# Patient Record
Sex: Male | Born: 1990 | Race: Black or African American | Hispanic: No | Marital: Single | Smoking: Never smoker
Health system: Southern US, Community
[De-identification: ages and names within clinical notes are randomized; demographics above are authoritative.]

## PROBLEM LIST (undated history)

## (undated) ENCOUNTER — Emergency Department (HOSPITAL_COMMUNITY): Admission: EM | Payer: 59 | Source: Home / Self Care

---
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
## 2013-07-24 ENCOUNTER — Emergency Department (HOSPITAL_COMMUNITY)
Admission: EM | Admit: 2013-07-24 | Discharge: 2013-07-24 | Disposition: A | Discharge status: Home / Self Care | Payer: 59 | Source: Home / Self Care | Attending: Family Medicine | Admitting: Family Medicine

## 2013-07-24 ENCOUNTER — Emergency Department (INDEPENDENT_AMBULATORY_CARE_PROVIDER_SITE_OTHER): Payer: 59

## 2013-07-24 ENCOUNTER — Encounter (HOSPITAL_COMMUNITY): Payer: Self-pay | Admitting: Emergency Medicine

## 2013-07-24 DIAGNOSIS — S93429A Sprain of deltoid ligament of unspecified ankle, initial encounter: Secondary | ICD-10-CM
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
## 2013-07-24 NOTE — Discharge Instructions (Signed)
Wear ankle support as needed for comfort, activity as tolerated. advil and crutches as needed,  see orthopedist if further problems.
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
## 2013-08-12 ENCOUNTER — Emergency Department (HOSPITAL_COMMUNITY)
Admission: EM | Admit: 2013-08-12 | Discharge: 2013-08-12 | Disposition: A | Discharge status: Home / Self Care | Payer: 59 | Attending: Emergency Medicine | Admitting: Emergency Medicine

## 2013-08-12 ENCOUNTER — Encounter (HOSPITAL_COMMUNITY): Payer: Self-pay | Admitting: Emergency Medicine

## 2013-08-12 DIAGNOSIS — IMO0001 Reserved for inherently not codable concepts without codable children: Secondary | ICD-10-CM

## 2013-08-12 DIAGNOSIS — Y929 Unspecified place or not applicable: Secondary | ICD-10-CM | POA: Insufficient documentation

## 2013-08-12 DIAGNOSIS — W261XXA Contact with sword or dagger, initial encounter: Secondary | ICD-10-CM

## 2013-08-12 DIAGNOSIS — Y93G1 Activity, food preparation and clean up: Secondary | ICD-10-CM | POA: Insufficient documentation

## 2013-08-12 DIAGNOSIS — W260XXA Contact with knife, initial encounter: Secondary | ICD-10-CM | POA: Insufficient documentation

## 2013-08-12 DIAGNOSIS — S61209A Unspecified open wound of unspecified finger without damage to nail, initial encounter: Secondary | ICD-10-CM | POA: Insufficient documentation

## 2013-08-12 NOTE — ED Notes (Signed)
PA-C Sanders at bed side to suture wound.
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
## 2013-08-12 NOTE — Discharge Instructions (Signed)
Keep sutures clean and dry. Follow up with [facility] in 1 week for suture removal. Return to the ED for new concerns.

## 2013-08-13 NOTE — ED Provider Notes (Signed)
Medical screening examination/treatment/procedure(s) were performed by non-physician practitioner and as supervising physician I was immediately available for consultation/collaboration.   EKG Interpretation None        Ezequiel Essex, MD 08/13/13 [phone]

## 2013-08-19 ENCOUNTER — Encounter (HOSPITAL_COMMUNITY): Payer: Self-pay | Admitting: Emergency Medicine

## 2013-08-19 ENCOUNTER — Emergency Department (INDEPENDENT_AMBULATORY_CARE_PROVIDER_SITE_OTHER)
Admission: EM | Admit: 2013-08-19 | Discharge: 2013-08-19 | Disposition: A | Discharge status: Home / Self Care | Payer: 59 | Source: Home / Self Care | Attending: Emergency Medicine | Admitting: Emergency Medicine

## 2013-08-19 DIAGNOSIS — T148XXA Other injury of unspecified body region, initial encounter: Principal | ICD-10-CM

## 2013-08-19 DIAGNOSIS — L089 Local infection of the skin and subcutaneous tissue, unspecified: Secondary | ICD-10-CM

## 2013-08-19 DIAGNOSIS — X58XXXA Exposure to other specified factors, initial encounter: Secondary | ICD-10-CM

## 2013-08-19 DIAGNOSIS — T07XXXA Unspecified multiple injuries, initial encounter: Secondary | ICD-10-CM

## 2013-08-19 MED ORDER — AMOXICILLIN-POT CLAVULANATE 875-125 MG PO TABS: 1.0000 | ORAL_TABLET | Freq: Two times a day (BID) | ORAL | Status: DC

## 2013-08-19 MED ORDER — MUPIROCIN 2 % EX OINT: 1.0000 "application " | TOPICAL_OINTMENT | Freq: Three times a day (TID) | CUTANEOUS | Status: DC

## 2013-08-19 MED ORDER — HYDROCODONE-ACETAMINOPHEN 5-325 MG PO TABS: ORAL_TABLET | ORAL | Status: DC
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
## 2013-08-19 NOTE — Discharge Instructions (Signed)
Wash with soap and water twice daily.  Apply antibiotic ointment and non-stick dressing.  Watch for signs of worsening infection.

## 2013-08-22 LAB — WOUND CULTURE
CULTURE: NO GROWTH
Special Requests: NORMAL

## 2014-09-02 ENCOUNTER — Emergency Department (HOSPITAL_COMMUNITY): Payer: 59

## 2014-09-02 ENCOUNTER — Emergency Department (HOSPITAL_COMMUNITY)
Admission: EM | Admit: 2014-09-02 | Discharge: 2014-09-02 | Disposition: A | Discharge status: Home / Self Care | Payer: 59 | Attending: Emergency Medicine | Admitting: Emergency Medicine

## 2014-09-02 ENCOUNTER — Encounter (HOSPITAL_COMMUNITY): Payer: Self-pay | Admitting: Emergency Medicine

## 2014-09-02 DIAGNOSIS — S51801A Unspecified open wound of right forearm, initial encounter: Secondary | ICD-10-CM | POA: Insufficient documentation

## 2014-09-02 DIAGNOSIS — Y9289 Other specified places as the place of occurrence of the external cause: Secondary | ICD-10-CM | POA: Insufficient documentation

## 2014-09-02 DIAGNOSIS — Y939 Activity, unspecified: Secondary | ICD-10-CM | POA: Insufficient documentation

## 2014-09-02 DIAGNOSIS — W3400XA Accidental discharge from unspecified firearms or gun, initial encounter: Secondary | ICD-10-CM | POA: Diagnosis not present

## 2014-09-02 DIAGNOSIS — Y999 Unspecified external cause status: Secondary | ICD-10-CM | POA: Diagnosis not present

## 2014-09-02 DIAGNOSIS — S59911A Unspecified injury of right forearm, initial encounter: Secondary | ICD-10-CM | POA: Diagnosis present

## 2014-09-02 LAB — TYPE AND SCREEN
ABO/RH(D): A POS
Antibody Screen: NEGATIVE
Unit division: 0
Unit division: 0

## 2014-09-02 LAB — COMPREHENSIVE METABOLIC PANEL
ALBUMIN: 4 g/dL (ref 3.5–5.0)
ALT: 44 U/L (ref 17–63)
AST: 33 U/L (ref 15–41)
Alkaline Phosphatase: 50 U/L (ref 38–126)
Anion gap: 15 (ref 5–15)
BILIRUBIN TOTAL: 0.5 mg/dL (ref 0.3–1.2)
BUN: 13 mg/dL (ref 6–20)
CALCIUM: 9 mg/dL (ref 8.9–10.3)
CO2: 21 mmol/L — ABNORMAL LOW (ref 22–32)
Chloride: 103 mmol/L (ref 101–111)
Creatinine, Ser: 1.41 mg/dL — ABNORMAL HIGH (ref 0.61–1.24)
Glucose, Bld: 142 mg/dL — ABNORMAL HIGH (ref 65–99)
POTASSIUM: 3.8 mmol/L (ref 3.5–5.1)
Sodium: 139 mmol/L (ref 135–145)
Total Protein: 7.1 g/dL (ref 6.5–8.1)

## 2014-09-02 LAB — PROTIME-INR
INR: 1.03 (ref 0.00–1.49)
Prothrombin Time: 13.7 seconds (ref 11.6–15.2)

## 2014-09-02 LAB — ABO/RH: ABO/RH(D): A POS

## 2014-09-02 LAB — RAPID URINE DRUG SCREEN, HOSP PERFORMED
Amphetamines: NOT DETECTED
BENZODIAZEPINES: NOT DETECTED
Barbiturates: NOT DETECTED
Cocaine: NOT DETECTED
Opiates: NOT DETECTED
Tetrahydrocannabinol: NOT DETECTED

## 2014-09-02 LAB — PREPARE FRESH FROZEN PLASMA
UNIT DIVISION: 0
Unit division: 0

## 2014-09-02 LAB — URINALYSIS, ROUTINE W REFLEX MICROSCOPIC
Bilirubin Urine: NEGATIVE
Glucose, UA: NEGATIVE mg/dL
HGB URINE DIPSTICK: NEGATIVE
Ketones, ur: NEGATIVE mg/dL
Leukocytes, UA: NEGATIVE
NITRITE: NEGATIVE
PH: 5 (ref 5.0–8.0)
PROTEIN: 30 mg/dL — AB
Specific Gravity, Urine: 1.019 (ref 1.005–1.030)
Urobilinogen, UA: 0.2 mg/dL (ref 0.0–1.0)

## 2014-09-02 LAB — CBC
HCT: 40.4 % (ref 39.0–52.0)
Hemoglobin: 13.4 g/dL (ref 13.0–17.0)
MCH: 26.3 pg (ref 26.0–34.0)
MCHC: 33.2 g/dL (ref 30.0–36.0)
MCV: 79.4 fL (ref 78.0–100.0)
PLATELETS: 208 10*3/uL (ref 150–400)
RBC: 5.09 MIL/uL (ref 4.22–5.81)
RDW: 13.4 % (ref 11.5–15.5)
WBC: 6.7 10*3/uL (ref 4.0–10.5)

## 2014-09-02 LAB — URINE MICROSCOPIC-ADD ON

## 2014-09-02 LAB — ETHANOL: Alcohol, Ethyl (B): 150 mg/dL — ABNORMAL HIGH (ref <5)

## 2014-09-02 MED ORDER — SODIUM CHLORIDE 0.9 % IV BOLUS (SEPSIS)
1000.0000 mL | Freq: Once | INTRAVENOUS | Status: AC
  Administered 2014-09-02: 1000 mL via INTRAVENOUS

## 2014-09-02 MED ORDER — CEPHALEXIN 500 MG PO CAPS: 500.0000 mg | ORAL_CAPSULE | Freq: Four times a day (QID) | ORAL | Status: DC

## 2014-09-02 NOTE — ED Notes (Addendum)
[employer] at bedside 
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
## 2014-09-02 NOTE — ED Notes (Signed)
D/c ivs
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
## 2014-09-02 NOTE — ED Notes (Signed)
[place], Ortho tech at bedside placing splint on pt
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
## 2014-09-02 NOTE — ED Notes (Signed)
Patient is resting comfortably. 

## 2014-09-05 ENCOUNTER — Encounter (HOSPITAL_COMMUNITY): Payer: Self-pay | Admitting: Emergency Medicine

## 2014-11-25 ENCOUNTER — Inpatient Hospital Stay (HOSPITAL_COMMUNITY)
Admission: EM | Admit: 2014-11-25 | Discharge: 2014-12-27 | DRG: 907 | Disposition: A | Discharge status: Rehabilitation Facility | Payer: 59 | Attending: General Surgery | Admitting: General Surgery

## 2014-11-25 ENCOUNTER — Encounter (HOSPITAL_COMMUNITY): Payer: Self-pay | Admitting: Emergency Medicine

## 2014-11-25 ENCOUNTER — Inpatient Hospital Stay (HOSPITAL_COMMUNITY): Payer: 59

## 2014-11-25 ENCOUNTER — Emergency Department (HOSPITAL_COMMUNITY): Payer: 59

## 2014-11-25 ENCOUNTER — Emergency Department (HOSPITAL_COMMUNITY): Payer: 59 | Admitting: Certified Registered"

## 2014-11-25 ENCOUNTER — Encounter (HOSPITAL_COMMUNITY): Admission: EM | Disposition: A | Discharge status: Rehabilitation Facility | Payer: 59 | Source: Home / Self Care

## 2014-11-25 DIAGNOSIS — Y838 Other surgical procedures as the cause of abnormal reaction of the patient, or of later complication, without mention of misadventure at the time of the procedure: Secondary | ICD-10-CM | POA: Diagnosis not present

## 2014-11-25 DIAGNOSIS — B9689 Other specified bacterial agents as the cause of diseases classified elsewhere: Secondary | ICD-10-CM | POA: Diagnosis not present

## 2014-11-25 DIAGNOSIS — E669 Obesity, unspecified: Secondary | ICD-10-CM | POA: Diagnosis present

## 2014-11-25 DIAGNOSIS — E871 Hypo-osmolality and hyponatremia: Secondary | ICD-10-CM | POA: Diagnosis not present

## 2014-11-25 DIAGNOSIS — T79A0XA Compartment syndrome, unspecified, initial encounter: Secondary | ICD-10-CM | POA: Diagnosis not present

## 2014-11-25 DIAGNOSIS — J9690 Respiratory failure, unspecified, unspecified whether with hypoxia or hypercapnia: Secondary | ICD-10-CM

## 2014-11-25 DIAGNOSIS — R34 Anuria and oliguria: Secondary | ICD-10-CM | POA: Diagnosis not present

## 2014-11-25 DIAGNOSIS — N179 Acute kidney failure, unspecified: Secondary | ICD-10-CM | POA: Diagnosis not present

## 2014-11-25 DIAGNOSIS — R609 Edema, unspecified: Secondary | ICD-10-CM | POA: Diagnosis not present

## 2014-11-25 DIAGNOSIS — R821 Myoglobinuria: Secondary | ICD-10-CM | POA: Diagnosis not present

## 2014-11-25 DIAGNOSIS — K567 Ileus, unspecified: Secondary | ICD-10-CM | POA: Diagnosis not present

## 2014-11-25 DIAGNOSIS — I82611 Acute embolism and thrombosis of superficial veins of right upper extremity: Secondary | ICD-10-CM | POA: Diagnosis not present

## 2014-11-25 DIAGNOSIS — W3400XA Accidental discharge from unspecified firearms or gun, initial encounter: Secondary | ICD-10-CM | POA: Diagnosis not present

## 2014-11-25 DIAGNOSIS — T794XXA Traumatic shock, initial encounter: Secondary | ICD-10-CM | POA: Diagnosis present

## 2014-11-25 DIAGNOSIS — E873 Alkalosis: Secondary | ICD-10-CM | POA: Diagnosis present

## 2014-11-25 DIAGNOSIS — J9601 Acute respiratory failure with hypoxia: Secondary | ICD-10-CM | POA: Diagnosis present

## 2014-11-25 DIAGNOSIS — S32599A Other specified fracture of unspecified pubis, initial encounter for closed fracture: Secondary | ICD-10-CM | POA: Diagnosis present

## 2014-11-25 DIAGNOSIS — S75022A Major laceration of femoral artery, left leg, initial encounter: Secondary | ICD-10-CM | POA: Diagnosis present

## 2014-11-25 DIAGNOSIS — D62 Acute posthemorrhagic anemia: Secondary | ICD-10-CM | POA: Diagnosis present

## 2014-11-25 DIAGNOSIS — Z992 Dependence on renal dialysis: Secondary | ICD-10-CM | POA: Diagnosis not present

## 2014-11-25 DIAGNOSIS — N170 Acute kidney failure with tubular necrosis: Secondary | ICD-10-CM | POA: Diagnosis present

## 2014-11-25 DIAGNOSIS — R579 Shock, unspecified: Secondary | ICD-10-CM | POA: Diagnosis not present

## 2014-11-25 DIAGNOSIS — S75102A Unspecified injury of femoral vein at hip and thigh level, left leg, initial encounter: Secondary | ICD-10-CM | POA: Diagnosis present

## 2014-11-25 DIAGNOSIS — Y92481 Parking lot as the place of occurrence of the external cause: Secondary | ICD-10-CM | POA: Diagnosis not present

## 2014-11-25 DIAGNOSIS — S0181XA Laceration without foreign body of other part of head, initial encounter: Secondary | ICD-10-CM | POA: Diagnosis present

## 2014-11-25 DIAGNOSIS — T79A22A Traumatic compartment syndrome of left lower extremity, initial encounter: Secondary | ICD-10-CM | POA: Diagnosis not present

## 2014-11-25 DIAGNOSIS — R110 Nausea: Secondary | ICD-10-CM | POA: Diagnosis not present

## 2014-11-25 DIAGNOSIS — S299XXA Unspecified injury of thorax, initial encounter: Secondary | ICD-10-CM

## 2014-11-25 DIAGNOSIS — D696 Thrombocytopenia, unspecified: Secondary | ICD-10-CM | POA: Diagnosis present

## 2014-11-25 DIAGNOSIS — N186 End stage renal disease: Secondary | ICD-10-CM | POA: Diagnosis not present

## 2014-11-25 DIAGNOSIS — E876 Hypokalemia: Secondary | ICD-10-CM | POA: Diagnosis present

## 2014-11-25 DIAGNOSIS — E874 Mixed disorder of acid-base balance: Secondary | ICD-10-CM | POA: Diagnosis not present

## 2014-11-25 DIAGNOSIS — D638 Anemia in other chronic diseases classified elsewhere: Secondary | ICD-10-CM | POA: Diagnosis present

## 2014-11-25 DIAGNOSIS — Z95828 Presence of other vascular implants and grafts: Secondary | ICD-10-CM

## 2014-11-25 DIAGNOSIS — I9789 Other postprocedural complications and disorders of the circulatory system, not elsewhere classified: Secondary | ICD-10-CM | POA: Diagnosis not present

## 2014-11-25 DIAGNOSIS — Z419 Encounter for procedure for purposes other than remedying health state, unspecified: Secondary | ICD-10-CM

## 2014-11-25 DIAGNOSIS — S75122A Major laceration of femoral vein at hip and thigh level, left leg, initial encounter: Principal | ICD-10-CM | POA: Diagnosis present

## 2014-11-25 DIAGNOSIS — D6489 Other specified anemias: Secondary | ICD-10-CM | POA: Diagnosis present

## 2014-11-25 DIAGNOSIS — M6282 Rhabdomyolysis: Secondary | ICD-10-CM | POA: Diagnosis not present

## 2014-11-25 DIAGNOSIS — R000 Tachycardia, unspecified: Secondary | ICD-10-CM | POA: Diagnosis not present

## 2014-11-25 DIAGNOSIS — Z6823 Body mass index (BMI) 23.0-23.9, adult: Secondary | ICD-10-CM | POA: Diagnosis not present

## 2014-11-25 DIAGNOSIS — T82898A Other specified complication of vascular prosthetic devices, implants and grafts, initial encounter: Secondary | ICD-10-CM | POA: Diagnosis not present

## 2014-11-25 DIAGNOSIS — R509 Fever, unspecified: Secondary | ICD-10-CM

## 2014-11-25 DIAGNOSIS — S31139A Puncture wound of abdominal wall without foreign body, unspecified quadrant without penetration into peritoneal cavity, initial encounter: Secondary | ICD-10-CM

## 2014-11-25 DIAGNOSIS — R197 Diarrhea, unspecified: Secondary | ICD-10-CM | POA: Diagnosis not present

## 2014-11-25 DIAGNOSIS — S75009A Unspecified injury of femoral artery, unspecified leg, initial encounter: Secondary | ICD-10-CM | POA: Diagnosis present

## 2014-11-25 DIAGNOSIS — I10 Essential (primary) hypertension: Secondary | ICD-10-CM | POA: Diagnosis not present

## 2014-11-25 DIAGNOSIS — Z9289 Personal history of other medical treatment: Secondary | ICD-10-CM

## 2014-11-25 DIAGNOSIS — R7881 Bacteremia: Secondary | ICD-10-CM | POA: Diagnosis not present

## 2014-11-25 DIAGNOSIS — S31109D Unspecified open wound of abdominal wall, unspecified quadrant without penetration into peritoneal cavity, subsequent encounter: Secondary | ICD-10-CM | POA: Diagnosis not present

## 2014-11-25 DIAGNOSIS — Z89612 Acquired absence of left leg above knee: Secondary | ICD-10-CM | POA: Diagnosis not present

## 2014-11-25 DIAGNOSIS — A419 Sepsis, unspecified organism: Secondary | ICD-10-CM | POA: Diagnosis not present

## 2014-11-25 DIAGNOSIS — S31109S Unspecified open wound of abdominal wall, unspecified quadrant without penetration into peritoneal cavity, sequela: Secondary | ICD-10-CM | POA: Diagnosis not present

## 2014-11-25 DIAGNOSIS — W3400XS Accidental discharge from unspecified firearms or gun, sequela: Secondary | ICD-10-CM | POA: Diagnosis not present

## 2014-11-25 DIAGNOSIS — W3400XD Accidental discharge from unspecified firearms or gun, subsequent encounter: Secondary | ICD-10-CM | POA: Diagnosis not present

## 2014-11-25 DIAGNOSIS — N178 Other acute kidney failure: Secondary | ICD-10-CM | POA: Diagnosis not present

## 2014-11-25 DIAGNOSIS — Z978 Presence of other specified devices: Secondary | ICD-10-CM

## 2014-11-25 DIAGNOSIS — T827XXA Infection and inflammatory reaction due to other cardiac and vascular devices, implants and grafts, initial encounter: Secondary | ICD-10-CM | POA: Diagnosis not present

## 2014-11-25 DIAGNOSIS — R578 Other shock: Secondary | ICD-10-CM | POA: Diagnosis present

## 2014-11-25 HISTORY — PX: FEMORAL-FEMORAL BYPASS GRAFT: SHX936

## 2014-11-25 HISTORY — PX: FACIAL LACERATION REPAIR: SHX6589

## 2014-11-25 HISTORY — PX: FEMORAL ARTERY EXPLORATION: SHX5160

## 2014-11-25 LAB — I-STAT CHEM 8, ED
BUN: 10 mg/dL (ref 6–20)
Calcium, Ion: 1.17 mmol/L (ref 1.12–1.23)
Chloride: 109 mmol/L (ref 101–111)
Creatinine, Ser: 1.7 mg/dL — ABNORMAL HIGH (ref 0.61–1.24)
GLUCOSE: 238 mg/dL — AB (ref 65–99)
HEMATOCRIT: 41 % (ref 39.0–52.0)
HEMOGLOBIN: 13.9 g/dL (ref 13.0–17.0)
POTASSIUM: 4.1 mmol/L (ref 3.5–5.1)
SODIUM: 147 mmol/L — AB (ref 135–145)
TCO2: 9 mmol/L (ref 0–100)

## 2014-11-25 LAB — TYPE AND SCREEN
ABO/RH(D): A POS
Antibody Screen: NEGATIVE

## 2014-11-25 LAB — POCT I-STAT 3, ART BLOOD GAS (G3+)
Acid-Base Excess: 6 mmol/L — ABNORMAL HIGH (ref 0.0–2.0)
Acid-Base Excess: 6 mmol/L — ABNORMAL HIGH (ref 0.0–2.0)
BICARBONATE: 27.9 meq/L — AB (ref 20.0–24.0)
Bicarbonate: 29.1 meq/L — ABNORMAL HIGH (ref 20.0–24.0)
O2 Saturation: 100 %
O2 Saturation: 100 %
PCO2 ART: 33.8 mmHg — AB (ref 35.0–45.0)
PH ART: 7.542 — AB (ref 7.350–7.450)
PO2 ART: 151 mmHg — AB (ref 80.0–100.0)
Patient temperature: 98.6
TCO2: 29 mmol/L (ref 0–100)
TCO2: 30 mmol/L (ref 0–100)
pCO2 arterial: 30.1 mmHg — ABNORMAL LOW (ref 35.0–45.0)
pH, Arterial: 7.572 — ABNORMAL HIGH (ref 7.350–7.450)
pO2, Arterial: 319 mmHg — ABNORMAL HIGH (ref 80.0–100.0)

## 2014-11-25 LAB — POCT I-STAT 7, (LYTES, BLD GAS, ICA,H+H)
ACID-BASE DEFICIT: 17 mmol/L — AB (ref 0.0–2.0)
ACID-BASE DEFICIT: 9 mmol/L — AB (ref 0.0–2.0)
Acid-base deficit: 2 mmol/L (ref 0.0–2.0)
BICARBONATE: 11.6 meq/L — AB (ref 20.0–24.0)
BICARBONATE: 17.4 meq/L — AB (ref 20.0–24.0)
BICARBONATE: 21.9 meq/L (ref 20.0–24.0)
CALCIUM ION: 0.72 mmol/L — AB (ref 1.12–1.23)
CALCIUM ION: 0.87 mmol/L — AB (ref 1.12–1.23)
CALCIUM ION: 0.92 mmol/L — AB (ref 1.12–1.23)
HCT: 23 % — ABNORMAL LOW (ref 39.0–52.0)
HEMATOCRIT: 29 % — AB (ref 39.0–52.0)
HEMATOCRIT: 25 % — AB (ref 39.0–52.0)
Hemoglobin: 7.8 g/dL — ABNORMAL LOW (ref 13.0–17.0)
Hemoglobin: 9.9 g/dL — ABNORMAL LOW (ref 13.0–17.0)
Hemoglobin: 8.5 g/dL — ABNORMAL LOW (ref 13.0–17.0)
O2 SAT: 100 %
O2 SAT: 99 %
O2 Saturation: 100 %
PCO2 ART: 39 mmHg (ref 35.0–45.0)
PCO2 ART: 34.2 mmHg — AB (ref 35.0–45.0)
PH ART: 7.077 — AB (ref 7.350–7.450)
PH ART: 7.246 — AB (ref 7.350–7.450)
PO2 ART: 420 mmHg — AB (ref 80.0–100.0)
POTASSIUM: 5.4 mmol/L — AB (ref 3.5–5.1)
POTASSIUM: 3.5 mmol/L (ref 3.5–5.1)
Patient temperature: 36.4
Potassium: 5.1 mmol/L (ref 3.5–5.1)
SODIUM: 141 mmol/L (ref 135–145)
Sodium: 140 mmol/L (ref 135–145)
Sodium: 144 mmol/L (ref 135–145)
TCO2: 13 mmol/L (ref 0–100)
TCO2: 19 mmol/L (ref 0–100)
TCO2: 23 mmol/L (ref 0–100)
pCO2 arterial: 39.7 mmHg (ref 35.0–45.0)
pH, Arterial: 7.412 (ref 7.350–7.450)
pO2, Arterial: 424 mmHg — ABNORMAL HIGH (ref 80.0–100.0)
pO2, Arterial: 155 mmHg — ABNORMAL HIGH (ref 80.0–100.0)

## 2014-11-25 LAB — CBC
HCT: 33.6 % — ABNORMAL LOW (ref 39.0–52.0)
HCT: 33.9 % — ABNORMAL LOW (ref 39.0–52.0)
HEMATOCRIT: 40 % (ref 39.0–52.0)
HEMOGLOBIN: 11.7 g/dL — AB (ref 13.0–17.0)
HEMOGLOBIN: 11.9 g/dL — AB (ref 13.0–17.0)
HEMOGLOBIN: 12.3 g/dL — AB (ref 13.0–17.0)
MCH: 25.9 pg — ABNORMAL LOW (ref 26.0–34.0)
MCH: 29.8 pg (ref 26.0–34.0)
MCH: 30.1 pg (ref 26.0–34.0)
MCHC: 29.3 g/dL — ABNORMAL LOW (ref 30.0–36.0)
MCHC: 35.4 g/dL (ref 30.0–36.0)
MCHC: 36.3 g/dL — ABNORMAL HIGH (ref 30.0–36.0)
MCV: 88.5 fL (ref 78.0–100.0)
MCV: 84 fL (ref 78.0–100.0)
MCV: 82.9 fL (ref 78.0–100.0)
PLATELETS: 147 10*3/uL — AB (ref 150–400)
Platelets: 73 10*3/uL — ABNORMAL LOW (ref 150–400)
Platelets: 66 10*3/uL — ABNORMAL LOW (ref 150–400)
RBC: 4.52 MIL/uL (ref 4.22–5.81)
RBC: 4 MIL/uL — AB (ref 4.22–5.81)
RBC: 4.09 MIL/uL — AB (ref 4.22–5.81)
RDW: 14 % (ref 11.5–15.5)
RDW: 13.9 % (ref 11.5–15.5)
RDW: 13.9 % (ref 11.5–15.5)
WBC: 5.3 10*3/uL (ref 4.0–10.5)
WBC: 12.6 10*3/uL — ABNORMAL HIGH (ref 4.0–10.5)
WBC: 11.4 10*3/uL — ABNORMAL HIGH (ref 4.0–10.5)

## 2014-11-25 LAB — BASIC METABOLIC PANEL
Anion gap: 14 (ref 5–15)
BUN: 11 mg/dL (ref 6–20)
CHLORIDE: 105 mmol/L (ref 101–111)
CO2: 23 mmol/L (ref 22–32)
CREATININE: 2.34 mg/dL — AB (ref 0.61–1.24)
Calcium: 8.7 mg/dL — ABNORMAL LOW (ref 8.9–10.3)
GFR calc Af Amer: 43 mL/min — ABNORMAL LOW (ref >60)
GFR calc non Af Amer: 38 mL/min — ABNORMAL LOW (ref >60)
Glucose, Bld: 85 mg/dL (ref 65–99)
POTASSIUM: 3.6 mmol/L (ref 3.5–5.1)
Sodium: 142 mmol/L (ref 135–145)

## 2014-11-25 LAB — DIC (DISSEMINATED INTRAVASCULAR COAGULATION)PANEL
INR: 2.2 — ABNORMAL HIGH (ref 0.00–1.49)
aPTT: 97 seconds — ABNORMAL HIGH (ref 24–37)

## 2014-11-25 LAB — DIC (DISSEMINATED INTRAVASCULAR COAGULATION) PANEL
FIBRINOGEN: 113 mg/dL — AB (ref 204–475)
PLATELETS: 106 10*3/uL — AB (ref 150–400)
PROTHROMBIN TIME: 24.2 s — AB (ref 11.6–15.2)
SMEAR REVIEW: NONE SEEN

## 2014-11-25 LAB — COMPREHENSIVE METABOLIC PANEL
ALT: 31 U/L (ref 17–63)
ANION GAP: 34 — AB (ref 5–15)
AST: 56 U/L — ABNORMAL HIGH (ref 15–41)
Albumin: 3.5 g/dL (ref 3.5–5.0)
Alkaline Phosphatase: 45 U/L (ref 38–126)
BUN: 10 mg/dL (ref 6–20)
CHLORIDE: 108 mmol/L (ref 101–111)
CO2: 8 mmol/L — ABNORMAL LOW (ref 22–32)
CREATININE: 1.84 mg/dL — AB (ref 0.61–1.24)
Calcium: 10 mg/dL (ref 8.9–10.3)
GFR, EST AFRICAN AMERICAN: 58 mL/min — AB (ref >60)
GFR, EST NON AFRICAN AMERICAN: 50 mL/min — AB (ref >60)
Glucose, Bld: 251 mg/dL — ABNORMAL HIGH (ref 65–99)
POTASSIUM: 4.3 mmol/L (ref 3.5–5.1)
SODIUM: 150 mmol/L — AB (ref 135–145)
Total Bilirubin: 0.4 mg/dL (ref 0.3–1.2)
Total Protein: 5.8 g/dL — ABNORMAL LOW (ref 6.5–8.1)

## 2014-11-25 LAB — ETHANOL: Alcohol, Ethyl (B): 69 mg/dL — ABNORMAL HIGH (ref <5)

## 2014-11-25 LAB — MASSIVE TRANSFUSION PROTOCOL ORDER (BLOOD BANK NOTIFICATION)

## 2014-11-25 LAB — PROTIME-INR
INR: 2.03 — AB (ref 0.00–1.49)
INR: 1.38 (ref 0.00–1.49)
Prothrombin Time: 22.8 seconds — ABNORMAL HIGH (ref 11.6–15.2)
Prothrombin Time: 17 seconds — ABNORMAL HIGH (ref 11.6–15.2)

## 2014-11-25 LAB — MRSA PCR SCREENING: MRSA by PCR: NEGATIVE

## 2014-11-25 LAB — CDS SEROLOGY

## 2014-11-25 LAB — ABO/RH: ABO/RH(D): A POS

## 2014-11-25 LAB — TRIGLYCERIDES: Triglycerides: 75 mg/dL (ref <150)

## 2014-11-25 SURGERY — CREATION, BYPASS, ARTERIAL, FEMORAL TO FEMORAL, USING GRAFT
Anesthesia: General | Site: Leg Upper

## 2014-11-25 MED ORDER — MIDAZOLAM HCL 2 MG/2ML IJ SOLN
INTRAMUSCULAR | Status: AC
  Filled 2014-11-25: qty 4

## 2014-11-25 MED ORDER — ROCURONIUM BROMIDE 50 MG/5ML IV SOLN
INTRAVENOUS | Status: AC
  Filled 2014-11-25: qty 4

## 2014-11-25 MED ORDER — ANTISEPTIC ORAL RINSE SOLUTION (CORINZ)
7.0000 mL | Freq: Four times a day (QID) | OROMUCOSAL | Status: DC
  Administered 2014-11-25 – 2014-11-26 (×4): 7 mL via OROMUCOSAL

## 2014-11-25 MED ORDER — ROCURONIUM BROMIDE 50 MG/5ML IV SOLN
INTRAVENOUS | Status: AC
  Filled 2014-11-25: qty 1

## 2014-11-25 MED ORDER — LIDOCAINE HCL (CARDIAC) 20 MG/ML IV SOLN
INTRAVENOUS | Status: AC
  Filled 2014-11-25: qty 5

## 2014-11-25 MED ORDER — SODIUM CHLORIDE 0.9 % IV SOLN
25.0000 ug/h | INTRAVENOUS | Status: DC
  Administered 2014-11-25: 300 ug/h via INTRAVENOUS
  Administered 2014-11-25: 200 ug/h via INTRAVENOUS
  Administered 2014-11-26 – 2014-11-28 (×8): 300 ug/h via INTRAVENOUS
  Administered 2014-11-29 (×2): 175 ug/h via INTRAVENOUS
  Administered 2014-11-29: 350 ug/h via INTRAVENOUS
  Administered 2014-11-30: 250 ug/h via INTRAVENOUS
  Administered 2014-11-30: 125 ug/h via INTRAVENOUS
  Administered 2014-12-01 – 2014-12-02 (×3): 250 ug/h via INTRAVENOUS
  Administered 2014-12-03: 200 ug/h via INTRAVENOUS
  Administered 2014-12-03: 250 ug/h via INTRAVENOUS
  Administered 2014-12-03: 200 ug/h via INTRAVENOUS
  Administered 2014-12-04: 250 ug/h via INTRAVENOUS
  Administered 2014-12-04: 300 ug/h via INTRAVENOUS
  Administered 2014-12-05 – 2014-12-07 (×8): 400 ug/h via INTRAVENOUS
  Administered 2014-12-07: 300 ug/h via INTRAVENOUS
  Administered 2014-12-08 – 2014-12-09 (×4): 400 ug/h via INTRAVENOUS
  Filled 2014-11-25 (×41): qty 50

## 2014-11-25 MED ORDER — SUCCINYLCHOLINE CHLORIDE 20 MG/ML IJ SOLN
INTRAMUSCULAR | Status: AC | PRN
  Administered 2014-11-25: 100 mg via INTRAVENOUS

## 2014-11-25 MED ORDER — ONDANSETRON HCL 4 MG PO TABS: 4.0000 mg | ORAL_TABLET | Freq: Four times a day (QID) | ORAL | Status: DC | PRN

## 2014-11-25 MED ORDER — FENTANYL CITRATE (PF) 250 MCG/5ML IJ SOLN
INTRAMUSCULAR | Status: AC
  Filled 2014-11-25: qty 5

## 2014-11-25 MED ORDER — ETOMIDATE 2 MG/ML IV SOLN
INTRAVENOUS | Status: AC | PRN
  Administered 2014-11-25: 25 mg via INTRAVENOUS

## 2014-11-25 MED ORDER — CEFAZOLIN SODIUM-DEXTROSE 2-3 GM-% IV SOLR
INTRAVENOUS | Status: AC
  Filled 2014-11-25: qty 50

## 2014-11-25 MED ORDER — PANTOPRAZOLE SODIUM 40 MG IV SOLR
40.0000 mg | Freq: Every day | INTRAVENOUS | Status: DC
  Administered 2014-11-25 – 2014-12-12 (×17): 40 mg via INTRAVENOUS
  Filled 2014-11-25 (×20): qty 40

## 2014-11-25 MED ORDER — ALBUMIN HUMAN 5 % IV SOLN
INTRAVENOUS | Status: DC | PRN
  Administered 2014-11-25: 04:00:00 via INTRAVENOUS

## 2014-11-25 MED ORDER — ETOMIDATE 2 MG/ML IV SOLN
INTRAVENOUS | Status: AC
  Filled 2014-11-25: qty 20

## 2014-11-25 MED ORDER — SODIUM CHLORIDE 0.9 % IJ SOLN
INTRAMUSCULAR | Status: AC
  Filled 2014-11-25: qty 10

## 2014-11-25 MED ORDER — SODIUM CHLORIDE 0.9 % IV SOLN
INTRAVENOUS | Status: DC | PRN
  Administered 2014-11-25 (×5): via INTRAVENOUS

## 2014-11-25 MED ORDER — CEFAZOLIN SODIUM-DEXTROSE 2-3 GM-% IV SOLR
INTRAVENOUS | Status: DC | PRN
  Administered 2014-11-25 (×2): 2 g via INTRAVENOUS

## 2014-11-25 MED ORDER — CALCIUM CHLORIDE 10 % IV SOLN
INTRAVENOUS | Status: DC | PRN
  Administered 2014-11-25 (×3): 1 g via INTRAVENOUS

## 2014-11-25 MED ORDER — PROTAMINE SULFATE 10 MG/ML IV SOLN
INTRAVENOUS | Status: DC | PRN
  Administered 2014-11-25 (×5): 10 mg via INTRAVENOUS

## 2014-11-25 MED ORDER — SODIUM CHLORIDE 0.9 % IV SOLN
INTRAVENOUS | Status: AC | PRN
  Administered 2014-11-25 (×2): 999 mL/h via INTRAVENOUS

## 2014-11-25 MED ORDER — CHLORHEXIDINE GLUCONATE 0.12% ORAL RINSE (MEDLINE KIT)
15.0000 mL | Freq: Two times a day (BID) | OROMUCOSAL | Status: DC
  Administered 2014-11-25 – 2014-12-13 (×35): 15 mL via OROMUCOSAL

## 2014-11-25 MED ORDER — BISACODYL 10 MG RE SUPP: 10.0000 mg | Freq: Every day | RECTAL | Status: DC | PRN

## 2014-11-25 MED ORDER — ONDANSETRON HCL 4 MG/2ML IJ SOLN
4.0000 mg | Freq: Four times a day (QID) | INTRAMUSCULAR | Status: DC | PRN
  Administered 2014-11-29 – 2014-12-19 (×10): 4 mg via INTRAVENOUS
  Filled 2014-11-25 (×10): qty 2

## 2014-11-25 MED ORDER — SUCCINYLCHOLINE CHLORIDE 20 MG/ML IJ SOLN
INTRAMUSCULAR | Status: AC
  Filled 2014-11-25: qty 1

## 2014-11-25 MED ORDER — THROMBIN 20000 UNITS EX SOLR
CUTANEOUS | Status: AC
  Filled 2014-11-25: qty 20000

## 2014-11-25 MED ORDER — FENTANYL BOLUS VIA INFUSION
50.0000 ug | INTRAVENOUS | Status: DC | PRN
  Administered 2014-11-29 – 2014-11-30 (×3): 50 ug via INTRAVENOUS
  Administered 2014-11-30: 100 ug via INTRAVENOUS
  Administered 2014-12-02 – 2014-12-04 (×3): 50 ug via INTRAVENOUS
  Administered 2014-12-04: 100 ug via INTRAVENOUS
  Administered 2014-12-04: 150 ug via INTRAVENOUS
  Administered 2014-12-07 – 2014-12-08 (×3): 50 ug via INTRAVENOUS
  Filled 2014-11-25 (×3): qty 50

## 2014-11-25 MED ORDER — DEXTROSE IN LACTATED RINGERS 5 % IV SOLN
INTRAVENOUS | Status: DC
  Administered 2014-11-25 (×2): via INTRAVENOUS

## 2014-11-25 MED ORDER — ARTIFICIAL TEARS OP OINT
TOPICAL_OINTMENT | OPHTHALMIC | Status: DC | PRN
  Administered 2014-11-25: 1 via OPHTHALMIC

## 2014-11-25 MED ORDER — PHENYLEPHRINE HCL 10 MG/ML IJ SOLN
10.0000 mg | INTRAVENOUS | Status: DC | PRN
  Administered 2014-11-25: 50 ug/min via INTRAVENOUS

## 2014-11-25 MED ORDER — CHLORHEXIDINE GLUCONATE 0.12% ORAL RINSE (MEDLINE KIT)
15.0000 mL | Freq: Two times a day (BID) | OROMUCOSAL | Status: DC
Start: 2014-11-25 — End: 2014-11-26
  Administered 2014-11-25 – 2014-11-26 (×2): 15 mL via OROMUCOSAL

## 2014-11-25 MED ORDER — 0.9 % SODIUM CHLORIDE (POUR BTL) OPTIME
TOPICAL | Status: DC | PRN
  Administered 2014-11-25: 2000 mL
  Administered 2014-11-25: 1000 mL

## 2014-11-25 MED ORDER — PROPOFOL 10 MG/ML IV BOLUS
INTRAVENOUS | Status: AC
  Filled 2014-11-25: qty 20

## 2014-11-25 MED ORDER — HEPARIN SODIUM (PORCINE) 1000 UNIT/ML IJ SOLN
INTRAMUSCULAR | Status: DC | PRN
  Administered 2014-11-25: 10000 [IU] via INTRAVENOUS

## 2014-11-25 MED ORDER — PROPOFOL 1000 MG/100ML IV EMUL
0.0000 ug/kg/min | INTRAVENOUS | Status: DC
  Administered 2014-11-25: 30 ug/kg/min via INTRAVENOUS
  Administered 2014-11-25 – 2014-11-28 (×23): 50 ug/kg/min via INTRAVENOUS
  Filled 2014-11-25 (×24): qty 100

## 2014-11-25 MED ORDER — FENTANYL CITRATE (PF) 100 MCG/2ML IJ SOLN: 50.0000 ug | Freq: Once | INTRAMUSCULAR | Status: DC

## 2014-11-25 MED ORDER — SODIUM CHLORIDE 0.9 % IR SOLN
Status: DC | PRN
  Administered 2014-11-25: 500 mL

## 2014-11-25 MED ORDER — SODIUM BICARBONATE 8.4 % IV SOLN
INTRAVENOUS | Status: DC | PRN
  Administered 2014-11-25 (×2): 100 meq via INTRAVENOUS

## 2014-11-25 MED ORDER — ANTISEPTIC ORAL RINSE SOLUTION (CORINZ)
7.0000 mL | Freq: Four times a day (QID) | OROMUCOSAL | Status: DC
  Administered 2014-11-26 – 2014-11-30 (×20): 7 mL via OROMUCOSAL

## 2014-11-25 MED ORDER — DEXMEDETOMIDINE HCL IN NACL 200 MCG/50ML IV SOLN
INTRAVENOUS | Status: DC | PRN
  Administered 2014-11-25: 0.7 ug/kg/h via INTRAVENOUS

## 2014-11-25 MED ORDER — PANTOPRAZOLE SODIUM 40 MG PO TBEC
40.0000 mg | DELAYED_RELEASE_TABLET | Freq: Every day | ORAL | Status: DC
  Filled 2014-11-25: qty 1

## 2014-11-25 MED ORDER — LACTATED RINGERS IV SOLN
INTRAVENOUS | Status: DC | PRN
  Administered 2014-11-25 (×3): via INTRAVENOUS

## 2014-11-25 MED ORDER — ROCURONIUM BROMIDE 100 MG/10ML IV SOLN
INTRAVENOUS | Status: DC | PRN
  Administered 2014-11-25 (×4): 50 mg via INTRAVENOUS

## 2014-11-25 MED ORDER — FENTANYL CITRATE (PF) 100 MCG/2ML IJ SOLN
INTRAMUSCULAR | Status: DC | PRN
  Administered 2014-11-25 (×5): 50 ug via INTRAVENOUS

## 2014-11-25 MED ORDER — MIDAZOLAM HCL 5 MG/5ML IJ SOLN
INTRAMUSCULAR | Status: DC | PRN
  Administered 2014-11-25: 2 mg via INTRAVENOUS

## 2014-11-25 MED ORDER — EPHEDRINE SULFATE 50 MG/ML IJ SOLN
INTRAMUSCULAR | Status: AC
  Filled 2014-11-25: qty 1

## 2014-11-25 MED ORDER — ROCURONIUM BROMIDE 50 MG/5ML IV SOLN
INTRAVENOUS | Status: AC
  Filled 2014-11-25: qty 2

## 2014-11-25 MED ORDER — GELATIN ABSORBABLE MT POWD
OROMUCOSAL | Status: DC | PRN
  Administered 2014-11-25 (×3): 20 mL via TOPICAL

## 2014-11-25 SURGICAL SUPPLY — 86 items
BLADE SURG ROTATE 9660 (MISCELLANEOUS) IMPLANT
BOOT SUTURE AID YELLOW STND (SUTURE) ×3 IMPLANT
CANISTER SUCTION 2500CC (MISCELLANEOUS) ×5 IMPLANT
CANNULA VESSEL 3MM 2 BLNT TIP (CANNULA) ×6 IMPLANT
CHLORAPREP W/TINT 26ML (MISCELLANEOUS) ×5 IMPLANT
CLIP TI LARGE 6 (CLIP) ×6 IMPLANT
CLIP TI WIDE RED SMALL 24 (CLIP) ×6 IMPLANT
COVER MAYO STAND STRL (DRAPES) IMPLANT
COVER PROBE W GEL 5X96 (DRAPES) ×3 IMPLANT
COVER SURGICAL LIGHT HANDLE (MISCELLANEOUS) ×5 IMPLANT
DRAIN WOUND SNY 15 RND (WOUND CARE) ×6 IMPLANT
DRAPE LAPAROSCOPIC ABDOMINAL (DRAPES) ×5 IMPLANT
DRAPE ORTHO SPLIT 77X108 STRL (DRAPES) ×5
DRAPE PROXIMA HALF (DRAPES) IMPLANT
DRAPE SURG ORHT 6 SPLT 77X108 (DRAPES) ×1 IMPLANT
DRAPE UTILITY XL STRL (DRAPES) ×10 IMPLANT
DRAPE WARM FLUID 44X44 (DRAPE) ×5 IMPLANT
DRSG COVADERM 4X8 (GAUZE/BANDAGES/DRESSINGS) ×3 IMPLANT
DRSG OPSITE POSTOP 4X10 (GAUZE/BANDAGES/DRESSINGS) IMPLANT
DRSG OPSITE POSTOP 4X8 (GAUZE/BANDAGES/DRESSINGS) IMPLANT
ELECT BLADE 6.5 EXT (BLADE) IMPLANT
ELECT CAUTERY BLADE 6.4 (BLADE) ×10 IMPLANT
ELECT REM PT RETURN 9FT ADLT (ELECTROSURGICAL) ×5
ELECTRODE REM PT RTRN 9FT ADLT (ELECTROSURGICAL) ×3 IMPLANT
EVACUATOR SILICONE 100CC (DRAIN) ×6 IMPLANT
GAUZE SPONGE 4X4 16PLY XRAY LF (GAUZE/BANDAGES/DRESSINGS) ×3 IMPLANT
GLOVE BIOGEL M STRL SZ7.5 (GLOVE) ×5 IMPLANT
GLOVE BIOGEL PI IND STRL 8 (GLOVE) ×3 IMPLANT
GLOVE BIOGEL PI INDICATOR 8 (GLOVE) ×2
GOWN STRL REUS W/ TWL LRG LVL3 (GOWN DISPOSABLE) ×6 IMPLANT
GOWN STRL REUS W/ TWL XL LVL3 (GOWN DISPOSABLE) ×3 IMPLANT
GOWN STRL REUS W/TWL LRG LVL3 (GOWN DISPOSABLE) ×10
GOWN STRL REUS W/TWL XL LVL3 (GOWN DISPOSABLE) ×5
GRAFT PROPATEN THIN WALL 6X40 (Vascular Products) ×3 IMPLANT
KIT BASIN OR (CUSTOM PROCEDURE TRAY) ×5 IMPLANT
KIT ROOM TURNOVER OR (KITS) ×5 IMPLANT
LIGASURE IMPACT 36 18CM CVD LR (INSTRUMENTS) IMPLANT
LOOP VESSEL MAXI BLUE (MISCELLANEOUS) ×6 IMPLANT
LOOP VESSEL MINI RED (MISCELLANEOUS) ×6 IMPLANT
MANIFOLD NEPTUNE II (INSTRUMENTS) ×3 IMPLANT
MARKER SKIN DUAL TIP RULER LAB (MISCELLANEOUS) ×3 IMPLANT
NS IRRIG 1000ML POUR BTL (IV SOLUTION) ×10 IMPLANT
PACK GENERAL/GYN (CUSTOM PROCEDURE TRAY) ×5 IMPLANT
PAD ARMBOARD 7.5X6 YLW CONV (MISCELLANEOUS) ×5 IMPLANT
PENCIL BUTTON HOLSTER BLD 10FT (ELECTRODE) IMPLANT
SPECIMEN JAR LARGE (MISCELLANEOUS) IMPLANT
SPONGE GAUZE 4X4 12PLY STER LF (GAUZE/BANDAGES/DRESSINGS) ×3 IMPLANT
SPONGE LAP 18X18 X RAY DECT (DISPOSABLE) ×3 IMPLANT
SPONGE LAP 4X18 X RAY DECT (DISPOSABLE) ×6 IMPLANT
STAPLER VISISTAT 35W (STAPLE) ×5 IMPLANT
STOPCOCK 4 WAY LG BORE MALE ST (IV SETS) ×3 IMPLANT
SUCTION POOLE TIP (SUCTIONS) ×5 IMPLANT
SUT ETHILON 3 0 PS 1 (SUTURE) ×3 IMPLANT
SUT GORETEX 6.0 TT13 (SUTURE) ×6 IMPLANT
SUT PDS AB 1 TP1 96 (SUTURE) ×10 IMPLANT
SUT PROLENE 5 0 C 1 24 (SUTURE) ×3 IMPLANT
SUT PROLENE 5 0 CC1 (SUTURE) ×3 IMPLANT
SUT PROLENE 6 0 BV (SUTURE) ×12 IMPLANT
SUT PROLENE 6 0 C 1 30 (SUTURE) ×3 IMPLANT
SUT PROLENE 7 0 BV1 MDA (SUTURE) ×3 IMPLANT
SUT SILK 2 0 (SUTURE) ×5
SUT SILK 2 0 SH CR/8 (SUTURE) ×5 IMPLANT
SUT SILK 2 0 TIES 10X30 (SUTURE) ×5 IMPLANT
SUT SILK 2-0 18XBRD TIE 12 (SUTURE) ×1 IMPLANT
SUT SILK 3 0 (SUTURE) ×5
SUT SILK 3 0 SH CR/8 (SUTURE) ×5 IMPLANT
SUT SILK 3 0 TIES 10X30 (SUTURE) ×5 IMPLANT
SUT SILK 3-0 18XBRD TIE 12 (SUTURE) ×1 IMPLANT
SUT SILK 4 0 (SUTURE) ×5
SUT SILK 4-0 18XBRD TIE 12 (SUTURE) ×1 IMPLANT
SUT VIC AB 2-0 CT1 27 (SUTURE) ×10
SUT VIC AB 2-0 CT1 TAPERPNT 27 (SUTURE) ×2 IMPLANT
SUT VIC AB 3-0 SH 18 (SUTURE) IMPLANT
SUT VIC AB 3-0 SH 27 (SUTURE) ×15
SUT VIC AB 3-0 SH 27X BRD (SUTURE) ×3 IMPLANT
SUT VICRYL 0 UR6 27IN ABS (SUTURE) ×9 IMPLANT
SYR 20CC LL (SYRINGE) ×6 IMPLANT
SYR 3ML LL SCALE MARK (SYRINGE) ×3 IMPLANT
SYRINGE 20CC LL (MISCELLANEOUS) IMPLANT
TAPE CLOTH SURG 4X10 WHT LF (GAUZE/BANDAGES/DRESSINGS) ×3 IMPLANT
TOWEL OR 17X26 10 PK STRL BLUE (TOWEL DISPOSABLE) ×5 IMPLANT
TRAY FOLEY CATH 16FRSI W/METER (SET/KITS/TRAYS/PACK) IMPLANT
TUBE CONNECTING 12'X1/4 (SUCTIONS)
TUBE CONNECTING 12X1/4 (SUCTIONS) IMPLANT
WATER STERILE IRR 1000ML POUR (IV SOLUTION) ×6 IMPLANT
YANKAUER SUCT BULB TIP NO VENT (SUCTIONS) IMPLANT

## 2014-11-25 NOTE — ED Notes (Signed)
1st unit PRBC infusing at right ac IV .
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
## 2014-11-25 NOTE — Progress Notes (Signed)
   11/25/14 1400  Clinical Encounter Type  Visited With Family  Visit Type Spiritual support  Referral From Chaplain  Recommendations Followup with family 9/6  Spiritual Encounters  Spiritual Needs Emotional  Stress Factors  Patient Stress Factors None identified  Family Stress Factors Not reviewed  Visited primarily with father regarding son's circumstances, provided spiritual and emotional support. Promised follow-up and prayer.
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
## 2014-11-25 NOTE — ED Notes (Signed)
Pt transported to OR

## 2014-11-25 NOTE — Progress Notes (Signed)
Patient ID: Caleb [name], male   DOB: Jan 17, 1991, 24 y.o.   MRN: [id number] Follow up - Trauma Critical Care  Patient Details:    Caleb [name] is an 24 y.o. male.  Lines/tubes : Airway 7.5 mm (Active)  Secured at (cm) 25 cm 11/25/2014 11:02 AM  Measured From Lips 11/25/2014 11:02 AM  Secured Location Left 11/25/2014 11:02 AM  Secured By [employer] 11/25/2014 11:02 AM  Tube Holder Repositioned Yes 11/25/2014 11:02 AM  Site Condition Dry 11/25/2014 11:02 AM     CVC Triple Lumen 11/25/14 Left Subclavian (Active)  Site Assessment Clean;Dry;Intact 11/25/2014  3:26 AM  Medial Flushed 11/25/2014  3:26 AM  Distal Lumen Status Flushed 11/25/2014  3:26 AM  Dressing Type Occlusive 11/25/2014  3:26 AM     Arterial Line 11/25/14 Left Radial (Active)     Closed System Drain 1 Left Groin Bulb (JP) 15 Fr. (Active)  Output (mL) 60 mL 11/25/2014 11:30 AM     Closed System Drain (Active)     Urethral Catheter C.Simmons,RN Latex;Straight-tip 16 Fr. (Active)  Output (mL) 240 mL 11/25/2014 10:28 AM    Microbiology/Sepsis markers: No results found for this or any previous visit.  Anti-infectives:  Anti-infectives    None      Best Practice/Protocols:   Continous Sedation  Consults:      Studies:    Events:  Subjective:    Overnight Issues:   Objective:  Vital signs for last 24 hours: Temp:  [97.9 F (36.6 C)] 97.9 F (36.6 C) (09/02 1115) Pulse Rate:  [67-127] 71 (09/02 1115) Resp:  [15-28] 18 (09/02 1115) BP: (57-166)/(0-103) 137/94 mmHg (09/02 1115) SpO2:  [87 %-100 %] 100 % (09/02 1115) Arterial Line BP: (142-160)/(87-106) 142/87 mmHg (09/02 1115) FiO2 (%):  [30 %-60 %] 30 % (09/02 1102) Weight:  [113.399 kg (250 lb)] 113.399 kg (250 lb) (09/02 0325)  Hemodynamic parameters for last 24 hours:    Intake/Output from previous day: 09/01 0701 - 09/02 0700 In: 16618 [I.V.:6250; Blood:10118; IV Piggyback:250] Out: 7400 [Urine:400; Blood:7000]  Intake/Output this  shift: Total I/O In: 3677.9 [I.V.:2557.9; Blood:1120] Out: 9735 [Urine:640; Drains:215; Blood:500]  Vent settings for last 24 hours: Vent Mode:  [-] PRVC FiO2 (%):  [30 %-60 %] 30 % Set Rate:  [16 bmp-20 bmp] 18 bmp Vt Set:  [500 mL-650 mL] 550 mL PEEP:  [5 cmH20] 5 cmH20 Plateau Pressure:  [17 cmH20-18 cmH20] 17 cmH20  Physical Exam:  General: on vent Neuro: sedated Resp: clear to auscultation bilaterally CVS: RRR GI: soft Extremities: significant edema LLE  Results for orders placed or performed during the hospital encounter of 11/25/14 (from the past 24 hour(s))  Prepare fresh frozen plasma     Status: None (Preliminary result)   Collection Time: 11/25/14  2:56 AM  Result Value Ref Range   Unit Number [id number]    Blood Component Type LIQ PLASMA    Unit division 00    Status of Unit ISSUED    Unit tag comment VERBAL ORDERS PER DR CAMPOS    Transfusion Status OK TO TRANSFUSE    Unit Number [id number]    Blood Component Type LIQ PLASMA    Unit division 00    Status of Unit ISSUED    Unit tag comment VERBAL ORDERS PER DR CAMPOS    Transfusion Status OK TO TRANSFUSE    Unit Number [id number]    Blood Component Type THAWED PLASMA    Unit division 00  Status of Unit ISSUED    Unit tag comment VERBAL ORDERS PER DR Redmond Pulling    Transfusion Status OK TO TRANSFUSE    Unit Number [id number]    Blood Component Type THAWED PLASMA    Unit division 00    Status of Unit ISSUED    Unit tag comment VERBAL ORDERS PER DR WILSON    Transfusion Status OK TO TRANSFUSE    Unit Number [id number]    Blood Component Type THWPLS APHR1    Unit division 00    Status of Unit ISSUED    Transfusion Status OK TO TRANSFUSE    Unit Number [id number]    Blood Component Type THWPLS APHR1    Unit division 00    Status of Unit ISSUED    Transfusion Status OK TO TRANSFUSE    Unit Number [id number]    Blood Component Type THAWED PLASMA    Unit division 00    Status of Unit  ISSUED    Transfusion Status OK TO TRANSFUSE    Unit Number [id number]    Blood Component Type THWPLS APHR2    Unit division 00    Status of Unit ISSUED    Transfusion Status OK TO TRANSFUSE    Unit Number [id number]    Blood Component Type THAWED PLASMA    Unit division 00    Status of Unit ISSUED    Transfusion Status OK TO TRANSFUSE    Unit Number [id number]    Blood Component Type LIQ PLASMA    Unit division 00    Status of Unit ISSUED    Transfusion Status OK TO TRANSFUSE    Unit Number [id number]    Blood Component Type LIQ PLASMA    Unit division 00    Status of Unit ISSUED    Transfusion Status OK TO TRANSFUSE    Unit Number [id number]    Blood Component Type THWPLS APHR2    Unit division 00    Status of Unit ISSUED    Transfusion Status OK TO TRANSFUSE    Unit Number [id number]    Blood Component Type THAWED PLASMA    Unit division 00    Status of Unit ISSUED    Transfusion Status OK TO TRANSFUSE    Unit Number [id number]    Blood Component Type THAWED PLASMA    Unit division 00    Status of Unit ISSUED    Transfusion Status OK TO TRANSFUSE    Unit Number [id number]    Blood Component Type THAWED PLASMA    Unit division 00    Status of Unit ISSUED    Transfusion Status OK TO TRANSFUSE    Unit Number [id number]    Blood Component Type THAWED PLASMA    Unit division 00    Status of Unit ISSUED    Transfusion Status OK TO TRANSFUSE    Unit Number [id number]    Blood Component Type THAWED PLASMA    Unit division 00    Status of Unit ISSUED    Transfusion Status OK TO TRANSFUSE    Unit Number [id number]    Blood Component Type THAWED PLASMA    Unit division 00    Status of Unit ISSUED    Transfusion Status OK TO TRANSFUSE    Unit Number [id number]    Blood Component Type THW PLS APHR    Unit division 00    Status of Unit ISSUED    Transfusion Status OK TO TRANSFUSE    Unit  Number [id number]    Blood Component  Type THAWED PLASMA    Unit division 00    Status of Unit ISSUED    Transfusion Status OK TO TRANSFUSE   Type and screen     Status: None (Preliminary result)   Collection Time: 11/25/14  3:11 AM  Result Value Ref Range   ABO/RH(D) A POS    Antibody Screen NEG    Sample Expiration 11/28/2014    Unit Number [id number]    Blood Component Type RED CELLS,LR    Unit division 00    Status of Unit ISSUED    Unit tag comment VERBAL ORDERS PER DR CAMPOS    Transfusion Status OK TO TRANSFUSE    Crossmatch Result COMPATIBLE    Unit Number [id number]    Blood Component Type RED CELLS,LR    Unit division 00    Status of Unit ISSUED    Unit tag comment VERBAL ORDERS PER DR CAMPOS    Transfusion Status OK TO TRANSFUSE    Crossmatch Result COMPATIBLE    Unit Number [id number]    Blood Component Type RED CELLS,LR    Unit division 00    Status of Unit ISSUED    Unit tag comment VERBAL ORDERS PER DR CAMPOS    Transfusion Status OK TO TRANSFUSE    Crossmatch Result COMPATIBLE    Unit Number [id number]    Blood Component Type RED CELLS,LR    Unit division 00    Status of Unit ISSUED    Unit tag comment VERBAL ORDERS PER DR CAMPOS    Transfusion Status OK TO TRANSFUSE    Crossmatch Result COMPATIBLE    Unit Number [id number]    Blood Component Type RED CELLS,LR    Unit division 00    Status of Unit ISSUED    Unit tag comment VERBAL ORDERS PER DR Redmond Pulling    Transfusion Status OK TO TRANSFUSE    Crossmatch Result COMPATIBLE    Unit Number [id number]    Blood Component Type RED CELLS,LR    Unit division 00    Status of Unit ISSUED    Unit tag comment VERBAL ORDERS PER DR Redmond Pulling    Transfusion Status OK TO TRANSFUSE    Crossmatch Result COMPATIBLE    Unit Number [id number]    Blood Component Type RED CELLS,LR    Unit division 00    Status of Unit ISSUED    Unit tag comment VERBAL ORDERS PER DR Redmond Pulling    Transfusion Status OK TO TRANSFUSE    Crossmatch Result  COMPATIBLE    Unit Number [id number]    Blood Component Type RED CELLS,LR    Unit division 00    Status of Unit ISSUED    Unit tag comment VERBAL ORDERS PER DR Redmond Pulling    Transfusion Status OK TO TRANSFUSE    Crossmatch Result COMPATIBLE    Unit Number [id number]    Blood Component Type RED CELLS,LR    Unit division 00    Status of Unit ISSUED    Unit tag comment VERBAL ORDERS PER DR Redmond Pulling    Transfusion Status OK TO TRANSFUSE    Crossmatch Result COMPATIBLE    Unit Number [id number]    Blood Component Type RED CELLS,LR    Unit division 00    Status of Unit ISSUED    Unit tag comment VERBAL ORDERS PER DR Redmond Pulling    Transfusion Status OK TO TRANSFUSE    Crossmatch Result COMPATIBLE    Unit  Number [id number]    Blood Component Type RED CELLS,LR    Unit division 00    Status of Unit ISSUED    Unit tag comment VERBAL ORDERS PER DR Redmond Pulling    Transfusion Status OK TO TRANSFUSE    Crossmatch Result COMPATIBLE    Unit Number [id number]    Blood Component Type RED CELLS,LR    Unit division 00    Status of Unit ISSUED    Unit tag comment VERBAL ORDERS PER DR Redmond Pulling    Transfusion Status OK TO TRANSFUSE    Crossmatch Result COMPATIBLE    Unit Number [id number]    Blood Component Type RED CELLS,LR    Unit division 00    Status of Unit ISSUED    Unit tag comment VERBAL ORDERS PER DR Redmond Pulling    Transfusion Status OK TO TRANSFUSE    Crossmatch Result COMPATIBLE    Unit Number [id number]    Blood Component Type RED CELLS,LR    Unit division 00    Status of Unit ISSUED    Unit tag comment VERBAL ORDERS PER DR Redmond Pulling    Transfusion Status OK TO TRANSFUSE    Crossmatch Result COMPATIBLE    Unit Number [id number]    Blood Component Type RED CELLS,LR    Unit division 00    Status of Unit REL FROM [facility]    Unit tag comment VERBAL ORDERS PER DR Redmond Pulling    Transfusion Status OK TO TRANSFUSE    Crossmatch Result NOT NEEDED    Unit Number [id number]    Blood  Component Type RED CELLS,LR    Unit division 00    Status of Unit REL FROM [facility]    Unit tag comment VERBAL ORDERS PER DR Redmond Pulling    Transfusion Status OK TO TRANSFUSE    Crossmatch Result NOT NEEDED    Unit Number [id number]    Blood Component Type RED CELLS,LR    Unit division 00    Status of Unit REL FROM [facility]    Unit tag comment VERBAL ORDERS PER DR Redmond Pulling    Transfusion Status OK TO TRANSFUSE    Crossmatch Result NOT NEEDED    Unit Number [id number]    Blood Component Type RED CELLS,LR    Unit division 00    Status of Unit REL FROM [facility]    Unit tag comment VERBAL ORDERS PER DR Redmond Pulling    Transfusion Status OK TO TRANSFUSE    Crossmatch Result NOT NEEDED    Unit Number [id number]    Blood Component Type RED CELLS,LR    Unit division 00    Status of Unit ISSUED    Transfusion Status OK TO TRANSFUSE    Crossmatch Result Compatible    Unit Number [id number]    Blood Component Type RED CELLS,LR    Unit division 00    Status of Unit ISSUED    Transfusion Status OK TO TRANSFUSE    Crossmatch Result Compatible    Unit Number [id number]    Blood Component Type RED CELLS,LR    Unit division 00    Status of Unit ISSUED    Transfusion Status OK TO TRANSFUSE    Crossmatch Result Compatible    Unit Number [id number]    Blood Component Type RED CELLS,LR    Unit division 00    Status of Unit ISSUED    Transfusion Status OK TO TRANSFUSE    Crossmatch Result Compatible    Unit Number [id number]    Blood Component  Type RED CELLS,LR    Unit division 00    Status of Unit ISSUED    Transfusion Status OK TO TRANSFUSE    Crossmatch Result Compatible    Unit Number [id number]    Blood Component Type RED CELLS,LR    Unit division 00    Status of Unit ISSUED    Transfusion Status OK TO TRANSFUSE    Crossmatch Result Compatible    Unit Number [id number]    Blood Component Type RBC LR PHER2    Unit division 00    Status of Unit ISSUED    Transfusion  Status OK TO TRANSFUSE    Crossmatch Result Compatible    Unit Number [id number]    Blood Component Type RED CELLS,LR    Unit division 00    Status of Unit ISSUED    Transfusion Status OK TO TRANSFUSE    Crossmatch Result Compatible    Unit Number [id number]    Blood Component Type RED CELLS,LR    Unit division 00    Status of Unit ALLOCATED    Transfusion Status OK TO TRANSFUSE    Crossmatch Result Compatible    Unit Number [id number]    Blood Component Type RED CELLS,LR    Unit division 00    Status of Unit ALLOCATED    Transfusion Status OK TO TRANSFUSE    Crossmatch Result Compatible    Unit Number [id number]    Blood Component Type RED CELLS,LR    Unit division 00    Status of Unit ALLOCATED    Transfusion Status OK TO TRANSFUSE    Crossmatch Result Compatible    Unit Number [id number]    Blood Component Type RED CELLS,LR    Unit division 00    Status of Unit ALLOCATED    Transfusion Status OK TO TRANSFUSE    Crossmatch Result Compatible   CDS serology     Status: None   Collection Time: 11/25/14  3:11 AM  Result Value Ref Range   CDS serology specimen      SPECIMEN WILL BE HELD FOR 14 DAYS IF TESTING IS REQUIRED  Comprehensive metabolic panel     Status: Abnormal   Collection Time: 11/25/14  3:11 AM  Result Value Ref Range   Sodium 150 (H) 135 - 145 mmol/L   Potassium 4.3 3.5 - 5.1 mmol/L   Chloride 108 101 - 111 mmol/L   CO2 8 (L) 22 - 32 mmol/L   Glucose, Bld 251 (H) 65 - 99 mg/dL   BUN 10 6 - 20 mg/dL   Creatinine, Ser 1.84 (H) 0.61 - 1.24 mg/dL   Calcium 10.0 8.9 - 10.3 mg/dL   Total Protein 5.8 (L) 6.5 - 8.1 g/dL   Albumin 3.5 3.5 - 5.0 g/dL   AST 56 (H) 15 - 41 U/L   ALT 31 17 - 63 U/L   Alkaline Phosphatase 45 38 - 126 U/L   Total Bilirubin 0.4 0.3 - 1.2 mg/dL   GFR calc non Af Amer 50 (L) >60 mL/min   GFR calc Af Amer 58 (L) >60 mL/min   Anion gap 34 (H) 5 - 15  CBC     Status: Abnormal   Collection Time: 11/25/14  3:11 AM   Result Value Ref Range   WBC 5.3 4.0 - 10.5 K/uL   RBC 4.52 4.22 - 5.81 MIL/uL   Hemoglobin 11.7 (L) 13.0 - 17.0 g/dL   HCT 40.0 39.0 - 52.0 %   MCV 88.5 78.0 -  100.0 fL   MCH 25.9 (L) 26.0 - 34.0 pg   MCHC 29.3 (L) 30.0 - 36.0 g/dL   RDW 14.0 11.5 - 15.5 %   Platelets 147 (L) 150 - 400 K/uL  Ethanol     Status: Abnormal   Collection Time: 11/25/14  3:11 AM  Result Value Ref Range   Alcohol, Ethyl (B) 69 (H) <5 mg/dL  Protime-INR     Status: Abnormal   Collection Time: 11/25/14  3:11 AM  Result Value Ref Range   Prothrombin Time 22.8 (H) 11.6 - 15.2 seconds   INR 2.03 (H) 0.00 - 1.49  ABO/Rh     Status: None   Collection Time: 11/25/14  3:11 AM  Result Value Ref Range   ABO/RH(D) A POS   Initiate MTP (Blood Bank Notification)     Status: None   Collection Time: 11/25/14  3:11 AM  Result Value Ref Range   Initiate Massive Transfusion Protocol      VERBAL ORDER CONFIRMED MTP ACTIVATED ON 11/25/14 AT 0330 BY DR.WILSON  I-Stat Chem 8, ED  (not at [facility], [facility])     Status: Abnormal   Collection Time: 11/25/14  3:52 AM  Result Value Ref Range   Sodium 147 (H) 135 - 145 mmol/L   Potassium 4.1 3.5 - 5.1 mmol/L   Chloride 109 101 - 111 mmol/L   BUN 10 6 - 20 mg/dL   Creatinine, Ser 1.70 (H) 0.61 - 1.24 mg/dL   Glucose, Bld 238 (H) 65 - 99 mg/dL   Calcium, Ion 1.17 1.12 - 1.23 mmol/L   TCO2 9 0 - 100 mmol/L   Hemoglobin 13.9 13.0 - 17.0 g/dL   HCT 41.0 39.0 - 52.0 %   Comment NOTIFIED PHYSICIAN   Prepare platelet pheresis     Status: None (Preliminary result)   Collection Time: 11/25/14  3:59 AM  Result Value Ref Range   Unit Number [id number]    Blood Component Type PLTPHER LRI1    Unit division 00    Status of Unit ISSUED    Transfusion Status OK TO TRANSFUSE    Unit tag comment VERBAL ORDERS PER DR WILSON    Unit Number [id number]    Blood Component Type PLTPHER LR3    Unit division 00    Status of Unit ISSUED    Transfusion Status OK TO TRANSFUSE    Unit Number  [id number]    Blood Component Type PLTP LR2 PAS    Unit division 00    Status of Unit ISSUED    Transfusion Status OK TO TRANSFUSE   Type and screen     Status: None   Collection Time: 11/25/14  4:42 AM  Result Value Ref Range   ABO/RH(D) A POS    Antibody Screen NEG    Sample Expiration 11/28/2014   DIC panel     Status: Abnormal   Collection Time: 11/25/14  5:10 AM  Result Value Ref Range   Prothrombin Time 24.2 (H) 11.6 - 15.2 seconds   INR 2.20 (H) 0.00 - 1.49   aPTT 97 (H) 24 - 37 seconds   Fibrinogen 113 (L) 204 - 475 mg/dL   D-Dimer, Quant >20.00 (H) 0.00 - 0.48 ug/mL-FEU   Platelets 106 (L) 150 - 400 K/uL   Smear Review NO SCHISTOCYTES SEEN   I-STAT 7, (LYTES, BLD GAS, ICA, H+H)     Status: Abnormal   Collection Time: 11/25/14  5:13 AM  Result Value Ref Range   pH,  Arterial 7.077 (LL) 7.350 - 7.450   pCO2 arterial 39.0 35.0 - 45.0 mmHg   pO2, Arterial 420.0 (H) 80.0 - 100.0 mmHg   Bicarbonate 11.6 (L) 20.0 - 24.0 mEq/L   TCO2 13 0 - 100 mmol/L   O2 Saturation 100.0 %   Acid-base deficit 17.0 (H) 0.0 - 2.0 mmol/L   Sodium 140 135 - 145 mmol/L   Potassium 5.1 3.5 - 5.1 mmol/L   Calcium, Ion 0.72 (L) 1.12 - 1.23 mmol/L   HCT 23.0 (L) 39.0 - 52.0 %   Hemoglobin 7.8 (L) 13.0 - 17.0 g/dL   Patient temperature 36.1 C    Sample type ARTERIAL    Comment MD NOTIFIED, REPEAT TEST   Prepare cryoprecipitate     Status: None (Preliminary result)   Collection Time: 11/25/14  5:44 AM  Result Value Ref Range   Unit Number [id number]    Blood Component Type CRYPOOL THAW    Unit division 00    Status of Unit ISSUED    Transfusion Status OK TO TRANSFUSE   I-STAT 7, (LYTES, BLD GAS, ICA, H+H)     Status: Abnormal   Collection Time: 11/25/14  6:09 AM  Result Value Ref Range   pH, Arterial 7.246 (L) 7.350 - 7.450   pCO2 arterial 39.7 35.0 - 45.0 mmHg   pO2, Arterial 424.0 (H) 80.0 - 100.0 mmHg   Bicarbonate 17.4 (L) 20.0 - 24.0 mEq/L   TCO2 19 0 - 100 mmol/L   O2  Saturation 100.0 %   Acid-base deficit 9.0 (H) 0.0 - 2.0 mmol/L   Sodium 141 135 - 145 mmol/L   Potassium 5.4 (H) 3.5 - 5.1 mmol/L   Calcium, Ion 0.87 (L) 1.12 - 1.23 mmol/L   HCT 29.0 (L) 39.0 - 52.0 %   Hemoglobin 9.9 (L) 13.0 - 17.0 g/dL   Patient temperature 36.2 C    Sample type ARTERIAL   I-STAT 7, (LYTES, BLD GAS, ICA, H+H)     Status: Abnormal   Collection Time: 11/25/14  8:06 AM  Result Value Ref Range   pH, Arterial 7.412 7.350 - 7.450   pCO2 arterial 34.2 (L) 35.0 - 45.0 mmHg   pO2, Arterial 155.0 (H) 80.0 - 100.0 mmHg   Bicarbonate 21.9 20.0 - 24.0 mEq/L   TCO2 23 0 - 100 mmol/L   O2 Saturation 99.0 %   Acid-base deficit 2.0 0.0 - 2.0 mmol/L   Sodium 144 135 - 145 mmol/L   Potassium 3.5 3.5 - 5.1 mmol/L   Calcium, Ion 0.92 (L) 1.12 - 1.23 mmol/L   HCT 25.0 (L) 39.0 - 52.0 %   Hemoglobin 8.5 (L) 13.0 - 17.0 g/dL   Patient temperature 36.4 C    Sample type ARTERIAL   CBC     Status: Abnormal   Collection Time: 11/25/14 10:00 AM  Result Value Ref Range   WBC 12.6 (H) 4.0 - 10.5 K/uL   RBC 4.00 (L) 4.22 - 5.81 MIL/uL   Hemoglobin 11.9 (L) 13.0 - 17.0 g/dL   HCT 33.6 (L) 39.0 - 52.0 %   MCV 84.0 78.0 - 100.0 fL   MCH 29.8 26.0 - 34.0 pg   MCHC 35.4 30.0 - 36.0 g/dL   RDW 13.9 11.5 - 15.5 %   Platelets 73 (L) 150 - 400 K/uL  Basic metabolic panel     Status: Abnormal   Collection Time: 11/25/14 10:00 AM  Result Value Ref Range   Sodium 142 135 - 145 mmol/L  Potassium 3.6 3.5 - 5.1 mmol/L   Chloride 105 101 - 111 mmol/L   CO2 23 22 - 32 mmol/L   Glucose, Bld 85 65 - 99 mg/dL   BUN 11 6 - 20 mg/dL   Creatinine, Ser 2.34 (H) 0.61 - 1.24 mg/dL   Calcium 8.7 (L) 8.9 - 10.3 mg/dL   GFR calc non Af Amer 38 (L) >60 mL/min   GFR calc Af Amer 43 (L) >60 mL/min   Anion gap 14 5 - 15  Protime-INR     Status: Abnormal   Collection Time: 11/25/14 10:00 AM  Result Value Ref Range   Prothrombin Time 17.0 (H) 11.6 - 15.2 seconds   INR 1.38 0.00 - 1.49  Triglycerides      Status: None   Collection Time: 11/25/14 10:00 AM  Result Value Ref Range   Triglycerides 75 <150 mg/dL  I-STAT 3, arterial blood gas (G3+)     Status: Abnormal   Collection Time: 11/25/14 10:37 AM  Result Value Ref Range   pH, Arterial 7.572 (H) 7.350 - 7.450   pCO2 arterial 30.1 (L) 35.0 - 45.0 mmHg   pO2, Arterial 319.0 (H) 80.0 - 100.0 mmHg   Bicarbonate 27.9 (H) 20.0 - 24.0 mEq/L   TCO2 29 0 - 100 mmol/L   O2 Saturation 100.0 %   Acid-Base Excess 6.0 (H) 0.0 - 2.0 mmol/L   Patient temperature 97.0 F    Collection site ARTERIAL LINE    Drawn by Nurse    Sample type ARTERIAL     Assessment & Plan: Present on Admission:  . Hemorrhagic shock   LOS: 0 days   Additional comments:I reviewed the patient's new clinical lab test results. . Post-op Well resuscitated from hemorrhagic shock overventilated - vent changes and F/U ABG CXR P CBC this PM Concern with swelling LLE - vascular following closely to observe for need for fasciotomies I spoke with his family Critical Care Total Time*: [place]  Georganna Skeans, MD, MPH, FACS Trauma: [phone] General Surgery: [phone]  11/25/2014  *Care during the described time interval was provided by me. I have reviewed this patient's available data, including medical history, events of note, physical examination and test results as part of my evaluation.

## 2014-11-25 NOTE — Anesthesia Postprocedure Evaluation (Signed)
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

## 2014-11-25 NOTE — Progress Notes (Signed)
   11/25/14 0400  Clinical Encounter Type  Visited With Family;Health care provider (law enforcement)  Visit Type Initial;Trauma;ED;Social support;Spiritual support  Referral From Nurse  Spiritual Encounters  Spiritual Needs Prayer;Emotional;Grief support  Stress Factors  Family Stress Factors Exhausted  [place] liaison with law enforcement to talk with family; [place] escorted family to consult room for MD to discuss pt condition; pt in surgery and [place] escorted family to surgical waiting area; Spiritual support, prayer and emotional support offered. CH follow-up recommended. Gwynn Burly 4:56 AM

## 2014-11-25 NOTE — Transfer of Care (Signed)
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 with Propaten Gore-tex graft (Left) Left Femoral Vein to Common Femoral Vein Bypass with Contralateral Greater Saphenous Vein (Left) CHIN LACERATION REPAIR (N/A)  Patient Location: SICU  Anesthesia Type:General  Level of Consciousness: sedated, unresponsive and Patient remains intubated per anesthesia plan  Airway & Oxygen Therapy: Patient remains intubated per anesthesia plan, Patient placed on Ventilator (see vital sign flow sheet for setting) and report given to Elmyra Ricks, RN  Post-op Assessment: Report given to RN and Post -op Vital signs reviewed and stable  Post vital signs: Reviewed  Last Vitals:  Filed Vitals:   11/25/14 0327  BP: 63/35  Pulse: 86  Resp: 25    Complications: No apparent anesthesia complications

## 2014-11-25 NOTE — Anesthesia Preprocedure Evaluation (Addendum)
Anesthesia Evaluation  Patient identified by MRN, date of birth, ID band Patient unresponsive  Preop documentation limited or incomplete due to emergent nature of procedure.  Airway       Comment: Pt already intubated. Dental   Pulmonary          Cardiovascular Rate:Tachycardia     Neuro/Psych    GI/Hepatic   Endo/Other    Renal/GU      Musculoskeletal   Abdominal   Peds  Hematology   Anesthesia Other Findings   Reproductive/Obstetrics                            Anesthesia Physical Anesthesia Plan  ASA: IV and emergent  Anesthesia Plan: General   Post-op Pain Management:    Induction: Intravenous  Airway Management Planned: Oral ETT  Additional Equipment: Arterial line and CVP  Intra-op Plan:   Post-operative Plan: Post-operative intubation/ventilation  Informed Consent:   Plan Discussed with: CRNA, Anesthesiologist and Surgeon  Anesthesia Plan Comments: (Emergency case.  Pt was rushed to OR.)       Anesthesia Quick Evaluation
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
## 2014-11-25 NOTE — ED Notes (Signed)
MTP paged by Network engineer.
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
## 2014-11-25 NOTE — ED Notes (Signed)
2nd unit PRBC infusing at left central line.
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
## 2014-11-26 ENCOUNTER — Inpatient Hospital Stay (HOSPITAL_COMMUNITY): Payer: 59

## 2014-11-26 ENCOUNTER — Encounter (HOSPITAL_COMMUNITY): Payer: Self-pay | Admitting: *Deleted

## 2014-11-26 LAB — PREPARE FRESH FROZEN PLASMA
UNIT DIVISION: 0
UNIT DIVISION: 0
UNIT DIVISION: 0
UNIT DIVISION: 0
UNIT DIVISION: 0
UNIT DIVISION: 0
UNIT DIVISION: 0
UNIT DIVISION: 0
UNIT DIVISION: 0
UNIT DIVISION: 0
UNIT DIVISION: 0
Unit division: 0
Unit division: 0
Unit division: 0
Unit division: 0
Unit division: 0
Unit division: 0
Unit division: 0
Unit division: 0
Unit division: 0

## 2014-11-26 LAB — BASIC METABOLIC PANEL
Anion gap: 9 (ref 5–15)
BUN: 18 mg/dL (ref 6–20)
CALCIUM: 7.6 mg/dL — AB (ref 8.9–10.3)
CO2: 26 mmol/L (ref 22–32)
Chloride: 106 mmol/L (ref 101–111)
Creatinine, Ser: 3.65 mg/dL — ABNORMAL HIGH (ref 0.61–1.24)
GFR calc Af Amer: 25 mL/min — ABNORMAL LOW (ref >60)
GFR, EST NON AFRICAN AMERICAN: 22 mL/min — AB (ref >60)
GLUCOSE: 101 mg/dL — AB (ref 65–99)
Potassium: 3.2 mmol/L — ABNORMAL LOW (ref 3.5–5.1)
SODIUM: 141 mmol/L (ref 135–145)

## 2014-11-26 LAB — CBC
HCT: 36.5 % — ABNORMAL LOW (ref 39.0–52.0)
Hemoglobin: 13.2 g/dL (ref 13.0–17.0)
MCH: 30.6 pg (ref 26.0–34.0)
MCHC: 36.2 g/dL — ABNORMAL HIGH (ref 30.0–36.0)
MCV: 84.5 fL (ref 78.0–100.0)
PLATELETS: 60 10*3/uL — AB (ref 150–400)
RBC: 4.32 MIL/uL (ref 4.22–5.81)
RDW: 14.5 % (ref 11.5–15.5)
WBC: 11 10*3/uL — AB (ref 4.0–10.5)

## 2014-11-26 LAB — PREPARE CRYOPRECIPITATE: Unit division: 0

## 2014-11-26 LAB — DIC (DISSEMINATED INTRAVASCULAR COAGULATION)PANEL
Platelets: 60 10*3/uL — ABNORMAL LOW (ref 150–400)
Smear Review: NONE SEEN

## 2014-11-26 LAB — CK TOTAL AND CKMB (NOT AT ARMC): CK, MB: >260 ng/mL — ABNORMAL HIGH (ref 0.5–5.0)

## 2014-11-26 LAB — MAGNESIUM: Magnesium: 2.1 mg/dL (ref 1.7–2.4)

## 2014-11-26 LAB — PREPARE PLATELET PHERESIS
Unit division: 0
Unit division: 0
Unit division: 0

## 2014-11-26 LAB — PROTIME-INR
INR: 1.53 — ABNORMAL HIGH (ref 0.00–1.49)
PROTHROMBIN TIME: 18.4 s — AB (ref 11.6–15.2)

## 2014-11-26 LAB — DIC (DISSEMINATED INTRAVASCULAR COAGULATION) PANEL
APTT: 32 s (ref 24–37)
FIBRINOGEN: 252 mg/dL (ref 204–475)
INR: 1.49 (ref 0.00–1.49)
PROTHROMBIN TIME: 18.1 s — AB (ref 11.6–15.2)

## 2014-11-26 LAB — BLOOD GAS, ARTERIAL
ACID-BASE EXCESS: 2.3 mmol/L — AB (ref 0.0–2.0)
Bicarbonate: 24.9 mEq/L — ABNORMAL HIGH (ref 20.0–24.0)
DRAWN BY: 23604
FIO2: 0.3
LHR: 18 {breaths}/min
MECHVT: 550 mL
O2 SAT: 99.1 %
PATIENT TEMPERATURE: 98.6
PCO2 ART: 29.5 mmHg — AB (ref 35.0–45.0)
PEEP/CPAP: 5 cmH2O
PH ART: 7.537 — AB (ref 7.350–7.450)
PO2 ART: 140 mmHg — AB (ref 80.0–100.0)
TCO2: 25.9 mmol/L (ref 0–100)

## 2014-11-26 LAB — CK

## 2014-11-26 LAB — PHOSPHORUS: Phosphorus: 4.2 mg/dL (ref 2.5–4.6)

## 2014-11-26 MED ORDER — DEXTROSE-NACL 5-0.9 % IV SOLN
INTRAVENOUS | Status: DC
  Administered 2014-11-26: 10:00:00 via INTRAVENOUS

## 2014-11-26 MED ORDER — KCL IN DEXTROSE-NACL 40-5-0.9 MEQ/L-%-% IV SOLN
INTRAVENOUS | Status: DC
  Filled 2014-11-26 (×2): qty 1000

## 2014-11-26 MED ORDER — POTASSIUM CHLORIDE 10 MEQ/100ML IV SOLN
10.0000 meq | INTRAVENOUS | Status: AC
  Administered 2014-11-26 (×3): 10 meq via INTRAVENOUS
  Filled 2014-11-26 (×3): qty 100

## 2014-11-26 MED ORDER — SODIUM BICARBONATE 8.4 % IV SOLN
INTRAVENOUS | Status: DC
  Administered 2014-11-26 – 2014-11-30 (×7): via INTRAVENOUS
  Filled 2014-11-26 (×14): qty 150

## 2014-11-26 NOTE — Progress Notes (Signed)
Vent settings changed per CCM order.  Decreased RR to 12, VT to be at 48ml, inc to 600.  Pt tolerating well.
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
## 2014-11-26 NOTE — Progress Notes (Signed)
   VASCULAR SURGERY ASSESSMENT & PLAN:  * 1 Day Post-Op s/p:   1. Repair of left femoral vein with interposition R GSV  2. Left SFA to SFA bypass with 6 mm PTFE  * Elevate leg above heart  * Thrombocytopenia: will need to hold heparin.   * ARF: CRT = 3.65   SUBJECTIVE: Sedated on vent  PHYSICAL EXAM: Filed Vitals:   11/26/14 0500 11/26/14 0600 11/26/14 0700 11/26/14 0722  BP: 115/65 111/78 117/82 152/84  Pulse: 82 101 105 100  Temp:      TempSrc:      Resp: 18 18 18 18   Height:      Weight:      SpO2: 100% 100% 100% 100%   Good PT signal with doppler Moderate left leg swelling.   LABS: Lab Results  Component Value Date   WBC 11.0* 11/26/2014   HGB 13.2 11/26/2014   HCT 36.5* 11/26/2014   MCV 84.5 11/26/2014   PLT 60* 11/26/2014   Lab Results  Component Value Date   CREATININE 3.65* 11/26/2014   Lab Results  Component Value Date   INR 1.53* 11/26/2014   Active Problems:   Hemorrhagic shock   Acute respiratory failure with hypoxia   Acute post-hemorrhagic anemia   Gae Gallop Beeper: [phone] 11/26/2014

## 2014-11-27 ENCOUNTER — Inpatient Hospital Stay (HOSPITAL_COMMUNITY): Payer: 59

## 2014-11-27 LAB — CBC
HEMATOCRIT: 36.5 % — AB (ref 39.0–52.0)
Hemoglobin: 12.6 g/dL — ABNORMAL LOW (ref 13.0–17.0)
MCH: 30.4 pg (ref 26.0–34.0)
MCHC: 34.5 g/dL (ref 30.0–36.0)
MCV: 88 fL (ref 78.0–100.0)
Platelets: 59 10*3/uL — ABNORMAL LOW (ref 150–400)
RBC: 4.15 MIL/uL — ABNORMAL LOW (ref 4.22–5.81)
RDW: 14.7 % (ref 11.5–15.5)
WBC: 11.1 10*3/uL — ABNORMAL HIGH (ref 4.0–10.5)

## 2014-11-27 LAB — POCT I-STAT 3, ART BLOOD GAS (G3+)
Acid-Base Excess: 6 mmol/L — ABNORMAL HIGH (ref 0.0–2.0)
BICARBONATE: 30.1 meq/L — AB (ref 20.0–24.0)
O2 SAT: 99 %
PCO2 ART: 41.8 mmHg (ref 35.0–45.0)
PO2 ART: 156 mmHg — AB (ref 80.0–100.0)
Patient temperature: 99.7
TCO2: 31 mmol/L (ref 0–100)
pH, Arterial: 7.468 — ABNORMAL HIGH (ref 7.350–7.450)

## 2014-11-27 LAB — BASIC METABOLIC PANEL
Anion gap: 9 (ref 5–15)
BUN: 22 mg/dL — AB (ref 6–20)
CO2: 27 mmol/L (ref 22–32)
Calcium: 7.3 mg/dL — ABNORMAL LOW (ref 8.9–10.3)
Chloride: 103 mmol/L (ref 101–111)
Creatinine, Ser: 5.16 mg/dL — ABNORMAL HIGH (ref 0.61–1.24)
GFR calc Af Amer: 17 mL/min — ABNORMAL LOW (ref >60)
GFR, EST NON AFRICAN AMERICAN: 14 mL/min — AB (ref >60)
GLUCOSE: 98 mg/dL (ref 65–99)
POTASSIUM: 3.8 mmol/L (ref 3.5–5.1)
Sodium: 139 mmol/L (ref 135–145)

## 2014-11-27 LAB — CALCIUM, IONIZED: CALCIUM, IONIZED, SERUM: 4.6 mg/dL (ref 4.5–5.6)

## 2014-11-27 LAB — PHOSPHORUS: PHOSPHORUS: 5.7 mg/dL — AB (ref 2.5–4.6)

## 2014-11-27 LAB — MAGNESIUM: Magnesium: 2 mg/dL (ref 1.7–2.4)

## 2014-11-27 MED ORDER — PIVOT 1.5 CAL PO LIQD
1000.0000 mL | ORAL | Status: DC
  Administered 2014-11-27: 1000 mL
  Filled 2014-11-27 (×3): qty 1000

## 2014-11-27 NOTE — Progress Notes (Signed)
   VASCULAR SURGERY ASSESSMENT & PLAN:  * 2 Day Post-Op s/p:  1. Repair of left femoral vein with interposition R GSV 2. Left SFA to SFA bypass with 6 mm PTFE  * Elevate leg above heart. SCD in place  * Thrombocytopenia:  Stable (PLT = 59 K). No heparin.  * ARF: crt worse.  * JP with 115 cc lymphatic drainage for 12 hours. JP needs to stay for now.   SUBJECTIVE: Sedated on vent.  PHYSICAL EXAM: Filed Vitals:   11/27/14 0400 11/27/14 0500 11/27/14 0600 11/27/14 0700  BP: 117/62 109/58 108/61 114/64  Pulse: 101 88 88 82  Temp: 98.9 F (37.2 C)     TempSrc: Oral     Resp: 12 12 12 12   Height:      Weight:      SpO2: 100% 100% 100% 100%   Mild left leg swelling Right GSV harvest site ok Good doppler flow left foot  LABS: Lab Results  Component Value Date   WBC 11.1* 11/27/2014   HGB 12.6* 11/27/2014   HCT 36.5* 11/27/2014   MCV 88.0 11/27/2014   PLT 59* 11/27/2014   Lab Results  Component Value Date   CREATININE 5.16* 11/27/2014   Lab Results  Component Value Date   INR 1.49 11/26/2014   Active Problems:   Hemorrhagic shock   Acute respiratory failure with hypoxia   Acute post-hemorrhagic anemia   Gae Gallop Beeper: [phone] 11/27/2014
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
## 2014-11-28 ENCOUNTER — Encounter (HOSPITAL_COMMUNITY): Payer: 59

## 2014-11-28 ENCOUNTER — Encounter (HOSPITAL_COMMUNITY): Admission: EM | Disposition: A | Discharge status: Rehabilitation Facility | Payer: Self-pay | Source: Home / Self Care

## 2014-11-28 ENCOUNTER — Inpatient Hospital Stay (HOSPITAL_COMMUNITY): Payer: 59 | Admitting: Anesthesiology

## 2014-11-28 ENCOUNTER — Inpatient Hospital Stay (INDEPENDENT_AMBULATORY_CARE_PROVIDER_SITE_OTHER): Payer: 59

## 2014-11-28 ENCOUNTER — Inpatient Hospital Stay (HOSPITAL_COMMUNITY): Payer: 59

## 2014-11-28 DIAGNOSIS — R609 Edema, unspecified: Secondary | ICD-10-CM

## 2014-11-28 DIAGNOSIS — N179 Acute kidney failure, unspecified: Secondary | ICD-10-CM

## 2014-11-28 DIAGNOSIS — T79A0XA Compartment syndrome, unspecified, initial encounter: Secondary | ICD-10-CM

## 2014-11-28 HISTORY — PX: FASCIOTOMY: SHX132

## 2014-11-28 HISTORY — PX: APPLICATION OF WOUND VAC: SHX5189

## 2014-11-28 LAB — BASIC METABOLIC PANEL
Anion gap: 10 (ref 5–15)
BUN: 30 mg/dL — AB (ref 6–20)
CALCIUM: 7.2 mg/dL — AB (ref 8.9–10.3)
CHLORIDE: 101 mmol/L (ref 101–111)
CO2: 29 mmol/L (ref 22–32)
CREATININE: 8.07 mg/dL — AB (ref 0.61–1.24)
GFR calc Af Amer: 10 mL/min — ABNORMAL LOW (ref >60)
GFR calc non Af Amer: 8 mL/min — ABNORMAL LOW (ref >60)
GLUCOSE: 109 mg/dL — AB (ref 65–99)
Potassium: 3.7 mmol/L (ref 3.5–5.1)
Sodium: 140 mmol/L (ref 135–145)

## 2014-11-28 LAB — CBC
HCT: 32 % — ABNORMAL LOW (ref 39.0–52.0)
Hemoglobin: 10.8 g/dL — ABNORMAL LOW (ref 13.0–17.0)
MCH: 29.8 pg (ref 26.0–34.0)
MCHC: 33.8 g/dL (ref 30.0–36.0)
MCV: 88.2 fL (ref 78.0–100.0)
PLATELETS: 64 10*3/uL — AB (ref 150–400)
RBC: 3.63 MIL/uL — ABNORMAL LOW (ref 4.22–5.81)
RDW: 14.1 % (ref 11.5–15.5)
WBC: 10.3 10*3/uL (ref 4.0–10.5)

## 2014-11-28 LAB — GLUCOSE, CAPILLARY: Glucose-Capillary: 129 mg/dL — ABNORMAL HIGH (ref 65–99)

## 2014-11-28 LAB — BLOOD GAS, ARTERIAL
ACID-BASE EXCESS: 4.8 mmol/L — AB (ref 0.0–2.0)
Bicarbonate: 29.1 mEq/L — ABNORMAL HIGH (ref 20.0–24.0)
Drawn by: 236041
FIO2: 0.3
O2 SAT: 99.4 %
PCO2 ART: 45.1 mmHg — AB (ref 35.0–45.0)
PEEP/CPAP: 5 cmH2O
PH ART: 7.426 (ref 7.350–7.450)
Patient temperature: 98.6
RATE: 12 resp/min
TCO2: 30.5 mmol/L (ref 0–100)
VT: 600 mL
pO2, Arterial: 251 mmHg — ABNORMAL HIGH (ref 80.0–100.0)

## 2014-11-28 LAB — TRIGLYCERIDES: Triglycerides: 762 mg/dL — ABNORMAL HIGH (ref <150)

## 2014-11-28 SURGERY — FASCIOTOMY, UPPER EXTREMITY
Anesthesia: General | Site: Leg Upper | Laterality: Left

## 2014-11-28 MED ORDER — FUROSEMIDE 10 MG/ML IJ SOLN
80.0000 mg | Freq: Four times a day (QID) | INTRAMUSCULAR | Status: DC
  Administered 2014-11-28 – 2014-11-29 (×5): 80 mg via INTRAVENOUS
  Filled 2014-11-28 (×9): qty 8

## 2014-11-28 MED ORDER — FENTANYL CITRATE (PF) 100 MCG/2ML IJ SOLN
INTRAMUSCULAR | Status: DC | PRN
  Administered 2014-11-28 (×2): 50 ug via INTRAVENOUS
  Administered 2014-11-28: 100 ug via INTRAVENOUS
  Administered 2014-11-28: 50 ug via INTRAVENOUS

## 2014-11-28 MED ORDER — FENTANYL CITRATE (PF) 250 MCG/5ML IJ SOLN
INTRAMUSCULAR | Status: AC
  Filled 2014-11-28: qty 5

## 2014-11-28 MED ORDER — SODIUM CHLORIDE 0.9 % IV SOLN
1.0000 mg/h | INTRAVENOUS | Status: DC
  Administered 2014-11-28: 2 mg/h via INTRAVENOUS
  Filled 2014-11-28: qty 10

## 2014-11-28 MED ORDER — PIVOT 1.5 CAL PO LIQD
1000.0000 mL | ORAL | Status: DC
  Filled 2014-11-28: qty 1000

## 2014-11-28 MED ORDER — SODIUM CHLORIDE 0.9 % IJ SOLN
INTRAMUSCULAR | Status: AC
  Filled 2014-11-28: qty 10

## 2014-11-28 MED ORDER — CEFAZOLIN SODIUM 1-5 GM-% IV SOLN
INTRAVENOUS | Status: DC | PRN
  Administered 2014-11-28: 1 g via INTRAVENOUS

## 2014-11-28 MED ORDER — MIDAZOLAM HCL 5 MG/5ML IJ SOLN
INTRAMUSCULAR | Status: DC | PRN
  Administered 2014-11-28: 2 mg via INTRAVENOUS

## 2014-11-28 MED ORDER — DEXAMETHASONE SODIUM PHOSPHATE 4 MG/ML IJ SOLN
INTRAMUSCULAR | Status: AC
Start: 2014-11-28 — End: 2014-11-28
  Filled 2014-11-28: qty 2

## 2014-11-28 MED ORDER — VECURONIUM BROMIDE 10 MG IV SOLR
INTRAVENOUS | Status: AC
  Filled 2014-11-28: qty 10

## 2014-11-28 MED ORDER — ROCURONIUM BROMIDE 100 MG/10ML IV SOLN
INTRAVENOUS | Status: DC | PRN
  Administered 2014-11-28: 50 mg via INTRAVENOUS

## 2014-11-28 MED ORDER — VECURONIUM BROMIDE 10 MG IV SOLR
INTRAVENOUS | Status: DC | PRN
  Administered 2014-11-28: 5 mg via INTRAVENOUS
  Administered 2014-11-28: 3 mg via INTRAVENOUS

## 2014-11-28 MED ORDER — VITAL HIGH PROTEIN PO LIQD
1000.0000 mL | ORAL | Status: DC
  Administered 2014-11-28 – 2014-11-29 (×2): 1000 mL
  Filled 2014-11-28 (×4): qty 1000

## 2014-11-28 MED ORDER — SODIUM CHLORIDE 0.9 % IV SOLN
1.0000 mg/h | INTRAVENOUS | Status: DC
  Administered 2014-11-29: 4 mg/h via INTRAVENOUS
  Administered 2014-11-29: 1 mg/h via INTRAVENOUS
  Administered 2014-11-30: 4 mg/h via INTRAVENOUS
  Administered 2014-11-30: 1 mg/h via INTRAVENOUS
  Administered 2014-12-02: 4 mg/h via INTRAVENOUS
  Administered 2014-12-03: 3 mg/h via INTRAVENOUS
  Administered 2014-12-03: 2 mg/h via INTRAVENOUS
  Administered 2014-12-04: 4 mg/h via INTRAVENOUS
  Administered 2014-12-05 – 2014-12-08 (×3): 1 mg/h via INTRAVENOUS
  Filled 2014-11-28 (×16): qty 10

## 2014-11-28 MED ORDER — PROPOFOL 10 MG/ML IV BOLUS
INTRAVENOUS | Status: AC
  Filled 2014-11-28: qty 20

## 2014-11-28 MED ORDER — PRO-STAT SUGAR FREE PO LIQD
60.0000 mL | Freq: Four times a day (QID) | ORAL | Status: DC
  Administered 2014-11-28 – 2014-11-29 (×4): 60 mL
  Filled 2014-11-28 (×7): qty 60

## 2014-11-28 MED ORDER — ROCURONIUM BROMIDE 50 MG/5ML IV SOLN
INTRAVENOUS | Status: AC
  Filled 2014-11-28: qty 1

## 2014-11-28 MED ORDER — LACTATED RINGERS IV SOLN
INTRAVENOUS | Status: DC | PRN
  Administered 2014-11-28: 09:00:00 via INTRAVENOUS

## 2014-11-28 MED ORDER — CEFAZOLIN SODIUM-DEXTROSE 2-3 GM-% IV SOLR
INTRAVENOUS | Status: AC
  Filled 2014-11-28: qty 50

## 2014-11-28 MED ORDER — LIDOCAINE HCL (CARDIAC) 20 MG/ML IV SOLN
INTRAVENOUS | Status: AC
  Filled 2014-11-28: qty 5

## 2014-11-28 MED ORDER — ONDANSETRON HCL 4 MG/2ML IJ SOLN
INTRAMUSCULAR | Status: AC
  Filled 2014-11-28: qty 2

## 2014-11-28 MED ORDER — 0.9 % SODIUM CHLORIDE (POUR BTL) OPTIME
TOPICAL | Status: DC | PRN
  Administered 2014-11-28: 1000 mL

## 2014-11-28 MED ORDER — MIDAZOLAM HCL 2 MG/2ML IJ SOLN
INTRAMUSCULAR | Status: AC
  Filled 2014-11-28: qty 2

## 2014-11-28 SURGICAL SUPPLY — 47 items
BANDAGE ELASTIC 4 VELCRO ST LF (GAUZE/BANDAGES/DRESSINGS) IMPLANT
BANDAGE ELASTIC 6 VELCRO ST LF (GAUZE/BANDAGES/DRESSINGS) IMPLANT
BNDG GAUZE ELAST 4 BULKY (GAUZE/BANDAGES/DRESSINGS) IMPLANT
CANISTER SUCTION 2500CC (MISCELLANEOUS) ×4 IMPLANT
CANISTER WOUND CARE 500ML ATS (WOUND CARE) ×4 IMPLANT
CLIP TI MEDIUM 24 (CLIP) ×2 IMPLANT
CLIP TI MEDIUM 6 (CLIP) ×2 IMPLANT
CLIP TI WIDE RED SMALL 24 (CLIP) ×2 IMPLANT
CLIP TI WIDE RED SMALL 6 (CLIP) ×2 IMPLANT
CONNECTOR Y ATS VAC SYSTEM (MISCELLANEOUS) ×2 IMPLANT
DRAPE INCISE IOBAN 66X45 STRL (DRAPES) IMPLANT
DRAPE ORTHO SPLIT 77X108 STRL (DRAPES)
DRAPE PROXIMA HALF (DRAPES) IMPLANT
DRAPE SURG ORHT 6 SPLT 77X108 (DRAPES) IMPLANT
DRSG MEPITEL 3X4 ME34 (GAUZE/BANDAGES/DRESSINGS) ×12 IMPLANT
DRSG VAC ATS LRG SENSATRAC (GAUZE/BANDAGES/DRESSINGS) ×2 IMPLANT
DRSG VAC ATS SM SENSATRAC (GAUZE/BANDAGES/DRESSINGS) ×4 IMPLANT
ELECT REM PT RETURN 9FT ADLT (ELECTROSURGICAL) ×4
ELECTRODE REM PT RTRN 9FT ADLT (ELECTROSURGICAL) ×2 IMPLANT
GAUZE SPONGE 4X4 12PLY STRL (GAUZE/BANDAGES/DRESSINGS) ×2 IMPLANT
GLOVE BIO SURGEON STRL SZ 6.5 (GLOVE) ×1 IMPLANT
GLOVE BIO SURGEON STRL SZ7.5 (GLOVE) ×4 IMPLANT
GLOVE BIO SURGEONS STRL SZ 6.5 (GLOVE) ×1
GLOVE BIOGEL PI IND STRL 6.5 (GLOVE) IMPLANT
GLOVE BIOGEL PI IND STRL 7.5 (GLOVE) IMPLANT
GLOVE BIOGEL PI IND STRL 8 (GLOVE) ×2 IMPLANT
GLOVE BIOGEL PI INDICATOR 6.5 (GLOVE) ×4
GLOVE BIOGEL PI INDICATOR 7.5 (GLOVE) ×2
GLOVE BIOGEL PI INDICATOR 8 (GLOVE) ×2
GLOVE SURG SS PI 7.5 STRL IVOR (GLOVE) ×2 IMPLANT
GOWN STRL REUS W/ TWL LRG LVL3 (GOWN DISPOSABLE) ×6 IMPLANT
GOWN STRL REUS W/TWL LRG LVL3 (GOWN DISPOSABLE) ×16
KIT BASIN OR (CUSTOM PROCEDURE TRAY) ×4 IMPLANT
KIT ROOM TURNOVER OR (KITS) ×4 IMPLANT
NS IRRIG 1000ML POUR BTL (IV SOLUTION) ×4 IMPLANT
PACK CV ACCESS (CUSTOM PROCEDURE TRAY) IMPLANT
PACK GENERAL/GYN (CUSTOM PROCEDURE TRAY) IMPLANT
PACK PERIPHERAL VASCULAR (CUSTOM PROCEDURE TRAY) ×2 IMPLANT
PACK UNIVERSAL I (CUSTOM PROCEDURE TRAY) ×2 IMPLANT
PAD ARMBOARD 7.5X6 YLW CONV (MISCELLANEOUS) ×8 IMPLANT
PAD NEG PRESSURE SENSATRAC (MISCELLANEOUS) ×2 IMPLANT
SUT ETHILON 3 0 PS 1 (SUTURE) IMPLANT
SUT VIC AB 2-0 CTB1 (SUTURE) ×2 IMPLANT
SUT VIC AB 3-0 SH 27 (SUTURE) ×4
SUT VIC AB 3-0 SH 27X BRD (SUTURE) IMPLANT
SUT VICRYL 4-0 PS2 18IN ABS (SUTURE) ×2 IMPLANT
WATER STERILE IRR 1000ML POUR (IV SOLUTION) ×4 IMPLANT
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
## 2014-11-28 NOTE — Progress Notes (Signed)
VASCULAR LAB PRELIMINARY  PRELIMINARY  PRELIMINARY  PRELIMINARY  Right upper extremity venous duplex completed.    Preliminary report:  There is no DVT noted in the visualized veins of the right upper extremity.  There is superficial thrombosis noted in the basilic vein.  Caleb [name], RVT 11/28/2014, 1:37 PM

## 2014-11-28 NOTE — Progress Notes (Addendum)
   VASCULAR SURGERY ASSESSMENT & PLAN:  * 3 Day Post-Op s/p:  1. Repair of left femoral vein with interposition R GSV 2. Left SFA to SFA bypass with 6 mm PTFE  * His left leg swelling seems worse. I am concerned about a compartment syndrome especially given his worsening renal failure and have recommended 4 compartment fasciotomy. I have discussed with the mother who is agreeable and is coming in to sign the permit.   * Thrombocytopenia: Slightly improved (PLT = 64 K). No heparin. Will need to proceed with surgery despite low platelet count given the risk of possible compartment syndrome.   * ARF: [facility]  * JP with 80 cc lymphatic drainage for 12 hours. Still too much drainage to pull JP    SUBJECTIVE: Sedated on vent.   PHYSICAL EXAM: Filed Vitals:   11/28/14 0300 11/28/14 0400 11/28/14 0500 11/28/14 0600  BP: 124/62 135/68 134/62 130/62  Pulse: 87 87 82 81  Temp:  99 F (37.2 C)    TempSrc:  Oral    Resp: 12 12 12 12   Height:      Weight:      SpO2: 100% 100% 100% 100%   Brisk left PT signal with doppler. Moderate left leg swelling, but seems to be getting worse.  Incision right thigh looks fine Incision left groin looks fine.  LABS: Lab Results  Component Value Date   WBC 10.3 11/28/2014   HGB 10.8* 11/28/2014   HCT 32.0* 11/28/2014   MCV 88.2 11/28/2014   PLT 64* 11/28/2014   Lab Results  Component Value Date   CREATININE 8.07* 11/28/2014   Lab Results  Component Value Date   INR 1.49 11/26/2014    Active Problems:   Hemorrhagic shock   Acute respiratory failure with hypoxia   Acute post-hemorrhagic anemia   Gae Gallop Beeper: [phone] 11/28/2014

## 2014-11-28 NOTE — Transfer of Care (Signed)
Immediate Anesthesia Transfer of Care Note  Patient: Caleb [name]  Procedure(s) Performed: Procedure(s): LEFT UPPER AND LOWER LEG FASCIOTOMY (Left) APPLICATION OF WOUND VAC ON LEFT LOWER AND UPPER LEG (Left)  Patient Location: SICU  Anesthesia Type:General  Level of Consciousness: sedated and Patient remains intubated per anesthesia plan  Airway & Oxygen Therapy: Patient remains intubated per anesthesia plan and Patient placed on Ventilator (see vital sign flow sheet for setting)  Post-op Assessment: Report given to RN and Post -op Vital signs reviewed and stable  Post vital signs: Reviewed and stable  Last Vitals:  Filed Vitals:   11/28/14 0900  BP: 125/70  Pulse: 95  Temp:   Resp: 24    Complications: No apparent anesthesia complications

## 2014-11-28 NOTE — Anesthesia Preprocedure Evaluation (Signed)
Anesthesia Evaluation  Patient identified by MRN, date of birth, ID band Patient unresponsive    Reviewed: Patient's Chart, lab work & pertinent test resultsPreop documentation limited or incomplete due to emergent nature of procedure.  Airway Mallampati: Intubated      Comment: Pt already intubated. Dental   Pulmonary          Cardiovascular Rate:Tachycardia     Neuro/Psych    GI/Hepatic   Endo/Other    Renal/GU ARFRenal disease     Musculoskeletal   Abdominal   Peds  Hematology  (+) anemia , Thrombocytopenia   Anesthesia Other Findings   Reproductive/Obstetrics                             Anesthesia Physical  Anesthesia Plan  ASA: IV and emergent  Anesthesia Plan: General   Post-op Pain Management:    Induction: Intravenous  Airway Management Planned: Oral ETT  Additional Equipment: Arterial line and CVP  Intra-op Plan:   Post-operative Plan: Post-operative intubation/ventilation  Informed Consent: I have reviewed the patients History and Physical, chart, labs and discussed the procedure including the risks, benefits and alternatives for the proposed anesthesia with the patient or authorized representative who has indicated his/her understanding and acceptance.     Plan Discussed with: CRNA, Anesthesiologist and Surgeon  Anesthesia Plan Comments: (Emergency case.  )        Anesthesia Quick Evaluation

## 2014-11-28 NOTE — Anesthesia Postprocedure Evaluation (Signed)
  Anesthesia Post-op Note  Patient: Caleb [name]  Procedure(s) Performed: Procedure(s): LEFT UPPER AND LOWER LEG FASCIOTOMY (Left) APPLICATION OF WOUND VAC ON LEFT LOWER AND UPPER LEG (Left)  Patient Location: SICU  Anesthesia Type:General  Level of Consciousness: sedated and Patient remains intubated per anesthesia plan  Airway and Oxygen Therapy: Patient remains intubated per anesthesia plan and Patient placed on Ventilator (see vital sign flow sheet for setting)  Post-op Pain: none  Post-op Assessment: Post-op Vital signs reviewed, Patient's Cardiovascular Status Stable, Respiratory Function Stable, Patent Airway and Pain level controlled LLE Motor Response: Non-purposeful movement   RLE Motor Response: Non-purposeful movement        Post-op Vital Signs: Reviewed and stable  Last Vitals:  Filed Vitals:   11/28/14 0900  BP: 125/70  Pulse: 95  Temp:   Resp: 24    Complications: No apparent anesthesia complications
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
## 2014-11-29 ENCOUNTER — Encounter (HOSPITAL_COMMUNITY): Payer: Self-pay | Admitting: Vascular Surgery

## 2014-11-29 LAB — TYPE AND SCREEN
ABO/RH(D): A POS
Antibody Screen: NEGATIVE
UNIT DIVISION: 0
UNIT DIVISION: 0
UNIT DIVISION: 0
UNIT DIVISION: 0
UNIT DIVISION: 0
UNIT DIVISION: 0
UNIT DIVISION: 0
UNIT DIVISION: 0
UNIT DIVISION: 0
UNIT DIVISION: 0
UNIT DIVISION: 0
UNIT DIVISION: 0
UNIT DIVISION: 0
UNIT DIVISION: 0
UNIT DIVISION: 0
Unit division: 0
Unit division: 0
Unit division: 0
Unit division: 0
Unit division: 0
Unit division: 0
Unit division: 0
Unit division: 0
Unit division: 0
Unit division: 0
Unit division: 0
Unit division: 0
Unit division: 0
Unit division: 0
Unit division: 0

## 2014-11-29 LAB — CBC
HEMATOCRIT: 27.7 % — AB (ref 39.0–52.0)
Hemoglobin: 9.2 g/dL — ABNORMAL LOW (ref 13.0–17.0)
MCH: 29.5 pg (ref 26.0–34.0)
MCHC: 33.2 g/dL (ref 30.0–36.0)
MCV: 88.8 fL (ref 78.0–100.0)
PLATELETS: 82 10*3/uL — AB (ref 150–400)
RBC: 3.12 MIL/uL — ABNORMAL LOW (ref 4.22–5.81)
RDW: 14.3 % (ref 11.5–15.5)
WBC: 9.7 10*3/uL (ref 4.0–10.5)

## 2014-11-29 LAB — GLUCOSE, CAPILLARY
GLUCOSE-CAPILLARY: 124 mg/dL — AB (ref 65–99)
GLUCOSE-CAPILLARY: 129 mg/dL — AB (ref 65–99)
Glucose-Capillary: 108 mg/dL — ABNORMAL HIGH (ref 65–99)
Glucose-Capillary: 112 mg/dL — ABNORMAL HIGH (ref 65–99)
Glucose-Capillary: 112 mg/dL — ABNORMAL HIGH (ref 65–99)
Glucose-Capillary: 113 mg/dL — ABNORMAL HIGH (ref 65–99)
Glucose-Capillary: 125 mg/dL — ABNORMAL HIGH (ref 65–99)
Glucose-Capillary: 94 mg/dL (ref 65–99)

## 2014-11-29 LAB — BASIC METABOLIC PANEL
Anion gap: 12 (ref 5–15)
BUN: 39 mg/dL — AB (ref 6–20)
CO2: 33 mmol/L — ABNORMAL HIGH (ref 22–32)
CREATININE: 10.23 mg/dL — AB (ref 0.61–1.24)
Calcium: 6.7 mg/dL — ABNORMAL LOW (ref 8.9–10.3)
Chloride: 97 mmol/L — ABNORMAL LOW (ref 101–111)
GFR, EST AFRICAN AMERICAN: 7 mL/min — AB (ref >60)
GFR, EST NON AFRICAN AMERICAN: 6 mL/min — AB (ref >60)
Glucose, Bld: 136 mg/dL — ABNORMAL HIGH (ref 65–99)
POTASSIUM: 3.8 mmol/L (ref 3.5–5.1)
SODIUM: 142 mmol/L (ref 135–145)

## 2014-11-29 LAB — CK: CK TOTAL: 37935 U/L — AB (ref 49–397)

## 2014-11-29 LAB — BLOOD PRODUCT ORDER (VERBAL) VERIFICATION

## 2014-11-29 LAB — MYOGLOBIN, URINE: Myoglobin, Ur: 78 ng/mL — ABNORMAL HIGH (ref 0–13)

## 2014-11-29 MED ORDER — ENOXAPARIN SODIUM 40 MG/0.4ML ~~LOC~~ SOLN
40.0000 mg | SUBCUTANEOUS | Status: DC
  Administered 2014-11-29: 40 mg via SUBCUTANEOUS
  Filled 2014-11-29: qty 0.4

## 2014-11-29 MED ORDER — ENOXAPARIN SODIUM 30 MG/0.3ML ~~LOC~~ SOLN
30.0000 mg | SUBCUTANEOUS | Status: DC
  Administered 2014-11-30: 30 mg via SUBCUTANEOUS
  Filled 2014-11-29 (×2): qty 0.3

## 2014-11-29 MED ORDER — FUROSEMIDE 10 MG/ML IJ SOLN
160.0000 mg | Freq: Four times a day (QID) | INTRAMUSCULAR | Status: DC
  Administered 2014-11-29 – 2014-12-07 (×33): 160 mg via INTRAVENOUS
  Filled 2014-11-29 (×39): qty 16

## 2014-11-29 MED FILL — Thrombin For Soln 20000 Unit: CUTANEOUS | Qty: 1 | Status: AC
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
## 2014-11-29 NOTE — Progress Notes (Signed)
Follow up - Trauma and Critical Care  Patient Details:    Caleb [name] is an 24 y.o. male.  Lines/tubes : Airway 7.5 mm (Active)  Secured at (cm) 25 cm 11/29/2014  7:50 AM  Measured From Lips 11/29/2014  7:50 AM  Secured Location Left 11/29/2014  7:50 AM  Secured By [employer] 11/29/2014  7:50 AM  Tube Holder Repositioned Yes 11/29/2014  7:50 AM  Cuff Pressure (cm H2O) 26 cm H2O 11/29/2014  3:35 AM  Site Condition Dry 11/29/2014  7:50 AM     CVC Triple Lumen 11/25/14 Left Subclavian (Active)  Indication for Insertion or Continuance of Line Limited venous access - need for IV therapy >5 days (PICC only) 11/28/2014  8:00 PM  Site Assessment Clean;Dry;Intact 11/28/2014  8:00 PM  Proximal Lumen Status Infusing 11/28/2014  8:00 PM  Medial Infusing 11/28/2014  8:00 PM  Distal Lumen Status Infusing 11/28/2014  8:00 PM  Dressing Type Transparent;Gauze 11/28/2014  8:00 PM  Dressing Status Clean;Dry;Intact;Antimicrobial disc in place 11/28/2014  8:00 PM  Line Care Connections checked and tightened 11/28/2014  8:00 PM  Dressing Intervention Dressing changed;Antimicrobial disc changed 11/26/2014  2:00 AM  Dressing Change Due 12/03/14 11/28/2014  8:00 AM     Arterial Line 11/25/14 Left Radial (Active)  Site Assessment Clean;Dry;Intact 11/28/2014  8:00 PM  Line Status Pulsatile blood flow 11/28/2014  8:00 PM  Art Line Waveform Appropriate 11/28/2014  8:00 PM  Art Line Interventions Zeroed and calibrated;Leveled 11/28/2014  8:00 PM  Color/Movement/Sensation Capillary refill less than 3 sec 11/28/2014  8:00 PM  Dressing Type Transparent 11/28/2014  8:00 PM  Dressing Status Clean;Dry;Intact 11/28/2014  8:00 PM  Dressing Change Due 12/02/14 11/28/2014  8:00 AM     Closed System Drain 1 Left Groin Bulb (JP) 15 Fr. (Active)  Site Description Unable to view 11/29/2014  8:00 AM  Dressing Status Clean;Dry;Intact 11/29/2014  8:00 AM  Drainage Appearance Serosanguineous 11/29/2014  8:00 AM  Status To suction (Charged) 11/29/2014  8:00 AM   Intake (mL) 40 ml 11/28/2014  6:00 PM  Output (mL) 35 mL 11/29/2014  6:00 AM     Negative Pressure Wound Therapy Leg Lateral;Proximal;Distal (Active)  Site / Wound Assessment Dressing in place / Unable to assess 11/29/2014  8:00 AM  Peri-wound Assessment Intact 11/29/2014  8:00 AM  Cycle Continuous 11/29/2014  8:00 AM  Target Pressure (mmHg) 125 11/29/2014  8:00 AM  Dressing Status Intact 11/29/2014  8:00 AM  Drainage Amount Moderate 11/29/2014  8:00 AM  Drainage Description Serosanguineous 11/29/2014  8:00 AM  Output (mL) 0 mL 11/29/2014  6:00 AM     Negative Pressure Wound Therapy Leg Medial;Proximal;Distal (Active)  Site / Wound Assessment Dressing in place / Unable to assess 11/29/2014  8:00 AM  Peri-wound Assessment Intact 11/29/2014  8:00 AM  Cycle Continuous 11/29/2014  8:00 AM  Target Pressure (mmHg) 125 11/29/2014  8:00 AM  Dressing Status Intact 11/29/2014  8:00 AM  Drainage Amount Moderate 11/29/2014  8:00 AM  Drainage Description Serosanguineous 11/29/2014  8:00 AM  Output (mL) 50 mL 11/29/2014  6:00 AM     NG/OG Tube Orogastric 14 Fr. Right mouth (Active)  Placement Verification Auscultation 11/29/2014  8:00 AM  Site Assessment Clean;Dry;Intact 11/29/2014  8:00 AM  Status Infusing tube feed;Irrigated 11/29/2014  8:00 AM  Drainage Appearance Owens Shark;Thin 11/27/2014  8:00 AM  Gastric Residual 150 mL 11/29/2014  4:00 AM  Intake (mL) 210 mL 11/28/2014  3:00 PM  Output (mL) 100  mL 11/27/2014  6:00 AM     Urethral Catheter C.Simmons,RN Latex;Straight-tip 16 Fr. (Active)  Indication for Insertion or Continuance of Catheter Unstable critical patients (first 24-48 hours) 11/29/2014  8:00 AM  Site Assessment Clean;Intact 11/29/2014  8:00 AM  Catheter Maintenance Bag below level of bladder;Catheter secured;Drainage bag/tubing not touching floor;Insertion date on drainage bag;No dependent loops;Seal intact 11/29/2014  8:00 AM  Collection Container Standard drainage bag 11/29/2014  8:00 AM  Securement Method Leg strap 11/29/2014  8:00 AM   Output (mL) 40 mL 11/29/2014  7:00 AM    Microbiology/Sepsis markers: Results for orders placed or performed during the hospital encounter of 11/25/14  MRSA PCR Screening     Status: None   Collection Time: 11/25/14 10:00 AM  Result Value Ref Range Status   MRSA by PCR NEGATIVE NEGATIVE Final    Comment:        The GeneXpert MRSA Assay (FDA approved for NASAL specimens only), is one component of a comprehensive MRSA colonization surveillance program. It is not intended to diagnose MRSA infection nor to guide or monitor treatment for MRSA infections.     Anti-infectives:  Anti-infectives    None      Best Practice/Protocols:  VTE Prophylaxis: Mechanical GI Prophylaxis: Proton Pump Inhibitor Continous Sedation  Consults: Treatment Team:  Conrad [place], MD    Events:  Subjective:    Overnight Issues: Patient is able to be awakened, no distress.  Will follow commands.  Objective:  Vital signs for last 24 hours: Temp:  [97.7 F (36.5 C)-99.8 F (37.7 C)] 98.8 F (37.1 C) (09/06 0358) Pulse Rate:  [80-119] 119 (09/06 0750) Resp:  [12-24] 16 (09/06 0750) BP: (111-178)/(60-87) 178/87 mmHg (09/06 0750) SpO2:  [100 %] 100 % (09/06 0750) Arterial Line BP: (112-177)/(55-79) 145/67 mmHg (09/06 0700) FiO2 (%):  [30 %] 30 % (09/06 0750) Weight:  [107.1 kg (236 lb 1.8 oz)] 107.1 kg (236 lb 1.8 oz) (09/06 0400)  Hemodynamic parameters for last 24 hours:    Intake/Output from previous day: 09/05 0701 - 09/06 0700 In: 4569.8 [I.V.:4014.8; NG/GT:465] Out: 3120 [Urine:1200; Drains:1720; Blood:200]  Intake/Output this shift:    Vent settings for last 24 hours: Vent Mode:  [-] PRVC FiO2 (%):  [30 %] 30 % Set Rate:  [12 bmp] 12 bmp Vt Set:  [600 mL] 600 mL PEEP:  [5 cmH20] 5 cmH20 Plateau Pressure:  [18 cmH20-23 cmH20] 23 cmH20  Physical Exam:  General: alert and no respiratory distress Neuro: alert, oriented and nonfocal exam Resp: clear to auscultation  bilaterally CVS: Tachycardic with flow murmur at the LLSB.  No wheezing, rales or rhonchi. GI: soft, nontender, BS WNL, no r/g and has been having high residuals from the tube feedings. Extremities: edema 4+, pulses doppler,  and can palpate the PT on the left leg/foot  Results for orders placed or performed during the hospital encounter of 11/25/14 (from the past 24 hour(s))  Glucose, capillary     Status: Abnormal   Collection Time: 11/28/14  3:21 PM  Result Value Ref Range   Glucose-Capillary 108 (H) 65 - 99 mg/dL   Comment 1 Notify RN   Glucose, capillary     Status: Abnormal   Collection Time: 11/28/14  8:33 PM  Result Value Ref Range   Glucose-Capillary 129 (H) 65 - 99 mg/dL   Comment 1 Notify RN    Comment 2 Document in Chart   Glucose, capillary     Status: Abnormal   Collection Time:  11/29/14 12:12 AM  Result Value Ref Range   Glucose-Capillary 125 (H) 65 - 99 mg/dL   Comment 1 Notify RN    Comment 2 Document in Chart   Glucose, capillary     Status: Abnormal   Collection Time: 11/29/14  3:56 AM  Result Value Ref Range   Glucose-Capillary 113 (H) 65 - 99 mg/dL   Comment 1 Notify RN    Comment 2 Document in Chart   CK     Status: Abnormal   Collection Time: 11/29/14  4:40 AM  Result Value Ref Range   Total CK 379[address] (H) 49 - 397 U/L  Basic metabolic panel     Status: Abnormal   Collection Time: 11/29/14  4:40 AM  Result Value Ref Range   Sodium 142 135 - 145 mmol/L   Potassium 3.8 3.5 - 5.1 mmol/L   Chloride 97 (L) 101 - 111 mmol/L   CO2 33 (H) 22 - 32 mmol/L   Glucose, Bld 136 (H) 65 - 99 mg/dL   BUN 39 (H) 6 - 20 mg/dL   Creatinine, Ser 10.23 (H) 0.61 - 1.24 mg/dL   Calcium 6.7 (L) 8.9 - 10.3 mg/dL   GFR calc non Af Amer 6 (L) >60 mL/min   GFR calc Af Amer 7 (L) >60 mL/min   Anion gap 12 5 - 15  CBC     Status: Abnormal   Collection Time: 11/29/14  4:40 AM  Result Value Ref Range   WBC 9.7 4.0 - 10.5 K/uL   RBC 3.12 (L) 4.22 - 5.81 MIL/uL   Hemoglobin 9.2  (L) 13.0 - 17.0 g/dL   HCT 27.7 (L) 39.0 - 52.0 %   MCV 88.8 78.0 - 100.0 fL   MCH 29.5 26.0 - 34.0 pg   MCHC 33.2 30.0 - 36.0 g/dL   RDW 14.3 11.5 - 15.5 %   Platelets 82 (L) 150 - 400 K/uL     Assessment/Plan:   NEURO  Altered Mental Status:  agitation and sedation   Plan: May hold sedation a bit in order to wean on the ventilator, but not extubate  PULM  No obvious problems, ventilated because of the other systemic problems.  No CXR today.   Plan: Wean but will not extubate until we are sure he will not need CRRT  CARDIO  Sinus Tachycardia   Plan: Also has a flow murmur but will follow.  Tachycardia is physiologic  RENAL  Oliguria (suspect ATN) Actue Renal Failure (due to rhabdomyolysis and compartment syndrome with ischemia)   Plan: Continue Lasix, bicarbonate drip, volume resuscitation  GI  Mild ileus.  Monitor residuals and tube feedings.   Plan: Continue tube feedings at the low level for now.  ID  No known infectious sources.   Plan: Prophylactic antibiotics  HEME  Anemia acute blood loss anemia)   Plan: Does not require transfusion at this time.  ENDO No known issues.   Plan: CPM  Global Issues  Creatinine is elevated this AM, but still making a fair amount of urine.  Nephrology service will hold off on CRRT for now.  Respiratory the patient is doing fine, but wary of extubating this patient because of concerns about overall volume status and still possible need for CRRT    LOS: 4 days   Additional comments:I reviewed the patient's new clinical lab test results. cbc/bmet  Critical Care Total Time*: 30 Minutes  Mahagony Grieb 11/29/2014  *Care during the described time interval was provided by  me and/or other providers on the critical care team.  I have reviewed this patient's available data, including medical history, events of note, physical examination and test results as part of my evaluation.

## 2014-11-29 NOTE — Progress Notes (Signed)
Dr Barry Dienes paged, updated regarding pt's emesis, and feeds being held. New orders received feeds d/c. Keep to [facility]

## 2014-11-30 ENCOUNTER — Inpatient Hospital Stay (HOSPITAL_COMMUNITY): Payer: 59

## 2014-11-30 LAB — CBC WITH DIFFERENTIAL/PLATELET
BASOS ABS: 0 10*3/uL (ref 0.0–0.1)
BASOS PCT: 0 % (ref 0–1)
EOS ABS: 0.4 10*3/uL (ref 0.0–0.7)
Eosinophils Relative: 3 % (ref 0–5)
HEMATOCRIT: 26 % — AB (ref 39.0–52.0)
HEMOGLOBIN: 8.6 g/dL — AB (ref 13.0–17.0)
Lymphocytes Relative: 7 % — ABNORMAL LOW (ref 12–46)
Lymphs Abs: 0.8 10*3/uL (ref 0.7–4.0)
MCH: 29.6 pg (ref 26.0–34.0)
MCHC: 33.1 g/dL (ref 30.0–36.0)
MCV: 89.3 fL (ref 78.0–100.0)
Monocytes Absolute: 1.2 10*3/uL — ABNORMAL HIGH (ref 0.1–1.0)
Monocytes Relative: 11 % (ref 3–12)
NEUTROS ABS: 9.1 10*3/uL — AB (ref 1.7–7.7)
NEUTROS PCT: 79 % — AB (ref 43–77)
Platelets: 96 10*3/uL — ABNORMAL LOW (ref 150–400)
RBC: 2.91 MIL/uL — ABNORMAL LOW (ref 4.22–5.81)
RDW: 14.4 % (ref 11.5–15.5)
WBC: 11.6 10*3/uL — AB (ref 4.0–10.5)

## 2014-11-30 LAB — GLUCOSE, CAPILLARY
GLUCOSE-CAPILLARY: 104 mg/dL — AB (ref 65–99)
GLUCOSE-CAPILLARY: 109 mg/dL — AB (ref 65–99)
GLUCOSE-CAPILLARY: 102 mg/dL — AB (ref 65–99)
GLUCOSE-CAPILLARY: 95 mg/dL (ref 65–99)
Glucose-Capillary: 103 mg/dL — ABNORMAL HIGH (ref 65–99)
Glucose-Capillary: 93 mg/dL (ref 65–99)

## 2014-11-30 LAB — RENAL FUNCTION PANEL
ALBUMIN: 2 g/dL — AB (ref 3.5–5.0)
ANION GAP: 14 (ref 5–15)
BUN: 57 mg/dL — ABNORMAL HIGH (ref 6–20)
CALCIUM: 6.8 mg/dL — AB (ref 8.9–10.3)
CO2: 39 mmol/L — ABNORMAL HIGH (ref 22–32)
CREATININE: 11.77 mg/dL — AB (ref 0.61–1.24)
Chloride: 87 mmol/L — ABNORMAL LOW (ref 101–111)
GFR, EST AFRICAN AMERICAN: 6 mL/min — AB (ref >60)
GFR, EST NON AFRICAN AMERICAN: 5 mL/min — AB (ref >60)
Glucose, Bld: 116 mg/dL — ABNORMAL HIGH (ref 65–99)
PHOSPHORUS: 9.4 mg/dL — AB (ref 2.5–4.6)
Potassium: 3.8 mmol/L (ref 3.5–5.1)
SODIUM: 140 mmol/L (ref 135–145)

## 2014-11-30 LAB — POCT I-STAT 3, ART BLOOD GAS (G3+)
Acid-Base Excess: 19 mmol/L — ABNORMAL HIGH (ref 0.0–2.0)
BICARBONATE: 44.5 meq/L — AB (ref 20.0–24.0)
O2 Saturation: 99 %
PH ART: 7.459 — AB (ref 7.350–7.450)
PO2 ART: 136 mmHg — AB (ref 80.0–100.0)
TCO2: 46 mmol/L (ref 0–100)
pCO2 arterial: 63 mmHg (ref 35.0–45.0)

## 2014-11-30 LAB — CK: CK TOTAL: 23684 U/L — AB (ref 49–397)

## 2014-11-30 MED ORDER — ALTEPLASE 2 MG IJ SOLR
2.0000 mg | Freq: Once | INTRAMUSCULAR | Status: AC
  Administered 2014-11-30: 2 mg
  Filled 2014-11-30: qty 2

## 2014-11-30 MED ORDER — SODIUM CHLORIDE 0.9 % IV SOLN
Freq: Once | INTRAVENOUS | Status: AC
  Administered 2014-11-30: 500 mL via INTRAVENOUS

## 2014-11-30 MED ORDER — SODIUM CHLORIDE 0.9 % IV SOLN
INTRAVENOUS | Status: DC
  Administered 2014-11-30: 18:00:00 via INTRAVENOUS

## 2014-11-30 MED ORDER — ANTISEPTIC ORAL RINSE SOLUTION (CORINZ)
7.0000 mL | OROMUCOSAL | Status: DC
  Administered 2014-11-30 – 2014-12-10 (×103): 7 mL via OROMUCOSAL
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
## 2014-11-30 NOTE — Progress Notes (Signed)
Wound vac dressing changed to proximal left leg equipment working  Draining without difficulty

## 2014-11-30 NOTE — Progress Notes (Signed)
Pts wound vacuums remain on hold call placed to wound care team regarding trouble shooting of system.

## 2014-11-30 NOTE — Progress Notes (Signed)
Dr. Hulen Skains paged about patient's decrease in urine output (15-62ml/hr) throughout the day. Orders given to administer at 500cc NS bolus and start NS at 47ml/hr. Will continue to monitor.
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
## 2014-11-30 NOTE — Progress Notes (Signed)
Spoke with Dr Annamaria Boots regarding wound vacuum drainage.

## 2014-11-30 NOTE — Progress Notes (Signed)
Patient ID: Caleb [name], male   DOB: 1990/04/17, 24 y.o.   MRN: [id number] Called to see patient due to concern of bleeding from [facility] sites. Has some mild oozing from all VAC sites.  Groin incision with dressing intact and no bleeding. RemovedVAC from lateral thigh site. There was some clot present at this dressing only. No significant bleeding noted. Mild oozing from skin edges  Dressing reapplied. Platelet counts somewhat low and had massive transfusion less than one week ago. Also is on Lovenox. No serious bleeding. Continue to monitor.

## 2014-11-30 NOTE — Consult Note (Addendum)
WOC wound consult note Reason for Consult: Consult requested for left leg Vac dressing changes.  VVS PA called and was planning to be present for the first post-op dressing change but got tied up in the OR.  She requests that [place] team assess wounds and apply new Vac dressings. Bedside nurse states she discussed bloody drainage with [facility] team. Wound type: 4 full thickness post-op wounds: Left outer thigh full thickness wound; 12X5X.3cm; 100% beefy red. Mod amt clotted blood underneath previous dressing, small amt bloody drainage from wound bed.   Left inner thigh full thickness wound; 8X4X1cm; 100% beefy red. Mod amt clotted blood underneath previous dressing, small amt bloody drainage from wound bed.  Left outer calf full thickness fasciotomy; 22X8X.3cm, 100% beefy red, exposed muscles and tendons. Mod amt clotted blood underneath previous dressing, small amt bloody drainage from wound bed.  Left inner calf full thickness fasciotomy; 17.5X7X.3cm, 100% beefy red, exposed muscles and tendons. Mod amt clotted blood underneath previous dressing, small amt bloody drainage from wound bed.  Drainage (amount, consistency, odor) Mod amt clotted blood in previous canisters. Periwound: Generalized edema to LLE Dressing procedure/placement/frequency: Applied Mepitel contact layer over each wound bed to reduce adherence of Vac dressings, then applied one piece black foam and drape to each site.  2 wounds Yed together to 2 separate machines.  Good seal obtained at 121mm cont suction.  Pt was sedated and intubated; he was medicated with extra pain meds for procedure, but still was grimacing. [place] team will plan to assist bedside nurse with dressing change on Friday. This consult took greater than one hour to perform. Julien Girt MSN, RN, [place], [place], [place]

## 2014-11-30 NOTE — Progress Notes (Signed)
Follow up - Trauma and Critical Care  Patient Details:    Caleb [name] is an 24 y.o. male.  Lines/tubes : Airway 7.5 mm (Active)  Secured at (cm) 25 cm 11/30/2014  3:32 AM  Measured From Lips 11/30/2014  3:32 AM  Secured Location Right 11/30/2014  3:32 AM  Secured By [employer] 11/30/2014  3:32 AM  Tube Holder Repositioned Yes 11/30/2014  3:32 AM  Cuff Pressure (cm H2O) 26 cm H2O 11/30/2014  3:32 AM  Site Condition Dry 11/30/2014  3:32 AM     CVC Triple Lumen 11/25/14 Left Subclavian (Active)  Indication for Insertion or Continuance of Line Limited venous access - need for IV therapy >5 days (PICC only) 11/29/2014  8:00 PM  Site Assessment Clean;Dry;Intact 11/29/2014  8:00 PM  Proximal Lumen Status Infusing;Flushed;Blood return noted 11/29/2014  8:00 PM  Medial Infusing;Flushed;Blood return noted 11/29/2014  8:00 PM  Distal Lumen Status Infusing;Flushed;Blood return noted 11/29/2014  8:00 PM  Dressing Type Transparent;Gauze 11/29/2014  8:00 PM  Dressing Status Clean;Dry;Intact;Antimicrobial disc in place 11/29/2014  8:00 PM  Line Care Connections checked and tightened 11/29/2014  8:00 PM  Dressing Intervention Dressing changed;Antimicrobial disc changed 11/26/2014  2:00 AM  Dressing Change Due 12/03/14 11/29/2014  8:00 AM     Arterial Line 11/25/14 Left Radial (Active)  Site Assessment Clean;Dry;Intact 11/29/2014  8:00 PM  Line Status Pulsatile blood flow 11/29/2014  8:00 PM  Art Line Waveform Appropriate;Square wave test performed 11/29/2014  8:00 PM  Art Line Interventions Zeroed and calibrated;Leveled;Connections checked and tightened;Flushed per protocol 11/29/2014  8:00 PM  Color/Movement/Sensation Capillary refill less than 3 sec 11/29/2014  8:00 PM  Dressing Type Transparent;Occlusive 11/29/2014  8:00 PM  Dressing Status Clean;Dry;Intact 11/29/2014  8:00 PM  Dressing Change Due 12/02/14 11/29/2014  8:00 AM     Closed System Drain 1 Left Groin Bulb (JP) 15 Fr. (Active)  Site Description Unremarkable  11/29/2014  8:00 PM  Dressing Status Clean;Dry;Intact;Other (Comment) 11/30/2014  3:00 AM  Drainage Appearance Serosanguineous 11/29/2014  8:00 PM  Status To suction (Charged) 11/29/2014  8:00 PM  Intake (mL) 40 ml 11/28/2014  6:00 PM  Output (mL) 40 mL 11/30/2014  4:00 AM     Negative Pressure Wound Therapy Leg Lateral;Proximal;Distal (Active)  Site / Wound Assessment Dressing in place / Unable to assess 11/29/2014  9:00 PM  Peri-wound Assessment Intact 11/29/2014  9:00 PM  Cycle Continuous 11/29/2014  9:00 PM  Target Pressure (mmHg) 125 11/29/2014  9:00 PM  Canister Changed Yes 11/29/2014  9:00 PM  Dressing Status Intact 11/29/2014  9:00 PM  Drainage Amount Moderate 11/29/2014  9:00 PM  Drainage Description Serosanguineous 11/29/2014  9:00 PM  Output (mL) 100 mL 11/30/2014  4:00 AM     Negative Pressure Wound Therapy Leg Medial;Proximal;Distal (Active)  Site / Wound Assessment Dressing in place / Unable to assess 11/29/2014  8:00 PM  Peri-wound Assessment Intact 11/29/2014  8:00 PM  Cycle Continuous;On 11/29/2014  8:00 PM  Target Pressure (mmHg) 125 11/29/2014  8:00 PM  Dressing Status Intact 11/29/2014  8:00 PM  Drainage Amount Moderate 11/29/2014  8:00 PM  Drainage Description Serosanguineous 11/29/2014  8:00 PM  Output (mL) 50 mL 11/30/2014  6:00 AM     NG/OG Tube Orogastric 14 Fr. Right mouth (Active)  Placement Verification Auscultation 11/30/2014  4:00 AM  Site Assessment Clean;Dry;Intact 11/30/2014  4:00 AM  Status Suction-low intermittent;Irrigated 11/30/2014  4:00 AM  Drainage Appearance Owens Shark;Thin 11/30/2014  4:00 AM  Gastric  Residual 350 mL 11/29/2014 12:00 PM  Intake (mL) 30 mL 11/30/2014  4:00 AM  Output (mL) 0 mL 11/29/2014  6:00 PM     Urethral Catheter C.Simmons,RN Latex;Straight-tip 16 Fr. (Active)  Indication for Insertion or Continuance of Catheter Unstable critical patients (first 24-48 hours);Aggressive IV diuresis 11/29/2014  8:00 PM  Site Assessment Clean;Intact 11/29/2014  8:00 PM  Catheter Maintenance Bag below  level of bladder;Catheter secured;Drainage bag/tubing not touching floor;Insertion date on drainage bag;No dependent loops;Seal intact 11/29/2014  8:00 PM  Collection Container Standard drainage bag 11/29/2014  8:00 PM  Securement Method Leg strap 11/29/2014  8:00 PM  Urinary Catheter Interventions Unclamped 11/29/2014  8:00 PM  Output (mL) 65 mL 11/30/2014  6:00 AM    Microbiology/Sepsis markers: Results for orders placed or performed during the hospital encounter of 11/25/14  MRSA PCR Screening     Status: None   Collection Time: 11/25/14 10:00 AM  Result Value Ref Range Status   MRSA by PCR NEGATIVE NEGATIVE Final    Comment:        The GeneXpert MRSA Assay (FDA approved for NASAL specimens only), is one component of a comprehensive MRSA colonization surveillance program. It is not intended to diagnose MRSA infection nor to guide or monitor treatment for MRSA infections.     Anti-infectives:  Anti-infectives    None      Best Practice/Protocols:  VTE Prophylaxis: Lovenox (prophylaxtic dose) GI Prophylaxis: Proton Pump Inhibitor Continous Sedation  Consults: Treatment Team:  Conrad Harold, MD    Events:  Subjective:    Overnight Issues: Patient vomited yesterday evening.  No apparent aspiration.  Tube feedings being held.  Wean on CP/PS 5/12 all day yesterday.    Objective:  Vital signs for last 24 hours: Temp:  [98.4 F (36.9 C)-100.4 F (38 C)] 99.9 F (37.7 C) (09/07 0733) Pulse Rate:  [79-120] 79 (09/07 0700) Resp:  [0-17] 14 (09/07 0700) BP: (126-179)/(67-92) 136/79 mmHg (09/07 0700) SpO2:  [100 %] 100 % (09/07 0700) Arterial Line BP: (138-190)/(68-86) 158/76 mmHg (09/07 0700) FiO2 (%):  [30 %] 30 % (09/07 0400) Weight:  [109.8 kg (242 lb 1 oz)] 109.8 kg (242 lb 1 oz) (09/07 0500)  Hemodynamic parameters for last 24 hours:    Intake/Output from previous day: 09/06 0701 - 09/07 0700 In: 4177.5 [I.V.:3350.5; NG/GT:645; IV Piggyback:182] Out: 3100  [Urine:785; Emesis/NG output:480; Drains:1835]  Intake/Output this shift:    Vent settings for last 24 hours: Vent Mode:  [-] PRVC FiO2 (%):  [30 %] 30 % Set Rate:  [12 bmp] 12 bmp Vt Set:  [600 mL] 600 mL PEEP:  [5 cmH20] 5 cmH20 Pressure Support:  [12 cmH20] 12 cmH20 Plateau Pressure:  [17 NOM76-72 cmH20] 18 cmH20  Physical Exam:  General: no respiratory distress and Will awaken and follow commands. Neuro: oriented and nonfocal exam Resp: clear to auscultation bilaterally and still on minimal ventilator settings. CVS: murmur and Sinus tachycardia with peersistent flow track murmur GI: hypoactive BS and orogastric tube on LIS.  Did not tolerate tube feedings well at all. Extremities: edema 4+, pulses doppler, , unequal size and Left leg with continued palpable PT pulses.  Seems tighter overall, but CPK is improved forming questionable clots around the NPWT sponges   Results for orders placed or performed during the hospital encounter of 11/25/14 (from the past 24 hour(s))  Glucose, capillary     Status: None   Collection Time: 11/29/14  8:47 AM  Result Value Ref Range  Glucose-Capillary 94 65 - 99 mg/dL   Comment 1 Notify RN   Provider-confirm verbal Blood Bank order - RBC, Platelet Pheresis, FFP, Cryoprecipitate, Type & Screen; >10 Units; Order taken: 11/25/2014; 2:55 AM; Level 1 Trauma, Emergency Release, STAT, MTP; 4 units ahead     Status: None   Collection Time: 11/29/14 10:33 AM  Result Value Ref Range   Blood product order confirm MD AUTHORIZATION REQUESTED   Glucose, capillary     Status: Abnormal   Collection Time: 11/29/14 12:05 PM  Result Value Ref Range   Glucose-Capillary 124 (H) 65 - 99 mg/dL   Comment 1 Notify RN   Glucose, capillary     Status: Abnormal   Collection Time: 11/29/14  3:46 PM  Result Value Ref Range   Glucose-Capillary 129 (H) 65 - 99 mg/dL  Glucose, capillary     Status: Abnormal   Collection Time: 11/29/14  7:11 PM  Result Value Ref Range    Glucose-Capillary 112 (H) 65 - 99 mg/dL   Comment 1 Capillary Specimen    Comment 2 Notify RN    Comment 3 Document in Chart   Glucose, capillary     Status: Abnormal   Collection Time: 11/29/14 11:57 PM  Result Value Ref Range   Glucose-Capillary 112 (H) 65 - 99 mg/dL   Comment 1 Capillary Specimen    Comment 2 Notify RN    Comment 3 Document in Chart   Glucose, capillary     Status: Abnormal   Collection Time: 11/30/14  3:41 AM  Result Value Ref Range   Glucose-Capillary 104 (H) 65 - 99 mg/dL   Comment 1 Capillary Specimen    Comment 2 Notify RN    Comment 3 Document in Chart   Renal function panel     Status: Abnormal   Collection Time: 11/30/14  4:15 AM  Result Value Ref Range   Sodium 140 135 - 145 mmol/L   Potassium 3.8 3.5 - 5.1 mmol/L   Chloride 87 (L) 101 - 111 mmol/L   CO2 39 (H) 22 - 32 mmol/L   Glucose, Bld 116 (H) 65 - 99 mg/dL   BUN 57 (H) 6 - 20 mg/dL   Creatinine, Ser 11.77 (H) 0.61 - 1.24 mg/dL   Calcium 6.8 (L) 8.9 - 10.3 mg/dL   Phosphorus 9.4 (H) 2.5 - 4.6 mg/dL   Albumin 2.0 (L) 3.5 - 5.0 g/dL   GFR calc non Af Amer 5 (L) >60 mL/min   GFR calc Af Amer 6 (L) >60 mL/min   Anion gap 14 5 - 15  CBC with Differential/Platelet     Status: Abnormal   Collection Time: 11/30/14  4:15 AM  Result Value Ref Range   WBC 11.6 (H) 4.0 - 10.5 K/uL   RBC 2.91 (L) 4.22 - 5.81 MIL/uL   Hemoglobin 8.6 (L) 13.0 - 17.0 g/dL   HCT 26.0 (L) 39.0 - 52.0 %   MCV 89.3 78.0 - 100.0 fL   MCH 29.6 26.0 - 34.0 pg   MCHC 33.1 30.0 - 36.0 g/dL   RDW 14.4 11.5 - 15.5 %   Platelets 96 (L) 150 - 400 K/uL   Neutrophils Relative % 79 (H) 43 - 77 %   Neutro Abs 9.1 (H) 1.7 - 7.7 K/uL   Lymphocytes Relative 7 (L) 12 - 46 %   Lymphs Abs 0.8 0.7 - 4.0 K/uL   Monocytes Relative 11 3 - 12 %   Monocytes Absolute 1.2 (H) 0.1 - 1.0  K/uL   Eosinophils Relative 3 0 - 5 %   Eosinophils Absolute 0.4 0.0 - 0.7 K/uL   Basophils Relative 0 0 - 1 %   Basophils Absolute 0.0 0.0 - 0.1 K/uL  CK      Status: Abnormal   Collection Time: 11/30/14  4:15 AM  Result Value Ref Range   Total CK 23684 (H) 49 - 397 U/L     Assessment/Plan:   NEURO  Altered Mental Status:  sedation   Plan: CPM until decision made to wean for extubation.  PULM  Atelectasis/collapse (focal and left retrocardiac position)   Plan: CPM  CARDIO  Sinus Tachycardia   Plan: No specific treatment.  RENAL  Oliguria (suspect ATN) Actue Renal Failure (acute tubular necrosis and due to rhabdomyolysis)   Plan: Continue to support and await renal service decision about CRRT  GI  Vomiting and ileus   Plan: OGT to LIS.  If not able restart tube feedings tomorrow, will consider TPN  ID  No known infectious sources   Plan: CPM with prophylactic antibiotics  HEME  Anemia acute blood loss anemia, anemia of critical illness and anemia of renal disease) and thrombocytopenia   Plan: Stable and platelets are improving.  ENDO No known issues.   Plan: CPM  Global Issues  Will not try to extubate now that urine output is decreasing and creatinine is increasing.  He does not have a metabolic acidosis.  Will check ABG.  Await renal decision on CRRT.    LOS: 5 days   Additional comments:I reviewed the patient's new clinical lab test results. cbc/bmet and I reviewed the patients new imaging test results. cxr  Critical Care Total Time*: 30 Minutes  Korrie Hofbauer 11/30/2014  *Care during the described time interval was provided by me and/or other providers on the critical care team.  I have reviewed this patient's available data, including medical history, events of note, physical examination and test results as part of my evaluation.

## 2014-12-01 LAB — GLUCOSE, CAPILLARY
GLUCOSE-CAPILLARY: 99 mg/dL (ref 65–99)
GLUCOSE-CAPILLARY: 108 mg/dL — AB (ref 65–99)
Glucose-Capillary: 105 mg/dL — ABNORMAL HIGH (ref 65–99)
Glucose-Capillary: 107 mg/dL — ABNORMAL HIGH (ref 65–99)
Glucose-Capillary: 116 mg/dL — ABNORMAL HIGH (ref 65–99)
Glucose-Capillary: 92 mg/dL (ref 65–99)

## 2014-12-01 LAB — BLOOD GAS, ARTERIAL
ACID-BASE EXCESS: 10 mmol/L — AB (ref 0.0–2.0)
BICARBONATE: 35.6 meq/L — AB (ref 20.0–24.0)
Drawn by: 31394
FIO2: 0.3
O2 SAT: 97.3 %
PATIENT TEMPERATURE: 99.4
PEEP: 5 cmH2O
PH ART: 7.356 (ref 7.350–7.450)
PRESSURE SUPPORT: 10 cmH2O
TCO2: 37.6 mmol/L (ref 0–100)
pCO2 arterial: 65.7 mmHg (ref 35.0–45.0)
pO2, Arterial: 114 mmHg — ABNORMAL HIGH (ref 80.0–100.0)

## 2014-12-01 LAB — RENAL FUNCTION PANEL
ANION GAP: 15 (ref 5–15)
Albumin: 1.9 g/dL — ABNORMAL LOW (ref 3.5–5.0)
BUN: 73 mg/dL — ABNORMAL HIGH (ref 6–20)
CALCIUM: 7.2 mg/dL — AB (ref 8.9–10.3)
CHLORIDE: 88 mmol/L — AB (ref 101–111)
CO2: 36 mmol/L — AB (ref 22–32)
Creatinine, Ser: 13.31 mg/dL — ABNORMAL HIGH (ref 0.61–1.24)
GFR calc Af Amer: 5 mL/min — ABNORMAL LOW (ref >60)
GFR calc non Af Amer: 5 mL/min — ABNORMAL LOW (ref >60)
GLUCOSE: 108 mg/dL — AB (ref 65–99)
POTASSIUM: 4.6 mmol/L (ref 3.5–5.1)
Phosphorus: 10.4 mg/dL — ABNORMAL HIGH (ref 2.5–4.6)
SODIUM: 139 mmol/L (ref 135–145)

## 2014-12-01 LAB — CK: Total CK: 14750 U/L — ABNORMAL HIGH (ref 49–397)

## 2014-12-01 LAB — TRIGLYCERIDES: Triglycerides: 356 mg/dL — ABNORMAL HIGH (ref <150)

## 2014-12-01 LAB — CBC
HEMATOCRIT: 22.4 % — AB (ref 39.0–52.0)
HEMOGLOBIN: 7.3 g/dL — AB (ref 13.0–17.0)
MCH: 29.1 pg (ref 26.0–34.0)
MCHC: 32.6 g/dL (ref 30.0–36.0)
MCV: 89.2 fL (ref 78.0–100.0)
Platelets: 153 10*3/uL (ref 150–400)
RBC: 2.51 MIL/uL — ABNORMAL LOW (ref 4.22–5.81)
RDW: 14.3 % (ref 11.5–15.5)
WBC: 12.3 10*3/uL — ABNORMAL HIGH (ref 4.0–10.5)

## 2014-12-01 MED ORDER — ALTEPLASE 2 MG IJ SOLR
4.0000 mg | Freq: Once | INTRAMUSCULAR | Status: AC
  Administered 2014-12-01: 4 mg
  Filled 2014-12-01 (×2): qty 4

## 2014-12-01 MED ORDER — PIVOT 1.5 CAL PO LIQD
1000.0000 mL | ORAL | Status: DC
  Administered 2014-12-01 – 2014-12-08 (×6): 1000 mL
  Filled 2014-12-01 (×9): qty 1000
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
## 2014-12-01 NOTE — Progress Notes (Signed)
tPA removed from central line.  GBR from each port, flushed with 10cc NS in each port and a new cap was placed.  Caleb [name]
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
## 2014-12-02 ENCOUNTER — Encounter (HOSPITAL_COMMUNITY): Admission: EM | Disposition: A | Discharge status: Rehabilitation Facility | Payer: Self-pay | Source: Home / Self Care

## 2014-12-02 ENCOUNTER — Inpatient Hospital Stay (HOSPITAL_COMMUNITY): Payer: 59 | Admitting: Certified Registered"

## 2014-12-02 DIAGNOSIS — I9789 Other postprocedural complications and disorders of the circulatory system, not elsewhere classified: Secondary | ICD-10-CM

## 2014-12-02 HISTORY — PX: APPLICATION OF WOUND VAC: SHX5189

## 2014-12-02 HISTORY — PX: I & D EXTREMITY: SHX5045

## 2014-12-02 LAB — RENAL FUNCTION PANEL
ALBUMIN: 1.9 g/dL — AB (ref 3.5–5.0)
Anion gap: 15 (ref 5–15)
BUN: 98 mg/dL — AB (ref 6–20)
CALCIUM: 7.1 mg/dL — AB (ref 8.9–10.3)
CO2: 35 mmol/L — ABNORMAL HIGH (ref 22–32)
CREATININE: 15.31 mg/dL — AB (ref 0.61–1.24)
Chloride: 90 mmol/L — ABNORMAL LOW (ref 101–111)
GFR calc Af Amer: 4 mL/min — ABNORMAL LOW (ref >60)
GFR, EST NON AFRICAN AMERICAN: 4 mL/min — AB (ref >60)
GLUCOSE: 122 mg/dL — AB (ref 65–99)
PHOSPHORUS: 10.5 mg/dL — AB (ref 2.5–4.6)
POTASSIUM: 4.6 mmol/L (ref 3.5–5.1)
SODIUM: 140 mmol/L (ref 135–145)

## 2014-12-02 LAB — CBC WITH DIFFERENTIAL/PLATELET
BASOS ABS: 0 10*3/uL (ref 0.0–0.1)
BASOS PCT: 0 % (ref 0–1)
EOS ABS: 0.4 10*3/uL (ref 0.0–0.7)
EOS PCT: 3 % (ref 0–5)
HCT: 16.9 % — ABNORMAL LOW (ref 39.0–52.0)
Hemoglobin: 5.6 g/dL — CL (ref 13.0–17.0)
Lymphocytes Relative: 9 % — ABNORMAL LOW (ref 12–46)
Lymphs Abs: 1.2 10*3/uL (ref 0.7–4.0)
MCH: 29.3 pg (ref 26.0–34.0)
MCHC: 33.1 g/dL (ref 30.0–36.0)
MCV: 88.5 fL (ref 78.0–100.0)
MONO ABS: 1.4 10*3/uL — AB (ref 0.1–1.0)
Monocytes Relative: 10 % (ref 3–12)
Neutro Abs: 10.8 10*3/uL — ABNORMAL HIGH (ref 1.7–7.7)
Neutrophils Relative %: 78 % — ABNORMAL HIGH (ref 43–77)
PLATELETS: 263 10*3/uL (ref 150–400)
RBC: 1.91 MIL/uL — AB (ref 4.22–5.81)
RDW: 14.5 % (ref 11.5–15.5)
WBC: 13.8 10*3/uL — AB (ref 4.0–10.5)

## 2014-12-02 LAB — GLUCOSE, CAPILLARY
GLUCOSE-CAPILLARY: 108 mg/dL — AB (ref 65–99)
Glucose-Capillary: 117 mg/dL — ABNORMAL HIGH (ref 65–99)
Glucose-Capillary: 107 mg/dL — ABNORMAL HIGH (ref 65–99)
Glucose-Capillary: 112 mg/dL — ABNORMAL HIGH (ref 65–99)
Glucose-Capillary: 102 mg/dL — ABNORMAL HIGH (ref 65–99)

## 2014-12-02 LAB — CBC
HEMATOCRIT: 26.8 % — AB (ref 39.0–52.0)
Hemoglobin: 8.7 g/dL — ABNORMAL LOW (ref 13.0–17.0)
MCH: 28.7 pg (ref 26.0–34.0)
MCHC: 32.5 g/dL (ref 30.0–36.0)
MCV: 88.4 fL (ref 78.0–100.0)
PLATELETS: 267 10*3/uL (ref 150–400)
RBC: 3.03 MIL/uL — ABNORMAL LOW (ref 4.22–5.81)
RDW: 15.2 % (ref 11.5–15.5)
WBC: 16.3 10*3/uL — ABNORMAL HIGH (ref 4.0–10.5)

## 2014-12-02 LAB — BLOOD GAS, ARTERIAL
ACID-BASE EXCESS: 9.8 mmol/L — AB (ref 0.0–2.0)
Bicarbonate: 34.6 mEq/L — ABNORMAL HIGH (ref 20.0–24.0)
Drawn by: 406621
FIO2: 0.3
O2 SAT: 99 %
PCO2 ART: 54.4 mmHg — AB (ref 35.0–45.0)
PEEP/CPAP: 5 cmH2O
PH ART: 7.42 (ref 7.350–7.450)
PO2 ART: 129 mmHg — AB (ref 80.0–100.0)
PRESSURE SUPPORT: 10 cmH2O
Patient temperature: 98.6
TCO2: 36.3 mmol/L (ref 0–100)

## 2014-12-02 LAB — PREPARE RBC (CROSSMATCH)

## 2014-12-02 LAB — CK: CK TOTAL: 8260 U/L — AB (ref 49–397)

## 2014-12-02 SURGERY — IRRIGATION AND DEBRIDEMENT EXTREMITY
Anesthesia: General | Site: Leg Lower | Laterality: Left

## 2014-12-02 MED ORDER — MIDAZOLAM HCL 2 MG/2ML IJ SOLN
INTRAMUSCULAR | Status: AC
  Filled 2014-12-02: qty 4

## 2014-12-02 MED ORDER — ROCURONIUM BROMIDE 100 MG/10ML IV SOLN
INTRAVENOUS | Status: DC | PRN
  Administered 2014-12-02: 50 mg via INTRAVENOUS

## 2014-12-02 MED ORDER — FENTANYL CITRATE (PF) 250 MCG/5ML IJ SOLN
INTRAMUSCULAR | Status: AC
  Filled 2014-12-02: qty 5

## 2014-12-02 MED ORDER — PRO-STAT SUGAR FREE PO LIQD
60.0000 mL | Freq: Four times a day (QID) | ORAL | Status: DC
  Administered 2014-12-04 – 2014-12-08 (×18): 60 mL
  Filled 2014-12-02 (×28): qty 60

## 2014-12-02 MED ORDER — SODIUM CHLORIDE 0.9 % IV SOLN
INTRAVENOUS | Status: DC | PRN
  Administered 2014-12-02: 21:00:00 via INTRAVENOUS

## 2014-12-02 MED ORDER — NEOSTIGMINE METHYLSULFATE 10 MG/10ML IV SOLN
INTRAVENOUS | Status: AC
  Filled 2014-12-02: qty 1

## 2014-12-02 MED ORDER — MIDAZOLAM HCL 2 MG/2ML IJ SOLN
INTRAMUSCULAR | Status: AC
  Filled 2014-12-02: qty 2

## 2014-12-02 MED ORDER — 0.9 % SODIUM CHLORIDE (POUR BTL) OPTIME
TOPICAL | Status: DC | PRN
  Administered 2014-12-02: 1000 mL

## 2014-12-02 MED ORDER — SODIUM CHLORIDE 0.9 % IV SOLN
Freq: Once | INTRAVENOUS | Status: AC
  Administered 2014-12-04: 10 mL via INTRAVENOUS
  Administered 2014-12-04: 08:00:00 via INTRAVENOUS

## 2014-12-02 MED ORDER — MIDAZOLAM HCL 5 MG/5ML IJ SOLN
INTRAMUSCULAR | Status: DC | PRN
  Administered 2014-12-02 (×4): 2 mg via INTRAVENOUS

## 2014-12-02 MED ORDER — FENTANYL CITRATE (PF) 250 MCG/5ML IJ SOLN
INTRAMUSCULAR | Status: DC | PRN
  Administered 2014-12-02 (×6): 250 ug via INTRAVENOUS

## 2014-12-02 MED ORDER — ARTIFICIAL TEARS OP OINT
TOPICAL_OINTMENT | OPHTHALMIC | Status: AC
  Filled 2014-12-02: qty 3.5

## 2014-12-02 SURGICAL SUPPLY — 38 items
BAG ISL DRAPE 18X18 STRL (DRAPES) ×2
BAG ISOLATION DRAPE 18X18 (DRAPES) IMPLANT
BANDAGE ELASTIC 4 VELCRO ST LF (GAUZE/BANDAGES/DRESSINGS) IMPLANT
BANDAGE ELASTIC 6 VELCRO ST LF (GAUZE/BANDAGES/DRESSINGS) IMPLANT
BNDG GAUZE ELAST 4 BULKY (GAUZE/BANDAGES/DRESSINGS) ×2 IMPLANT
CANISTER SUCTION 2500CC (MISCELLANEOUS) ×3 IMPLANT
CONNECTOR Y ATS VAC SYSTEM (MISCELLANEOUS) ×2 IMPLANT
COVER SURGICAL LIGHT HANDLE (MISCELLANEOUS) ×6 IMPLANT
DRAPE INCISE IOBAN 66X45 STRL (DRAPES) IMPLANT
DRAPE ISOLATION BAG 18X18 (DRAPES) ×4
DRAPE ORTHO SPLIT 77X108 STRL (DRAPES)
DRAPE PROXIMA HALF (DRAPES) ×3 IMPLANT
DRAPE SURG ORHT 6 SPLT 77X108 (DRAPES) IMPLANT
DRSG PAD ABDOMINAL 8X10 ST (GAUZE/BANDAGES/DRESSINGS) ×2 IMPLANT
DRSG VAC ATS MED SENSATRAC (GAUZE/BANDAGES/DRESSINGS) ×4 IMPLANT
ELECT REM PT RETURN 9FT ADLT (ELECTROSURGICAL) ×3
ELECTRODE REM PT RTRN 9FT ADLT (ELECTROSURGICAL) ×1 IMPLANT
GAUZE SPONGE 4X4 12PLY STRL (GAUZE/BANDAGES/DRESSINGS) ×3 IMPLANT
GLOVE BIO SURGEON STRL SZ7 (GLOVE) ×3 IMPLANT
GLOVE BIOGEL PI IND STRL 7.5 (GLOVE) ×1 IMPLANT
GLOVE BIOGEL PI INDICATOR 7.5 (GLOVE) ×2
GOWN STRL REUS W/ TWL LRG LVL3 (GOWN DISPOSABLE) ×3 IMPLANT
GOWN STRL REUS W/TWL LRG LVL3 (GOWN DISPOSABLE) ×9
KIT BASIN OR (CUSTOM PROCEDURE TRAY) ×3 IMPLANT
KIT ROOM TURNOVER OR (KITS) ×3 IMPLANT
NS IRRIG 1000ML POUR BTL (IV SOLUTION) ×3 IMPLANT
PACK GENERAL/GYN (CUSTOM PROCEDURE TRAY) IMPLANT
PAD ARMBOARD 7.5X6 YLW CONV (MISCELLANEOUS) ×6 IMPLANT
PAD NEG PRESSURE SENSATRAC (MISCELLANEOUS) ×6 IMPLANT
SUT ETHILON 3 0 PS 1 (SUTURE) IMPLANT
SUT MNCRL AB 4-0 PS2 18 (SUTURE) IMPLANT
SUT SILK 3 0 (SUTURE) ×3
SUT SILK 3-0 18XBRD TIE 12 (SUTURE) IMPLANT
SUT VIC AB 2-0 CTX 36 (SUTURE) IMPLANT
SUT VIC AB 3-0 SH 27 (SUTURE)
SUT VIC AB 3-0 SH 27X BRD (SUTURE) IMPLANT
TAPE CLOTH SURG 6X10 WHT LF (GAUZE/BANDAGES/DRESSINGS) ×2 IMPLANT
WATER STERILE IRR 1000ML POUR (IV SOLUTION) ×3 IMPLANT

## 2014-12-02 NOTE — Anesthesia Postprocedure Evaluation (Signed)
  Anesthesia Post-op Note  Patient: Caleb [name]  Procedure(s) Performed: Procedure(s): FASCIOTOMY WASHOUT LEFT LOWER EXTREMITY; WITH debridment of dead muscle from anteior chamber left leg (Left) POSSIBLE APPLICATION OF WOUND VAC LEFT LOWER EXTREMITY (Left)  Patient Location: ICU  Anesthesia Type:General  Level of Consciousness: sedated and unresponsive  Airway and Oxygen Therapy: Patient remains intubated and on ventilator  Post-op Pain: none  Post-op Assessment: Post-op Vital signs reviewed, Patient's Cardiovascular Status Stable and Respiratory Function Stable  Post-op Vital Signs: Reviewed  Filed Vitals:   12/02/14 2100  BP:   Pulse: 105  Temp:   Resp: 14    Complications: No apparent anesthesia complications
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
## 2014-12-02 NOTE — Transfer of Care (Signed)
Immediate Anesthesia Transfer of Care Note  Patient: Caleb [name]  Procedure(s) Performed: Procedure(s): FASCIOTOMY WASHOUT LEFT LOWER EXTREMITY; WITH debridment of dead muscle from anteior chamber left leg (Left) POSSIBLE APPLICATION OF WOUND VAC LEFT LOWER EXTREMITY (Left)  Patient Location: SICU  Anesthesia Type:General  Level of Consciousness: sedated, unresponsive and Patient remains intubated per anesthesia plan  Airway & Oxygen Therapy: Patient remains intubated per anesthesia plan and Patient placed on Ventilator (see vital sign flow sheet for setting)  Post-op Assessment: Report given to RN and Post -op Vital signs reviewed and stable  Post vital signs: Reviewed and stable  Last Vitals:  Filed Vitals:   12/02/14 2100  BP:   Pulse: 105  Temp:   Resp: 14    Complications: No apparent anesthesia complications
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
## 2014-12-02 NOTE — Progress Notes (Signed)
Pt remains sedated and on ventilator; receiving blood transfusion this am for Hgb of 5.6.  Will return to OR later today for washout of fasciotomies, replacement of VAC, and possible excision of dead muscle.  Pt with large amount of edema and bleeding of affected leg.    Will continue to follow progress.    Reinaldo Raddle, RN, BSN  Trauma/Neuro ICU Case Manager [phone]
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
## 2014-12-02 NOTE — Anesthesia Preprocedure Evaluation (Addendum)
Anesthesia Evaluation  Patient identified by MRN, date of birth, ID band Patient unresponsive  General Assessment Comment:Intubated and sedated  Reviewed: Allergy & Precautions, H&P , NPO status , Patient's Chart, lab work & pertinent test results  Airway Mallampati: Intubated       Dental no notable dental hx.    Pulmonary  VDRF   Pulmonary exam normal breath sounds clear to auscultation       Cardiovascular negative cardio ROS   Rhythm:Regular Rate:Normal     Neuro/Psych negative neurological ROS  negative psych ROS   GI/Hepatic negative GI ROS, Neg liver ROS,   Endo/Other  negative endocrine ROS  Renal/GU negative Renal ROS  negative genitourinary   Musculoskeletal   Abdominal   Peds  Hematology negative hematology ROS (+)   Anesthesia Other Findings   Reproductive/Obstetrics negative OB ROS                            Anesthesia Physical Anesthesia Plan  ASA: IV  Anesthesia Plan: General   Post-op Pain Management:    Induction: Inhalational  Airway Management Planned:   Additional Equipment:   Intra-op Plan:   Post-operative Plan: Post-operative intubation/ventilation  Informed Consent: I have reviewed the patients History and Physical, chart, labs and discussed the procedure including the risks, benefits and alternatives for the proposed anesthesia with the patient or authorized representative who has indicated his/her understanding and acceptance.   Dental advisory given  Plan Discussed with: CRNA  Anesthesia Plan Comments:         Anesthesia Quick Evaluation

## 2014-12-02 NOTE — Clinical Social Work Note (Addendum)
CSW attempted to see patient to complete SBIRT and assessment, but patient is currently on a ventilator and sedated, CSW to complete assessment at a later time.  Caleb [name]. [place], MSW, [place] 12/02/2014 1:10 PM

## 2014-12-03 ENCOUNTER — Inpatient Hospital Stay (HOSPITAL_COMMUNITY): Payer: 59

## 2014-12-03 LAB — TYPE AND SCREEN
ABO/RH(D): A POS
ANTIBODY SCREEN: NEGATIVE
UNIT DIVISION: 0
Unit division: 0
Unit division: 0

## 2014-12-03 LAB — GLUCOSE, CAPILLARY
GLUCOSE-CAPILLARY: 116 mg/dL — AB (ref 65–99)
Glucose-Capillary: 102 mg/dL — ABNORMAL HIGH (ref 65–99)
Glucose-Capillary: 96 mg/dL (ref 65–99)
Glucose-Capillary: 110 mg/dL — ABNORMAL HIGH (ref 65–99)
Glucose-Capillary: 101 mg/dL — ABNORMAL HIGH (ref 65–99)
Glucose-Capillary: 109 mg/dL — ABNORMAL HIGH (ref 65–99)

## 2014-12-03 LAB — RENAL FUNCTION PANEL
Albumin: 2.1 g/dL — ABNORMAL LOW (ref 3.5–5.0)
Anion gap: 17 — ABNORMAL HIGH (ref 5–15)
BUN: 119 mg/dL — AB (ref 6–20)
CHLORIDE: 90 mmol/L — AB (ref 101–111)
CO2: 32 mmol/L (ref 22–32)
Calcium: 7.6 mg/dL — ABNORMAL LOW (ref 8.9–10.3)
Creatinine, Ser: 16.55 mg/dL — ABNORMAL HIGH (ref 0.61–1.24)
GFR, EST AFRICAN AMERICAN: 4 mL/min — AB (ref >60)
GFR, EST NON AFRICAN AMERICAN: 4 mL/min — AB (ref >60)
Glucose, Bld: 107 mg/dL — ABNORMAL HIGH (ref 65–99)
POTASSIUM: 4.3 mmol/L (ref 3.5–5.1)
Phosphorus: 10.7 mg/dL — ABNORMAL HIGH (ref 2.5–4.6)
Sodium: 139 mmol/L (ref 135–145)

## 2014-12-03 LAB — CBC WITH DIFFERENTIAL/PLATELET
Basophils Absolute: 0 10*3/uL (ref 0.0–0.1)
Basophils Relative: 0 % (ref 0–1)
EOS PCT: 3 % (ref 0–5)
Eosinophils Absolute: 0.5 10*3/uL (ref 0.0–0.7)
HEMATOCRIT: 25.5 % — AB (ref 39.0–52.0)
Hemoglobin: 8.7 g/dL — ABNORMAL LOW (ref 13.0–17.0)
Lymphocytes Relative: 7 % — ABNORMAL LOW (ref 12–46)
Lymphs Abs: 1.1 10*3/uL (ref 0.7–4.0)
MCH: 30 pg (ref 26.0–34.0)
MCHC: 34.1 g/dL (ref 30.0–36.0)
MCV: 87.9 fL (ref 78.0–100.0)
MONOS PCT: 6 % (ref 3–12)
Monocytes Absolute: 1 10*3/uL (ref 0.1–1.0)
NEUTROS PCT: 84 % — AB (ref 43–77)
Neutro Abs: 13.7 10*3/uL — ABNORMAL HIGH (ref 1.7–7.7)
Platelets: 303 10*3/uL (ref 150–400)
RBC: 2.9 MIL/uL — AB (ref 4.22–5.81)
RDW: 15.2 % (ref 11.5–15.5)
WBC: 16.3 10*3/uL — AB (ref 4.0–10.5)

## 2014-12-03 LAB — CK: Total CK: 6103 U/L — ABNORMAL HIGH (ref 49–397)

## 2014-12-03 NOTE — Progress Notes (Signed)
RT note- placed back to full support due to increased RR
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
## 2014-12-04 ENCOUNTER — Encounter (HOSPITAL_COMMUNITY): Admission: EM | Disposition: A | Discharge status: Rehabilitation Facility | Payer: Self-pay | Source: Home / Self Care

## 2014-12-04 ENCOUNTER — Inpatient Hospital Stay (HOSPITAL_COMMUNITY): Payer: 59

## 2014-12-04 ENCOUNTER — Inpatient Hospital Stay (HOSPITAL_COMMUNITY): Payer: 59 | Admitting: Anesthesiology

## 2014-12-04 DIAGNOSIS — N178 Other acute kidney failure: Secondary | ICD-10-CM

## 2014-12-04 HISTORY — PX: APPLICATION OF WOUND VAC: SHX5189

## 2014-12-04 HISTORY — PX: I & D EXTREMITY: SHX5045

## 2014-12-04 HISTORY — PX: INSERTION OF DIALYSIS CATHETER: SHX1324

## 2014-12-04 LAB — CBC WITH DIFFERENTIAL/PLATELET
BASOS ABS: 0 10*3/uL (ref 0.0–0.1)
Basophils Relative: 0 % (ref 0–1)
EOS PCT: 3 % (ref 0–5)
Eosinophils Absolute: 0.5 10*3/uL (ref 0.0–0.7)
HCT: 23.7 % — ABNORMAL LOW (ref 39.0–52.0)
HEMOGLOBIN: 7.9 g/dL — AB (ref 13.0–17.0)
LYMPHS ABS: 1 10*3/uL (ref 0.7–4.0)
Lymphocytes Relative: 6 % — ABNORMAL LOW (ref 12–46)
MCH: 29.9 pg (ref 26.0–34.0)
MCHC: 33.3 g/dL (ref 30.0–36.0)
MCV: 89.8 fL (ref 78.0–100.0)
MONO ABS: 1.2 10*3/uL — AB (ref 0.1–1.0)
Monocytes Relative: 7 % (ref 3–12)
Neutro Abs: 13.9 10*3/uL — ABNORMAL HIGH (ref 1.7–7.7)
Neutrophils Relative %: 84 % — ABNORMAL HIGH (ref 43–77)
Platelets: 352 10*3/uL (ref 150–400)
RBC: 2.64 MIL/uL — AB (ref 4.22–5.81)
RDW: 15.1 % (ref 11.5–15.5)
WBC: 16.6 10*3/uL — AB (ref 4.0–10.5)

## 2014-12-04 LAB — GLUCOSE, CAPILLARY
GLUCOSE-CAPILLARY: 113 mg/dL — AB (ref 65–99)
GLUCOSE-CAPILLARY: 104 mg/dL — AB (ref 65–99)
GLUCOSE-CAPILLARY: 110 mg/dL — AB (ref 65–99)
Glucose-Capillary: 103 mg/dL — ABNORMAL HIGH (ref 65–99)
Glucose-Capillary: 128 mg/dL — ABNORMAL HIGH (ref 65–99)

## 2014-12-04 LAB — RENAL FUNCTION PANEL
ALBUMIN: 2 g/dL — AB (ref 3.5–5.0)
Anion gap: 18 — ABNORMAL HIGH (ref 5–15)
BUN: 136 mg/dL — ABNORMAL HIGH (ref 6–20)
CALCIUM: 7.7 mg/dL — AB (ref 8.9–10.3)
CO2: 31 mmol/L (ref 22–32)
CREATININE: 18.49 mg/dL — AB (ref 0.61–1.24)
Chloride: 91 mmol/L — ABNORMAL LOW (ref 101–111)
GFR, EST AFRICAN AMERICAN: 4 mL/min — AB (ref >60)
GFR, EST NON AFRICAN AMERICAN: 3 mL/min — AB (ref >60)
Glucose, Bld: 108 mg/dL — ABNORMAL HIGH (ref 65–99)
PHOSPHORUS: 11.7 mg/dL — AB (ref 2.5–4.6)
Potassium: 4.8 mmol/L (ref 3.5–5.1)
SODIUM: 140 mmol/L (ref 135–145)

## 2014-12-04 LAB — CK: CK TOTAL: 3540 U/L — AB (ref 49–397)

## 2014-12-04 SURGERY — INSERTION OF DIALYSIS CATHETER
Anesthesia: General | Site: Neck | Laterality: Right

## 2014-12-04 MED ORDER — SODIUM CHLORIDE 0.9 % IJ SOLN
10.0000 mL | Freq: Two times a day (BID) | INTRAMUSCULAR | Status: DC
  Administered 2014-12-04 – 2014-12-13 (×16): 10 mL

## 2014-12-04 MED ORDER — HEPARIN SODIUM (PORCINE) 1000 UNIT/ML DIALYSIS: 1000.0000 [IU] | INTRAMUSCULAR | Status: DC | PRN

## 2014-12-04 MED ORDER — HEPARIN SODIUM (PORCINE) 5000 UNIT/ML IJ SOLN
5000.0000 [IU] | Freq: Three times a day (TID) | INTRAMUSCULAR | Status: DC
Start: 2014-12-04 — End: 2014-12-27
  Administered 2014-12-04 – 2014-12-27 (×68): 5000 [IU] via SUBCUTANEOUS
  Filled 2014-12-04 (×69): qty 1

## 2014-12-04 MED ORDER — ROCURONIUM BROMIDE 50 MG/5ML IV SOLN
INTRAVENOUS | Status: AC
  Filled 2014-12-04: qty 1

## 2014-12-04 MED ORDER — 0.9 % SODIUM CHLORIDE (POUR BTL) OPTIME
TOPICAL | Status: DC | PRN
  Administered 2014-12-04: 1000 mL

## 2014-12-04 MED ORDER — PENTAFLUOROPROP-TETRAFLUOROETH EX AERO: 1.0000 "application " | INHALATION_SPRAY | CUTANEOUS | Status: DC | PRN

## 2014-12-04 MED ORDER — HEPARIN SODIUM (PORCINE) 1000 UNIT/ML IJ SOLN
INTRAMUSCULAR | Status: AC
  Filled 2014-12-04: qty 1

## 2014-12-04 MED ORDER — SODIUM CHLORIDE 0.9 % IV SOLN: 100.0000 mL | INTRAVENOUS | Status: DC | PRN

## 2014-12-04 MED ORDER — HEPARIN SODIUM (PORCINE) 5000 UNIT/ML IJ SOLN: 5000.0000 [IU] | Freq: Three times a day (TID) | INTRAMUSCULAR | Status: DC

## 2014-12-04 MED ORDER — ALTEPLASE 2 MG IJ SOLR
2.0000 mg | Freq: Once | INTRAMUSCULAR | Status: DC | PRN
  Filled 2014-12-04: qty 2

## 2014-12-04 MED ORDER — CEFAZOLIN SODIUM-DEXTROSE 2-3 GM-% IV SOLR
INTRAVENOUS | Status: AC
  Filled 2014-12-04: qty 50

## 2014-12-04 MED ORDER — CEFAZOLIN SODIUM-DEXTROSE 2-3 GM-% IV SOLR
INTRAVENOUS | Status: DC | PRN
  Administered 2014-12-04: 2 g via INTRAVENOUS

## 2014-12-04 MED ORDER — LIDOCAINE HCL (PF) 1 % IJ SOLN
INTRAMUSCULAR | Status: AC
  Filled 2014-12-04: qty 30

## 2014-12-04 MED ORDER — FENTANYL 75 MCG/HR TD PT72
100.0000 ug | MEDICATED_PATCH | TRANSDERMAL | Status: DC
  Administered 2014-12-04 – 2014-12-10 (×3): 100 ug via TRANSDERMAL
  Filled 2014-12-04 (×6): qty 1

## 2014-12-04 MED ORDER — PHENYLEPHRINE 40 MCG/ML (10ML) SYRINGE FOR IV PUSH (FOR BLOOD PRESSURE SUPPORT)
PREFILLED_SYRINGE | INTRAVENOUS | Status: AC
  Filled 2014-12-04: qty 10

## 2014-12-04 MED ORDER — DEXMEDETOMIDINE HCL IN NACL 400 MCG/100ML IV SOLN
0.4000 ug/kg/h | INTRAVENOUS | Status: DC
Start: 2014-12-04 — End: 2014-12-12
  Administered 2014-12-04: 0.5 ug/kg/h via INTRAVENOUS
  Administered 2014-12-05 (×3): 1.2 ug/kg/h via INTRAVENOUS
  Administered 2014-12-05: 1.1 ug/kg/h via INTRAVENOUS
  Administered 2014-12-05: 0.5 ug/kg/h via INTRAVENOUS
  Administered 2014-12-05 – 2014-12-08 (×20): 1.2 ug/kg/h via INTRAVENOUS
  Administered 2014-12-08: 0.7 ug/kg/h via INTRAVENOUS
  Administered 2014-12-08 – 2014-12-09 (×3): 1.2 ug/kg/h via INTRAVENOUS
  Administered 2014-12-09: 0.6 ug/kg/h via INTRAVENOUS
  Administered 2014-12-09: 0.5 ug/kg/h via INTRAVENOUS
  Administered 2014-12-10 – 2014-12-12 (×6): 0.4 ug/kg/h via INTRAVENOUS
  Filled 2014-12-04 (×37): qty 100

## 2014-12-04 MED ORDER — HEPARIN SODIUM (PORCINE) 1000 UNIT/ML IJ SOLN
INTRAMUSCULAR | Status: DC | PRN
  Administered 2014-12-04: 4.6 mL via INTRAVENOUS

## 2014-12-04 MED ORDER — LIDOCAINE-PRILOCAINE 2.5-2.5 % EX CREA
1.0000 "application " | TOPICAL_CREAM | CUTANEOUS | Status: DC | PRN
  Filled 2014-12-04: qty 5

## 2014-12-04 MED ORDER — MIDAZOLAM BOLUS VIA INFUSION
INTRAVENOUS | Status: DC | PRN
  Administered 2014-12-04: 2 mg via INTRAVENOUS

## 2014-12-04 MED ORDER — SODIUM CHLORIDE 0.9 % IR SOLN
Status: DC | PRN
  Administered 2014-12-04: 500 mL

## 2014-12-04 MED ORDER — LIDOCAINE-EPINEPHRINE (PF) 1 %-1:200000 IJ SOLN
INTRAMUSCULAR | Status: DC | PRN
  Administered 2014-12-04: 11 mL

## 2014-12-04 MED ORDER — LIDOCAINE HCL (PF) 1 % IJ SOLN: 5.0000 mL | INTRAMUSCULAR | Status: DC | PRN

## 2014-12-04 MED ORDER — DEXMEDETOMIDINE HCL IN NACL 200 MCG/50ML IV SOLN
0.4000 ug/kg/h | INTRAVENOUS | Status: DC
  Administered 2014-12-04: 0.4 ug/kg/h via INTRAVENOUS
  Filled 2014-12-04 (×2): qty 50

## 2014-12-04 MED ORDER — SODIUM CHLORIDE 0.9 % IJ SOLN
10.0000 mL | INTRAMUSCULAR | Status: DC | PRN
  Administered 2014-12-10: 10 mL
  Filled 2014-12-04: qty 40

## 2014-12-04 SURGICAL SUPPLY — 66 items
BAG DECANTER FOR FLEXI CONT (MISCELLANEOUS) ×4 IMPLANT
BANDAGE ELASTIC 4 VELCRO ST LF (GAUZE/BANDAGES/DRESSINGS) IMPLANT
BANDAGE ELASTIC 6 VELCRO ST LF (GAUZE/BANDAGES/DRESSINGS) IMPLANT
BIOPATCH RED 1 DISK 7.0 (GAUZE/BANDAGES/DRESSINGS) ×3 IMPLANT
BIOPATCH RED 1IN DISK 7.0MM (GAUZE/BANDAGES/DRESSINGS) ×1
BNDG GAUZE ELAST 4 BULKY (GAUZE/BANDAGES/DRESSINGS) IMPLANT
CANISTER SUCTION 2500CC (MISCELLANEOUS) ×4 IMPLANT
CATH CANNON HEMO 15F 50CM (CATHETERS) IMPLANT
CATH CANNON HEMO 15FR 19 (HEMODIALYSIS SUPPLIES) IMPLANT
CATH CANNON HEMO 15FR 23CM (HEMODIALYSIS SUPPLIES) ×2 IMPLANT
CATH CANNON HEMO 15FR 31CM (HEMODIALYSIS SUPPLIES) IMPLANT
CATH CANNON HEMO 15FR 32 (HEMODIALYSIS SUPPLIES) IMPLANT
CATH CANNON HEMO 15FR 32CM (HEMODIALYSIS SUPPLIES) IMPLANT
CATH STRAIGHT 5FR 65CM (CATHETERS) IMPLANT
CONNECTOR Y ATS VAC SYSTEM (MISCELLANEOUS) ×6 IMPLANT
COVER PROBE W GEL 5X96 (DRAPES) ×4 IMPLANT
COVER SURGICAL LIGHT HANDLE (MISCELLANEOUS) ×8 IMPLANT
COVER TRANSDUCER ULTRASND GEL (DRAPE) ×2 IMPLANT
DRAPE C-ARM 42X72 X-RAY (DRAPES) ×4 IMPLANT
DRAPE CHEST BREAST 15X10 FENES (DRAPES) ×4 IMPLANT
DRAPE INCISE IOBAN 66X45 STRL (DRAPES) ×2 IMPLANT
DRAPE ORTHO SPLIT 77X108 STRL (DRAPES) ×4
DRAPE PROXIMA HALF (DRAPES) ×4 IMPLANT
DRAPE SURG ORHT 6 SPLT 77X108 (DRAPES) IMPLANT
DRSG VAC ATS MED SENSATRAC (GAUZE/BANDAGES/DRESSINGS) ×4 IMPLANT
ELECT REM PT RETURN 9FT ADLT (ELECTROSURGICAL) ×4
ELECTRODE REM PT RTRN 9FT ADLT (ELECTROSURGICAL) ×2 IMPLANT
GAUZE SPONGE 2X2 8PLY STRL LF (GAUZE/BANDAGES/DRESSINGS) ×2 IMPLANT
GAUZE SPONGE 4X4 12PLY STRL (GAUZE/BANDAGES/DRESSINGS) ×4 IMPLANT
GAUZE SPONGE 4X4 16PLY XRAY LF (GAUZE/BANDAGES/DRESSINGS) ×4 IMPLANT
GLOVE BIO SURGEON STRL SZ7 (GLOVE) ×4 IMPLANT
GLOVE BIOGEL PI IND STRL 7.5 (GLOVE) ×2 IMPLANT
GLOVE BIOGEL PI INDICATOR 7.5 (GLOVE) ×2
GOWN STRL REUS W/ TWL LRG LVL3 (GOWN DISPOSABLE) ×6 IMPLANT
GOWN STRL REUS W/TWL LRG LVL3 (GOWN DISPOSABLE) ×12
KIT BASIN OR (CUSTOM PROCEDURE TRAY) ×4 IMPLANT
KIT ROOM TURNOVER OR (KITS) ×4 IMPLANT
LIQUID BAND (GAUZE/BANDAGES/DRESSINGS) IMPLANT
NDL 18GX1X1/2 (RX/OR ONLY) (NEEDLE) ×2 IMPLANT
NDL HYPO 25GX1X1/2 BEV (NEEDLE) ×2 IMPLANT
NEEDLE 18GX1X1/2 (RX/OR ONLY) (NEEDLE) ×4 IMPLANT
NEEDLE HYPO 25GX1X1/2 BEV (NEEDLE) ×4 IMPLANT
NS IRRIG 1000ML POUR BTL (IV SOLUTION) ×4 IMPLANT
PACK GENERAL/GYN (CUSTOM PROCEDURE TRAY) IMPLANT
PACK SURGICAL SETUP 50X90 (CUSTOM PROCEDURE TRAY) ×4 IMPLANT
PAD ARMBOARD 7.5X6 YLW CONV (MISCELLANEOUS) ×8 IMPLANT
PAD NEG PRESSURE SENSATRAC (MISCELLANEOUS) ×4 IMPLANT
SET MICROPUNCTURE 5F STIFF (MISCELLANEOUS) IMPLANT
SOAP 2 % CHG 4 OZ (WOUND CARE) ×4 IMPLANT
SPONGE GAUZE 2X2 STER 10/PKG (GAUZE/BANDAGES/DRESSINGS) ×2
SUT ETHILON 3 0 PS 1 (SUTURE) ×4 IMPLANT
SUT MNCRL AB 4-0 PS2 18 (SUTURE) ×4 IMPLANT
SUT SILK 3 0 (SUTURE) ×4
SUT SILK 3-0 18XBRD TIE 12 (SUTURE) IMPLANT
SUT VIC AB 2-0 CTX 36 (SUTURE) IMPLANT
SUT VIC AB 3-0 SH 27 (SUTURE)
SUT VIC AB 3-0 SH 27X BRD (SUTURE) IMPLANT
SYR 20CC LL (SYRINGE) ×8 IMPLANT
SYR 3ML LL SCALE MARK (SYRINGE) ×4 IMPLANT
SYR 5ML LL (SYRINGE) ×4 IMPLANT
SYR CONTROL 10ML LL (SYRINGE) ×4 IMPLANT
SYRINGE 10CC LL (SYRINGE) ×4 IMPLANT
TAPE CLOTH SURG 4X10 WHT LF (GAUZE/BANDAGES/DRESSINGS) ×2 IMPLANT
TUBING VACUUM 7/8 X6' (MISCELLANEOUS) ×4 IMPLANT
WATER STERILE IRR 1000ML POUR (IV SOLUTION) ×4 IMPLANT
WIRE AMPLATZ SS-J .035X180CM (WIRE) IMPLANT
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
## 2014-12-04 NOTE — Progress Notes (Signed)
RT note-patient taken to the OR by anesthesia for surgery.

## 2014-12-04 NOTE — Anesthesia Preprocedure Evaluation (Signed)
Anesthesia Evaluation  Patient identified by MRN, date of birth, ID band Patient unresponsive  General Assessment Comment:Intubated and sedated  Reviewed: Allergy & Precautions, H&P , NPO status , Patient's Chart, lab work & pertinent test results  Airway Mallampati: Intubated       Dental no notable dental hx. (+) Dental Advisory Given   Pulmonary  VDRF   Pulmonary exam normal breath sounds clear to auscultation       Cardiovascular negative cardio ROS   Rhythm:Regular Rate:Normal     Neuro/Psych negative neurological ROS  negative psych ROS   GI/Hepatic negative GI ROS, Neg liver ROS,   Endo/Other  negative endocrine ROS  Renal/GU ARFRenal disease  negative genitourinary   Musculoskeletal   Abdominal   Peds  Hematology negative hematology ROS (+)   Anesthesia Other Findings   Reproductive/Obstetrics negative OB ROS                             Anesthesia Physical Anesthesia Plan  ASA: IV  Anesthesia Plan: General   Post-op Pain Management:    Induction: Inhalational  Airway Management Planned:   Additional Equipment:   Intra-op Plan:   Post-operative Plan: Post-operative intubation/ventilation  Informed Consent: I have reviewed the patients History and Physical, chart, labs and discussed the procedure including the risks, benefits and alternatives for the proposed anesthesia with the patient or authorized representative who has indicated his/her understanding and acceptance.   Dental advisory given  Plan Discussed with: CRNA  Anesthesia Plan Comments:         Anesthesia Quick Evaluation

## 2014-12-04 NOTE — Op Note (Addendum)
OPERATIVE NOTE  PROCEDURE: 1. Right internal jugular vein tunneled dialysis catheter placement 2. Right internal jugular vein cannulation under ultrasound guidance 3. Excision of dead muscle lateral compartment 4. Negative pressure dressing change x 4  PRE-OPERATIVE DIAGNOSIS: end-stage renal failure  POST-OPERATIVE DIAGNOSIS: same as above  SURGEON: Adele Barthel, MD  ANESTHESIA: general  ESTIMATED BLOOD LOSS: 30 cc  FINDING(S): 1.  Tips of the catheter in the right atrium on fluoroscopy 2.  No obvious pneumothorax on fluoroscopy 3.  Dead muscle in lateral compartment  SPECIMEN(S):  none  INDICATIONS:   Caleb [name] is a 24 y.o. male who presents with end stage renal disease.  The patient presents for tunneled dialysis catheter placement.  The patient is aware the risks of tunneled dialysis catheter placement include but are not limited to: bleeding, infection, central venous injury, pneumothorax, possible venous stenosis, possible malpositioning in the venous system, and possible infections related to long-term catheter presence.  The patient was aware of these risks and agreed to proceed.  DESCRIPTION: After written full informed consent was obtained from the patient, the patient was taken back to the operating room.  Prior to induction, the patient was given IV antibiotics.  After obtaining adequate sedation, the patient was prepped and draped in the standard fashion for a chest or neck tunneled dialysis catheter placement.   The cannulation site, the catheter exit site, and tract for the subcutaneous tunnel were then anesthestized with a total of 20 cc of a 1:1 mixture of 0.5% Marcaine without epinepherine and 1% Lidocaine with epinepherine.  Under ultrasound guidance, the right internal jugular vein was cannulated with the 18 gauge needle.  A J-wire was then placed down into the right ventricle under fluoroscopic guidance.  The wire was then secured in place with a clamp to  the drapes.  I then made stab incisions at the neck and exit sites.   I dissected from the exit site to the cannulation site with a metal tunneler.   The subcutaneous tunnel was dilated by passing a plastic dilator over the metal dissector. The wire was then unclamped and I removed the needle.  The skin tract and venotomy was dilated serially with dilators.  Finally, the dilator-sheath was placed under fluoroscopic guidance into the superior vena cava.  The dilator and wire were removed.  A 23 cm Diatek catheter was placed under fluoroscopic guidance down into the right atrium.  The sheath was broken and peeled away while holding the catheter cuff at the level of the skin.  The back end of this catheter was transected, revealing the two lumens of this catheter.  The ports were docked onto these two lumens.  The catheter hub was then screwed into place.  Each port was tested by aspirating and flushing.  No resistance was noted.  Each port was then thoroughly flushed with heparinized saline.  The catheter was secured in placed with two interrupted stitches of 3-0 Nylon tied to the catheter.  The neck incision was closed with a U-stitch of 4-0 Monocryl.  The neck and chest incision were cleaned and sterile bandages applied.  Each port was then loaded with concentrated heparin (1000 Units/mL) at the manufacturer recommended volumes to each port.  Sterile caps were applied to each port.  On completion fluoroscopy, the tips of the catheter were in the right atrium, and there was no evidence of pneumothorax.  At this point, the drapes were taken down.  The left leg was prepped circumferentially  and sterilely drapes.  I turned my attention to the lateral compartment.  The remaining lateral compartment muscle appeared to be dead with no contraction with electrical stimulation.  Some the fat in the muscle was starting to saponify.  I excised the dead muscle with electrocautery.  I washed out this lateral fasciotomy wound  and fashioned a large VAC sponge for this wound.  This sponge was affixed with adhesive strips.  I cut a hole in the center of the strips and attached the [place].  This was connected to the VAC pump at 125 continuous.  A few areas needed reinforcement with additional adhesive strips.  At this point, I check the muscle in the remaining fasciotomy sites.  All remaining muscle appeared alive.  I fashioned VAC sponges for all of these incisions.  Each sponge was affixed with adhesive strips.  A hole was cut into the adhesive strip in the center of each incision.  A lilypad was attached to each VAC sponge.  The medial fasciotomy incision were attached to a Y-connector and connected to the VAC pump at 125 continuous.  The lateral VAC sponge was connected to the VAC pump at 125 continuous.  Leaking areas were reinforced with additional adhesive strips.  All VAC sponges were adherent and running with leaks.   COMPLICATIONS: none  CONDITION: stable   Adele Barthel, MD [facility] Office: [phone] Pager: [phone]  12/04/2014, 8:38 AM

## 2014-12-04 NOTE — Interval H&P Note (Signed)
[facility]  History and Physical Update  The patient was interviewed and re-examined.  The patient's previous History and Physical has been reviewed and is unchanged from my prior note except interval anterior compartment muscle excision and progression of ARF.  The plan is: repeat muscle debridement of lateral compartment, VAC replacement, and TDC placement.  The patient is aware the risks of tunneled dialysis catheter placement include but are not limited to: bleeding, infection, central venous injury, pneumothorax, possible venous stenosis, possible malpositioning in the venous system, and possible infections related to long-term catheter presence. The patient was aware of these risks and agreed to proceed.   Adele Barthel, MD [facility] Office: [phone] Pager: [phone]  12/04/2014, 7:27 AM

## 2014-12-04 NOTE — Progress Notes (Signed)
Follow up - Trauma and Critical Care  Patient Details:    Caleb [name] is an 24 y.o. male.  Lines/tubes : Airway 7.5 mm (Active)  Secured at (cm) 24 cm 12/04/2014 10:26 AM  Measured From Lips 12/04/2014 10:26 AM  Secured Location Right 12/04/2014 10:26 AM  Secured By [employer] 12/04/2014 10:26 AM  Tube Holder Repositioned Yes 12/04/2014 10:26 AM  Cuff Pressure (cm H2O) 24 cm H2O 12/02/2014 11:56 PM  Site Condition Dry 12/04/2014 10:26 AM     CVC Triple Lumen 11/25/14 Left Subclavian (Active)  Indication for Insertion or Continuance of Line Limited venous access - need for IV therapy >5 days (PICC only) 12/03/2014  9:00 PM  Site Assessment Clean;Dry;Intact 12/03/2014  9:00 PM  Proximal Lumen Status Infusing 12/03/2014  9:00 PM  Medial Infusing 12/03/2014  9:00 PM  Distal Lumen Status Infusing 12/03/2014  9:00 PM  Dressing Type Transparent;Gauze 12/03/2014  9:00 PM  Dressing Status Clean;Dry;Intact;Antimicrobial disc in place 12/03/2014  9:00 PM  Line Care Connections checked and tightened 12/03/2014  9:00 PM  Dressing Intervention Dressing changed;Antimicrobial disc changed 12/01/2014  4:00 AM  Dressing Change Due 12/08/14 12/03/2014  9:00 PM     Arterial Line 11/25/14 Left Radial (Active)  Site Assessment Clean;Dry;Intact 12/03/2014  9:00 PM  Line Status Pulsatile blood flow 12/03/2014  9:00 PM  Art Line Waveform Appropriate 12/03/2014  9:00 PM  Art Line Interventions Zeroed and calibrated;Leveled;Connections checked and tightened;Flushed per protocol 12/03/2014  9:00 PM  Color/Movement/Sensation Capillary refill less than 3 sec 12/03/2014  9:00 PM  Dressing Type Transparent 12/03/2014  9:00 PM  Dressing Status Clean;Dry;Intact 12/03/2014  9:00 PM  Interventions Dressing changed 12/01/2014 10:00 AM  Dressing Change Due 12/08/14 12/03/2014  9:00 PM     Negative Pressure Wound Therapy Thigh Upper;Lateral;Left (Active)  Last dressing change 12/02/14 12/02/2014 12:00 AM  Site / Wound  Assessment Bleeding 12/02/2014 12:00 AM  Peri-wound Assessment Bleeding 12/02/2014 12:00 AM  Size 1 11/30/2014  9:00 PM  Wound filler - Black foam 1 12/02/2014 12:00 AM  Wound filler - Nonadherent 1 12/02/2014 12:00 AM  Cycle Continuous 12/02/2014 12:00 AM  Target Pressure (mmHg) 125 12/02/2014 12:00 AM  Canister Changed No 12/02/2014 12:00 AM  Dressing Status Leaking 12/02/2014 12:00 AM  Drainage Amount Moderate 12/02/2014 12:00 AM  Drainage Description Sanguineous 12/02/2014 12:00 AM  Output (mL) 175 mL 12/04/2014  6:00 AM     Negative Pressure Wound Therapy Leg Left;Lower;Lateral (Active)  Last dressing change 12/01/14 12/02/2014 12:00 AM  Site / Wound Assessment Bleeding 12/02/2014 12:00 AM  Peri-wound Assessment Bleeding 12/02/2014 12:00 AM  Wound filler - Black foam 1 12/02/2014 12:00 AM  Wound filler - Nonadherent 1 12/02/2014 12:00 AM  Wound filler - Gauze 1 12/01/2014  2:40 AM  Cycle Continuous 12/02/2014 12:00 AM  Target Pressure (mmHg) 125 12/02/2014 12:00 AM  Canister Changed No 12/02/2014 12:00 AM  Dressing Status Intact 12/02/2014 12:00 AM  Drainage Amount Moderate 12/02/2014 12:00 AM  Drainage Description Serosanguineous 12/02/2014 12:00 AM  Output (mL) 100 mL 12/04/2014  6:00 AM     Negative Pressure Wound Therapy Thigh Upper;Medial;Left (Active)  Last dressing change 12/02/14 12/02/2014 12:00 AM  Site / Wound Assessment Bleeding 12/02/2014 12:00 AM  Peri-wound Assessment Bleeding 12/02/2014 12:00 AM  Size small 12/02/2014 12:00 AM  Wound filler - Black foam 1 12/02/2014 12:00 AM  Wound filler - Nonadherent 1 12/02/2014 12:00 AM  Cycle Continuous;On 12/02/2014 12:00 AM  Target Pressure (mmHg) 125  12/02/2014 12:00 AM  Canister Changed No 12/02/2014 12:00 AM  Dressing Status Intact 12/02/2014 12:00 AM  Drainage Amount Minimal 12/02/2014 12:00 AM  Drainage Description Serosanguineous 12/02/2014 12:00 AM  Output (mL) 150 mL 12/03/2014 10:00 PM     Negative Pressure Wound Therapy Leg Left;Lower;Medial (Active)  Last dressing change  11/30/14 12/02/2014 12:00 AM  Site / Wound Assessment Bleeding 12/02/2014 12:00 AM  Peri-wound Assessment Bleeding 12/02/2014 12:00 AM  Size medium 12/02/2014 12:00 AM  Wound filler - Black foam 1 12/02/2014 12:00 AM  Wound filler - Nonadherent 1 12/02/2014 12:00 AM  Cycle Continuous 12/02/2014 12:00 AM  Target Pressure (mmHg) 125 12/02/2014 12:00 AM  Canister Changed No 12/02/2014 12:00 AM  Dressing Status Intact 12/02/2014 12:00 AM  Drainage Amount Moderate 12/02/2014 12:00 AM  Drainage Description Serosanguineous 12/02/2014 12:00 AM  Output (mL) 200 mL 12/02/2014  4:00 PM     NG/OG Tube Orogastric 14 Fr. Right mouth (Active)  Placement Verification Auscultation 12/03/2014  8:00 PM  Site Assessment Clean;Dry;Intact 12/03/2014  8:00 PM  Status Clamped 12/03/2014  8:00 PM  Drainage Appearance Yellow 12/01/2014  4:00 PM  Gastric Residual 100 mL 12/01/2014  8:00 PM  Intake (mL) 30 mL 12/04/2014  4:00 AM  Output (mL) 50 mL 12/04/2014  6:00 AM     Urethral Catheter C.Simmons,RN Latex;Straight-tip 16 Fr. (Active)  Indication for Insertion or Continuance of Catheter Unstable critical patients (first 24-48 hours) 12/04/2014  8:00 AM  Site Assessment Clean;Intact 12/03/2014  8:00 PM  Catheter Maintenance Bag below level of bladder;No dependent loops;Seal intact;Bag emptied prior to transport;Catheter secured;Drainage bag/tubing not touching floor;Insertion date on drainage bag 12/04/2014  8:00 AM  Collection Container Standard drainage bag 12/03/2014  8:00 PM  Securement Method Leg strap 12/03/2014  8:00 PM  Urinary Catheter Interventions Unclamped 12/03/2014  8:00 PM  Output (mL) 20 mL 12/04/2014  6:00 AM    Microbiology/Sepsis markers: Results for orders placed or performed during the hospital encounter of 11/25/14  MRSA PCR Screening     Status: None   Collection Time: 11/25/14 10:00 AM  Result Value Ref Range Status   MRSA by PCR NEGATIVE NEGATIVE Final    Comment:        The GeneXpert MRSA Assay (FDA approved for NASAL  specimens only), is one component of a comprehensive MRSA colonization surveillance program. It is not intended to diagnose MRSA infection nor to guide or monitor treatment for MRSA infections.     Anti-infectives:  Anti-infectives    None      Best Practice/Protocols:  VTE Prophylaxis: Lovenox (prophylaxtic dose) and Mechanical GI Prophylaxis: Proton Pump Inhibitor Continous Sedation High doses of fentanyl and Versed  Consults: Treatment Team:  Conrad [place], MD    Events:  Subjective:    Overnight Issues: Went to surgery this AM.  Most of his compartments by report are necrotic  Objective:  Vital signs for last 24 hours: Temp:  [99 F (37.2 C)-100.4 F (38 C)] 99 F (37.2 C) (09/11 1116) Pulse Rate:  [92-130] 102 (09/11 0700) Resp:  [11-21] 12 (09/11 0700) BP: (113-155)/(54-85) 125/54 mmHg (09/11 0500) SpO2:  [95 %-100 %] 100 % (09/11 0700) Arterial Line BP: (97-189)/(53-81) 146/65 mmHg (09/11 0700) FiO2 (%):  [30 %] 30 % (09/11 1026) Weight:  [102.9 kg (226 lb 13.7 oz)] 102.9 kg (226 lb 13.7 oz) (09/11 0600)  Hemodynamic parameters for last 24 hours:    Intake/Output from previous day: 09/10 0701 - 09/11 0700 In: 1244 [I.V.:896;  NG/GT:90; IV Piggyback:258] Out: 1870 [Urine:445; Emesis/NG output:250; Drains:1175]  Intake/Output this shift: Total I/O In: 20 [I.V.:20] Out: 30 [Blood:30]  Vent settings for last 24 hours: Vent Mode:  [-] PRVC FiO2 (%):  [30 %] 30 % Set Rate:  [12 bmp] 12 bmp Vt Set:  [600 mL] 600 mL PEEP:  [5 cmH20] 5 cmH20 Pressure Support:  [10 cmH20] 10 cmH20 Plateau Pressure:  [18 cmH20] 18 cmH20  Physical Exam:  General: alert and no respiratory distress Neuro: alert, oriented and weakness left lower extremity Resp: clear to auscultation bilaterally CVS: Sinus tachycardia Extremities: edema 4+, pulses doppler,  and unequal size  Results for orders placed or performed during the hospital encounter of 11/25/14 (from the  past 24 hour(s))  Glucose, capillary     Status: Abnormal   Collection Time: 12/03/14 12:51 PM  Result Value Ref Range   Glucose-Capillary 101 (H) 65 - 99 mg/dL  Glucose, capillary     Status: Abnormal   Collection Time: 12/03/14  4:02 PM  Result Value Ref Range   Glucose-Capillary 109 (H) 65 - 99 mg/dL   Comment 1 Notify RN   Glucose, capillary     Status: Abnormal   Collection Time: 12/03/14  8:07 PM  Result Value Ref Range   Glucose-Capillary 116 (H) 65 - 99 mg/dL   Comment 1 Notify RN    Comment 2 Document in Chart   Renal function panel     Status: Abnormal   Collection Time: 12/04/14  3:57 AM  Result Value Ref Range   Sodium 140 135 - 145 mmol/L   Potassium 4.8 3.5 - 5.1 mmol/L   Chloride 91 (L) 101 - 111 mmol/L   CO2 31 22 - 32 mmol/L   Glucose, Bld 108 (H) 65 - 99 mg/dL   BUN 136 (H) 6 - 20 mg/dL   Creatinine, Ser 18.49 (H) 0.61 - 1.24 mg/dL   Calcium 7.7 (L) 8.9 - 10.3 mg/dL   Phosphorus 11.7 (H) 2.5 - 4.6 mg/dL   Albumin 2.0 (L) 3.5 - 5.0 g/dL   GFR calc non Af Amer 3 (L) >60 mL/min   GFR calc Af Amer 4 (L) >60 mL/min   Anion gap 18 (H) 5 - 15  CK     Status: Abnormal   Collection Time: 12/04/14  3:57 AM  Result Value Ref Range   Total CK 3540 (H) 49 - 397 U/L  CBC with Differential/Platelet     Status: Abnormal   Collection Time: 12/04/14  3:57 AM  Result Value Ref Range   WBC 16.6 (H) 4.0 - 10.5 K/uL   RBC 2.64 (L) 4.22 - 5.81 MIL/uL   Hemoglobin 7.9 (L) 13.0 - 17.0 g/dL   HCT 23.7 (L) 39.0 - 52.0 %   MCV 89.8 78.0 - 100.0 fL   MCH 29.9 26.0 - 34.0 pg   MCHC 33.3 30.0 - 36.0 g/dL   RDW 15.1 11.5 - 15.5 %   Platelets 352 150 - 400 K/uL   Neutrophils Relative % 84 (H) 43 - 77 %   Lymphocytes Relative 6 (L) 12 - 46 %   Monocytes Relative 7 3 - 12 %   Eosinophils Relative 3 0 - 5 %   Basophils Relative 0 0 - 1 %   Neutro Abs 13.9 (H) 1.7 - 7.7 K/uL   Lymphs Abs 1.0 0.7 - 4.0 K/uL   Monocytes Absolute 1.2 (H) 0.1 - 1.0 K/uL   Eosinophils Absolute 0.5 0.0  - 0.7  K/uL   Basophils Absolute 0.0 0.0 - 0.1 K/uL   RBC Morphology POLYCHROMASIA PRESENT    WBC Morphology MILD LEFT SHIFT (1-5% METAS, OCC MYELO, OCC BANDS)    Smear Review LARGE PLATELETS PRESENT   Glucose, capillary     Status: Abnormal   Collection Time: 12/04/14  4:44 AM  Result Value Ref Range   Glucose-Capillary 113 (H) 65 - 99 mg/dL   Comment 1 Notify RN    Comment 2 Document in Chart   Glucose, capillary     Status: Abnormal   Collection Time: 12/04/14  7:24 AM  Result Value Ref Range   Glucose-Capillary 103 (H) 65 - 99 mg/dL   Comment 1 Capillary Specimen    Comment 2 Notify RN      Assessment/Plan:   NEURO  Altered Mental Status:  sedation   Plan: Keep sedated until final decision made about what to do with the leg.  PULM  Doing very well on the ventilator   Plan: Could wean to extubate, but will not do so yet.  CARDIO  Sinus Tachycardia   Plan: No specific treatment  RENAL  Oliguria (suspect ATN) Actue Renal Failure (due to rhabdomyolysis)   Plan: Will start dialysis soon.  GI  No specific treatment   Plan: CPM, will restart tube feedings  ID  No known infectious sorces.     Plan: CPM  HEME  Anemia acute blood loss anemia)   Plan: No blood needed currently  ENDO No specific problem   Plan: CPM  Global Issues  It is looking more and more like the patient will need an amputation in the near future  Dialysis to start today or tomorrow.    LOS: 9 days   Additional comments:I reviewed the patient's new clinical lab test results. cbc/bmet and I reviewed the patients new imaging test results. cxr  Critical Care Total Time*: 30 Minutes  Arsen Mangione 12/04/2014  *Care during the described time interval was provided by me and/or other providers on the critical care team.  I have reviewed this patient's available data, including medical history, events of note, physical examination and test results as part of my evaluation.

## 2014-12-04 NOTE — Anesthesia Postprocedure Evaluation (Signed)
  Anesthesia Post-op Note  Patient: Caleb [name]  Procedure(s) Performed: Procedure(s) with comments: INSERTION OF Right Internal Jugular DIALYSIS CATHETER. (Right) IRRIGATION AND DEBRIDEMENT EXTREMITY (Left) APPLICATION OF WOUND VAC (Left) - Lower and upper leg.  Patient Location: ICU  Anesthesia Type:General  Level of Consciousness: sedated, unresponsive and Patient remains intubated per anesthesia plan  Airway and Oxygen Therapy: Patient remains intubated per anesthesia plan  Post-op Pain: unable to evaluate due to sedation  Post-op Assessment: Post-op Vital signs reviewed, Patient's Cardiovascular Status Stable and Respiratory Function Stable LLE Motor Response: No movement to painful stimulus   RLE Motor Response: No movement to painful stimulus        Post-op Vital Signs: Reviewed and stable  Last Vitals:  Filed Vitals:   12/04/14 0728  BP:   Pulse:   Temp: 38 C  Resp:     Complications: No apparent anesthesia complications

## 2014-12-04 NOTE — Transfer of Care (Signed)
Immediate Anesthesia Transfer of Care Note  Patient: Caleb [name]  Procedure(s) Performed: Procedure(s) with comments: INSERTION OF Right Internal Jugular DIALYSIS CATHETER. (Right) IRRIGATION AND DEBRIDEMENT EXTREMITY (Left) APPLICATION OF WOUND VAC (Left) - Lower and upper leg.  Patient Location: ICU  Anesthesia Type:General  Level of Consciousness: sedated, unresponsive and Patient remains intubated per anesthesia plan  Airway & Oxygen Therapy: Patient remains intubated per anesthesia plan and Patient placed on Ventilator (see vital sign flow sheet for setting)  Post-op Assessment: Report given to RN and Post -op Vital signs reviewed and stable  Post vital signs: Reviewed and stable  Last Vitals:  Filed Vitals:   12/04/14 0728  BP:   Pulse:   Temp: 38 C  Resp:     Complications: No apparent anesthesia complications

## 2014-12-05 ENCOUNTER — Encounter (HOSPITAL_COMMUNITY): Payer: Self-pay | Admitting: Vascular Surgery

## 2014-12-05 LAB — CBC WITH DIFFERENTIAL/PLATELET
Basophils Absolute: 0 10*3/uL (ref 0.0–0.1)
Basophils Relative: 0 % (ref 0–1)
EOS PCT: 2 % (ref 0–5)
Eosinophils Absolute: 0.4 10*3/uL (ref 0.0–0.7)
HCT: 22.9 % — ABNORMAL LOW (ref 39.0–52.0)
HEMOGLOBIN: 7.4 g/dL — AB (ref 13.0–17.0)
Lymphocytes Relative: 5 % — ABNORMAL LOW (ref 12–46)
Lymphs Abs: 0.9 10*3/uL (ref 0.7–4.0)
MCH: 29 pg (ref 26.0–34.0)
MCHC: 32.3 g/dL (ref 30.0–36.0)
MCV: 89.8 fL (ref 78.0–100.0)
MONOS PCT: 4 % (ref 3–12)
Monocytes Absolute: 0.7 10*3/uL (ref 0.1–1.0)
NEUTROS PCT: 89 % — AB (ref 43–77)
Neutro Abs: 16.3 10*3/uL — ABNORMAL HIGH (ref 1.7–7.7)
Platelets: 400 10*3/uL (ref 150–400)
RBC: 2.55 MIL/uL — AB (ref 4.22–5.81)
RDW: 15.4 % (ref 11.5–15.5)
WBC Morphology: INCREASED
WBC: 18.3 10*3/uL — AB (ref 4.0–10.5)

## 2014-12-05 LAB — RENAL FUNCTION PANEL
Albumin: 1.9 g/dL — ABNORMAL LOW (ref 3.5–5.0)
Anion gap: 17 — ABNORMAL HIGH (ref 5–15)
BUN: 183 mg/dL — AB (ref 6–20)
CHLORIDE: 92 mmol/L — AB (ref 101–111)
CO2: 30 mmol/L (ref 22–32)
Calcium: 7 mg/dL — ABNORMAL LOW (ref 8.9–10.3)
Creatinine, Ser: 19.86 mg/dL — ABNORMAL HIGH (ref 0.61–1.24)
GFR, EST AFRICAN AMERICAN: 3 mL/min — AB (ref >60)
GFR, EST NON AFRICAN AMERICAN: 3 mL/min — AB (ref >60)
Glucose, Bld: 144 mg/dL — ABNORMAL HIGH (ref 65–99)
POTASSIUM: 5 mmol/L (ref 3.5–5.1)
Phosphorus: 11.4 mg/dL — ABNORMAL HIGH (ref 2.5–4.6)
Sodium: 139 mmol/L (ref 135–145)

## 2014-12-05 LAB — GLUCOSE, CAPILLARY
GLUCOSE-CAPILLARY: 120 mg/dL — AB (ref 65–99)
GLUCOSE-CAPILLARY: 134 mg/dL — AB (ref 65–99)
GLUCOSE-CAPILLARY: 143 mg/dL — AB (ref 65–99)
Glucose-Capillary: 151 mg/dL — ABNORMAL HIGH (ref 65–99)
Glucose-Capillary: 135 mg/dL — ABNORMAL HIGH (ref 65–99)
Glucose-Capillary: 135 mg/dL — ABNORMAL HIGH (ref 65–99)

## 2014-12-05 LAB — CK: CK TOTAL: 2806 U/L — AB (ref 49–397)

## 2014-12-05 MED ORDER — ALTEPLASE 100 MG IV SOLR
5.0000 mg | Freq: Once | INTRAVENOUS | Status: AC
  Administered 2014-12-05: 5 mg
  Filled 2014-12-05: qty 5

## 2014-12-05 MED ORDER — POLYETHYLENE GLYCOL 3350 17 G PO PACK
17.0000 g | PACK | Freq: Every day | ORAL | Status: DC
  Administered 2014-12-05 – 2014-12-08 (×4): 17 g via NASOGASTRIC
  Filled 2014-12-05 (×8): qty 1
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
## 2014-12-05 NOTE — Consult Note (Addendum)
WOC wound consult note Discussed plan of care with VVS PA Laurence Slate.  Pt went to surgery yesterday for Vac placement. Plan to change Vac dressings with their team as requested on Tues AM at 0730. Julien Girt MSN, RN, [place], [place], [place]
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
## 2014-12-05 NOTE — OR Nursing (Signed)
Late entry for charge capture.

## 2014-12-05 NOTE — Procedures (Signed)
Patient seen on Hemodialysis. QB 250, UF goal 0.5L (keeping even) Treatment adjusted as needed.  Elmarie Shiley MD [facility]. Office # [phone] Pager # [phone] 8:24 AM

## 2014-12-06 ENCOUNTER — Inpatient Hospital Stay (HOSPITAL_COMMUNITY): Payer: 59

## 2014-12-06 LAB — GLUCOSE, CAPILLARY
GLUCOSE-CAPILLARY: 109 mg/dL — AB (ref 65–99)
GLUCOSE-CAPILLARY: 125 mg/dL — AB (ref 65–99)
GLUCOSE-CAPILLARY: 130 mg/dL — AB (ref 65–99)
GLUCOSE-CAPILLARY: 136 mg/dL — AB (ref 65–99)
Glucose-Capillary: 136 mg/dL — ABNORMAL HIGH (ref 65–99)
Glucose-Capillary: 123 mg/dL — ABNORMAL HIGH (ref 65–99)
Glucose-Capillary: 130 mg/dL — ABNORMAL HIGH (ref 65–99)

## 2014-12-06 LAB — RENAL FUNCTION PANEL
ANION GAP: 15 (ref 5–15)
Albumin: 1.8 g/dL — ABNORMAL LOW (ref 3.5–5.0)
BUN: 175 mg/dL — ABNORMAL HIGH (ref 6–20)
CHLORIDE: 95 mmol/L — AB (ref 101–111)
CO2: 27 mmol/L (ref 22–32)
Calcium: 7.5 mg/dL — ABNORMAL LOW (ref 8.9–10.3)
Creatinine, Ser: 17.21 mg/dL — ABNORMAL HIGH (ref 0.61–1.24)
GFR, EST AFRICAN AMERICAN: 4 mL/min — AB (ref >60)
GFR, EST NON AFRICAN AMERICAN: 3 mL/min — AB (ref >60)
Glucose, Bld: 130 mg/dL — ABNORMAL HIGH (ref 65–99)
POTASSIUM: 4.8 mmol/L (ref 3.5–5.1)
Phosphorus: 10.9 mg/dL — ABNORMAL HIGH (ref 2.5–4.6)
Sodium: 137 mmol/L (ref 135–145)

## 2014-12-06 LAB — CBC
HEMATOCRIT: 23 % — AB (ref 39.0–52.0)
Hemoglobin: 7.5 g/dL — ABNORMAL LOW (ref 13.0–17.0)
MCH: 29.4 pg (ref 26.0–34.0)
MCHC: 32.6 g/dL (ref 30.0–36.0)
MCV: 90.2 fL (ref 78.0–100.0)
Platelets: 339 10*3/uL (ref 150–400)
RBC: 2.55 MIL/uL — AB (ref 4.22–5.81)
RDW: 15.1 % (ref 11.5–15.5)
WBC: 19 10*3/uL — AB (ref 4.0–10.5)

## 2014-12-06 LAB — HEPATITIS B SURFACE ANTIGEN: HEP B S AG: NEGATIVE

## 2014-12-06 LAB — CK: Total CK: 2314 U/L — ABNORMAL HIGH (ref 49–397)

## 2014-12-06 MED ORDER — PIPERACILLIN-TAZOBACTAM IN DEX 2-0.25 GM/50ML IV SOLN
2.2500 g | Freq: Three times a day (TID) | INTRAVENOUS | Status: DC
  Administered 2014-12-06 – 2014-12-07 (×4): 2.25 g via INTRAVENOUS
  Filled 2014-12-06 (×5): qty 50

## 2014-12-06 MED ORDER — METOCLOPRAMIDE HCL 5 MG/ML IJ SOLN
10.0000 mg | Freq: Four times a day (QID) | INTRAMUSCULAR | Status: DC
  Administered 2014-12-06 – 2014-12-12 (×19): 10 mg via INTRAVENOUS
  Filled 2014-12-06 (×30): qty 2

## 2014-12-06 NOTE — Progress Notes (Signed)
Pt remains sedated and on ventilator.  HD started yesterday, and he is receiving daily at present.  Plan return to OR tomorrow for more removal of dead muscle Lt LE vs Lt AKA.  IV Zosyn started for increase in WBC.  Family supportive and at bedside.    Will follow progress.  Reinaldo Raddle, RN, BSN  Trauma/Neuro ICU Case Manager [phone]

## 2014-12-06 NOTE — Progress Notes (Addendum)
[facility]   Subjective  - Intubated   Objective 130/74 116 97.6 F (36.4 C) (Axillary) 19 100%  Intake/Output Summary (Last 24 hours) at 12/06/14 0736 Last data filed at 12/06/14 0700  Gross per 24 hour  Intake 3079.6 ml  Output    870 ml  Net 2209.6 ml    Doppler DP/PT bilaterally biphasic Edema moderate left LE Lateral lower leg wound dry base, dark discoloredSS drainage, malodorous  Medial lower leg healthy beefy red wound base with minimal bleeding Upper left thigh beefy red bases medial and lateral. Moderate coagulated medial thigh wound - will start wet to dry dressing with mepitel BID Heart RRR Lungs intubated   Assessment/Planning: POD # WBC +20,951 net fluid load PRN HD pending BP, Cr 17.21 WBC increased to 19.0, afebrile.  Left Lower wound with necrotic tissue possible start IV antibiotics today and pre-op Will plan return to OR tomorrow/thursday for debridement of Left lateral leg wound. All Wound vacs changed, medial upper thigh will be wet to dry BID.    Laurence Slate [facility] 12/06/2014 7:36 AM --  Laboratory Lab Results:  Recent Labs  12/05/14 0415 12/06/14 0439  WBC 18.3* 19.0*  HGB 7.4* 7.5*  HCT 22.9* 23.0*  PLT 400 339   BMET  Recent Labs  12/05/14 0415 12/06/14 0439  NA 139 137  K 5.0 4.8  CL 92* 95*  CO2 30 27  GLUCOSE 144* 130*  BUN 183* 175*  CREATININE 19.86* 17.21*  CALCIUM 7.0* 7.5*    COAG Lab Results  Component Value Date   INR 1.49 11/26/2014   INR 1.53* 11/26/2014   INR 1.38 11/25/2014   No results found for: PTT    Addendum  I have independently interviewed and examined the patient, and I agree with the physician assistant's findings.  Residual muscle in lateral compartment looks marginal.  Will plan on returning to OR likely Thursday for redebridement.  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  Ideally would like to get his fluid balance closer to neutral before proceeding with AKA.  Adele Barthel, MD [facility] [place] Office: [phone] Pager: [phone]  12/06/2014, 9:11 AM
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
## 2014-12-06 NOTE — Consult Note (Signed)
Asked by Dr Bridgett Larsson to review patient's left leg for viability.  The left anterior/lateral compartment has already had extensive debridement.  The remaining muscle of the anterior/lateral compartment id non viable in the distal half of the leg.  Debridement has already been performed down to the level of the fibula.  Although the muscle on the posterior compartment has some viability, I do not think this would be a function limb.  Options would include further debridement but more realistically speaking in light of his current clinical situation he needs an above knee amputation.  Ruta Hinds, MD [facility] [place] [facility] Office: [phone] Pager: [phone]

## 2014-12-06 NOTE — Consult Note (Signed)
WOC wound follow-up consult note Left leg Vac dressing changes X 4 locations. Dr Geryl Councilman and PA at bedside to remove dressings and assess wounds since this is the first post-op dressing change from the trip to the [place] on Sunday.   Dr Oneida Alar arrived later to assess left lower leg wound.  Wound type: 4 full thickness post-op wounds: Left outer thigh full thickness wound; 100% beefy red. Mod amt clotted blood underneath previous dressing, small amt bloody drainage from wound bed.  Left inner thigh full thickness wound;  100% beefy red. Large amt clotted blood underneath previous dressing, small amt bloody drainage from wound bed. Vac dressing left off and Mepitel and moist gauze packing applied. Left outer calf full thickness fasciotomy; yellow pale woundbed, exposed bone, muscles and tendons. Mod amt clotted blood underneath previous dressing, small amt bloody drainage from wound bed.  Left inner calf full thickness fasciotomy; 100% beefy red, exposed muscles and tendons. Mod amt clotted blood underneath previous dressing, small amt bloody drainage from wound bed.  Dressing procedure/placement/frequency: Applied Mepitel contact layer over each wound bed to reduce adherence of Vac dressings, then applied one piece black foam and drape to left outer thigh and left inner calf.  2 pieces of black foam applied to left outer calf. Pt was sedated and intubated; he was medicated with extra pain meds for procedure, but still was grimacing. VVS team plans to change dressings in the OR on Wed or Thurs. Please re-consult if further assistance is needed.  Thank-you,  Julien Girt MSN, [place], [place], [place], [place]
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
## 2014-12-07 LAB — RENAL FUNCTION PANEL
ALBUMIN: 1.7 g/dL — AB (ref 3.5–5.0)
ALBUMIN: 1.7 g/dL — AB (ref 3.5–5.0)
Anion gap: 15 (ref 5–15)
Anion gap: 13 (ref 5–15)
BUN: 145 mg/dL — AB (ref 6–20)
BUN: 135 mg/dL — ABNORMAL HIGH (ref 6–20)
CALCIUM: 8.1 mg/dL — AB (ref 8.9–10.3)
CO2: 26 mmol/L (ref 22–32)
CO2: 26 mmol/L (ref 22–32)
CREATININE: 12.36 mg/dL — AB (ref 0.61–1.24)
Calcium: 8 mg/dL — ABNORMAL LOW (ref 8.9–10.3)
Chloride: 95 mmol/L — ABNORMAL LOW (ref 101–111)
Chloride: 98 mmol/L — ABNORMAL LOW (ref 101–111)
Creatinine, Ser: 14.32 mg/dL — ABNORMAL HIGH (ref 0.61–1.24)
GFR calc Af Amer: 5 mL/min — ABNORMAL LOW (ref >60)
GFR calc non Af Amer: 4 mL/min — ABNORMAL LOW (ref >60)
GFR, EST AFRICAN AMERICAN: 6 mL/min — AB (ref >60)
GFR, EST NON AFRICAN AMERICAN: 5 mL/min — AB (ref >60)
GLUCOSE: 142 mg/dL — AB (ref 65–99)
Glucose, Bld: 122 mg/dL — ABNORMAL HIGH (ref 65–99)
PHOSPHORUS: 9 mg/dL — AB (ref 2.5–4.6)
PHOSPHORUS: 7.6 mg/dL — AB (ref 2.5–4.6)
POTASSIUM: 5 mmol/L (ref 3.5–5.1)
Potassium: 4.4 mmol/L (ref 3.5–5.1)
SODIUM: 136 mmol/L (ref 135–145)
SODIUM: 137 mmol/L (ref 135–145)

## 2014-12-07 LAB — GLUCOSE, CAPILLARY
GLUCOSE-CAPILLARY: 111 mg/dL — AB (ref 65–99)
GLUCOSE-CAPILLARY: 112 mg/dL — AB (ref 65–99)
GLUCOSE-CAPILLARY: 134 mg/dL — AB (ref 65–99)
Glucose-Capillary: 124 mg/dL — ABNORMAL HIGH (ref 65–99)
Glucose-Capillary: 120 mg/dL — ABNORMAL HIGH (ref 65–99)
Glucose-Capillary: 120 mg/dL — ABNORMAL HIGH (ref 65–99)

## 2014-12-07 LAB — CBC
HEMATOCRIT: 22.8 % — AB (ref 39.0–52.0)
HEMOGLOBIN: 7.3 g/dL — AB (ref 13.0–17.0)
MCH: 28.9 pg (ref 26.0–34.0)
MCHC: 32 g/dL (ref 30.0–36.0)
MCV: 90.1 fL (ref 78.0–100.0)
Platelets: 219 10*3/uL (ref 150–400)
RBC: 2.53 MIL/uL — AB (ref 4.22–5.81)
RDW: 15 % (ref 11.5–15.5)
WBC: 26.4 10*3/uL — AB (ref 4.0–10.5)

## 2014-12-07 LAB — POCT I-STAT 3, ART BLOOD GAS (G3+)
Acid-Base Excess: 2 mmol/L (ref 0.0–2.0)
BICARBONATE: 27.7 meq/L — AB (ref 20.0–24.0)
O2 SAT: 99 %
TCO2: 29 mmol/L (ref 0–100)
pCO2 arterial: 45.5 mmHg — ABNORMAL HIGH (ref 35.0–45.0)
pH, Arterial: 7.389 (ref 7.350–7.450)
pO2, Arterial: 144 mmHg — ABNORMAL HIGH (ref 80.0–100.0)

## 2014-12-07 LAB — HEPATITIS B CORE ANTIBODY, TOTAL: HEP B C TOTAL AB: NEGATIVE

## 2014-12-07 LAB — HEPATITIS B SURFACE ANTIBODY,QUALITATIVE: HEP B S AB: REACTIVE

## 2014-12-07 LAB — CK: Total CK: 1414 U/L — ABNORMAL HIGH (ref 49–397)

## 2014-12-07 MED ORDER — PRISMASOL BGK 4/2.5 32-4-2.5 MEQ/L IV SOLN
INTRAVENOUS | Status: DC
  Filled 2014-12-07 (×2): qty 5000

## 2014-12-07 MED ORDER — PRISMASOL BGK 4/2.5 32-4-2.5 MEQ/L IV SOLN
INTRAVENOUS | Status: DC
  Administered 2014-12-07 – 2014-12-09 (×17): via INTRAVENOUS_CENTRAL
  Filled 2014-12-07 (×28): qty 5000

## 2014-12-07 MED ORDER — HEPARIN SODIUM (PORCINE) 1000 UNIT/ML DIALYSIS
1000.0000 [IU] | INTRAMUSCULAR | Status: DC | PRN
  Administered 2014-12-08: 5000 [IU] via INTRAVENOUS_CENTRAL
  Administered 2014-12-12: 4600 [IU] via INTRAVENOUS_CENTRAL
  Filled 2014-12-07: qty 2
  Filled 2014-12-07 (×2): qty 6
  Filled 2014-12-07: qty 5
  Filled 2014-12-07 (×2): qty 6
  Filled 2014-12-07: qty 5

## 2014-12-07 MED ORDER — PRISMASOL BGK 4/2.5 32-4-2.5 MEQ/L IV SOLN
INTRAVENOUS | Status: DC
  Administered 2014-12-07 – 2014-12-12 (×5): via INTRAVENOUS_CENTRAL
  Filled 2014-12-07 (×8): qty 5000

## 2014-12-07 MED ORDER — HALOPERIDOL LACTATE 5 MG/ML IJ SOLN
5.0000 mg | Freq: Four times a day (QID) | INTRAMUSCULAR | Status: DC | PRN
  Administered 2014-12-07 – 2014-12-08 (×3): 5 mg via INTRAVENOUS
  Filled 2014-12-07 (×3): qty 1

## 2014-12-07 MED ORDER — PRISMASOL BGK 4/2.5 32-4-2.5 MEQ/L IV SOLN
INTRAVENOUS | Status: DC
  Administered 2014-12-07 – 2014-12-12 (×9): via INTRAVENOUS_CENTRAL
  Filled 2014-12-07 (×11): qty 5000

## 2014-12-07 MED ORDER — PIPERACILLIN-TAZOBACTAM 3.375 G IVPB 30 MIN
3.3750 g | Freq: Four times a day (QID) | INTRAVENOUS | Status: DC
  Administered 2014-12-07 – 2014-12-12 (×19): 3.375 g via INTRAVENOUS
  Filled 2014-12-07 (×22): qty 50

## 2014-12-07 MED ORDER — PRISMASOL BGK 4/2.5 32-4-2.5 MEQ/L IV SOLN
INTRAVENOUS | Status: DC
  Filled 2014-12-07: qty 5000

## 2014-12-07 MED ORDER — PRISMASOL BGK 4/2.5 32-4-2.5 MEQ/L IV SOLN
INTRAVENOUS | Status: DC
  Filled 2014-12-07 (×6): qty 5000

## 2014-12-07 MED ORDER — PIPERACILLIN-TAZOBACTAM 3.375 G IVPB
3.3750 g | Freq: Four times a day (QID) | INTRAVENOUS | Status: DC
  Filled 2014-12-07 (×2): qty 50

## 2014-12-07 MED ORDER — SODIUM CHLORIDE 0.9 % IV SOLN
INTRAVENOUS | Status: DC
  Administered 2014-12-07: 20 mL via INTRAVENOUS
  Administered 2014-12-08 (×2): via INTRAVENOUS
  Administered 2014-12-08: 20 mL via INTRAVENOUS
  Administered 2014-12-10 – 2014-12-11 (×2): via INTRAVENOUS

## 2014-12-07 MED ORDER — SODIUM CHLORIDE 0.9 % FOR CRRT
INTRAVENOUS_CENTRAL | Status: DC | PRN
  Filled 2014-12-07: qty 1000

## 2014-12-07 NOTE — Progress Notes (Signed)
Follow up - Trauma and Critical Care  Patient Details:    Caleb [name] is an 24 y.o. male.  Lines/tubes : Airway 7.5 mm (Active)  Secured at (cm) 24 cm 12/07/2014  3:45 AM  Measured From Lips 12/07/2014  3:45 AM  Secured Location Right 12/07/2014  3:45 AM  Secured By [employer] 12/07/2014  3:45 AM  Tube Holder Repositioned Yes 12/07/2014  3:45 AM  Cuff Pressure (cm H2O) 25 cm H2O 12/06/2014  3:01 AM  Site Condition Dry 12/07/2014  3:45 AM     CVC Triple Lumen 11/25/14 Left Subclavian (Active)  Indication for Insertion or Continuance of Line Limited venous access - need for IV therapy >5 days (PICC only) 12/06/2014  8:00 PM  Site Assessment Clean;Dry;Intact 12/06/2014  8:00 PM  Proximal Lumen Status Infusing 12/06/2014  8:00 PM  Medial Capped (Central line) 12/06/2014  8:00 PM  Distal Lumen Status Infusing 12/06/2014  8:00 PM  Dressing Type Transparent;Occlusive 12/06/2014  8:00 PM  Dressing Status Clean;Dry;Intact;Antimicrobial disc in place 12/06/2014  8:00 PM  Line Care Connections checked and tightened 12/06/2014  8:00 PM  Dressing Intervention Dressing changed;Antimicrobial disc changed 12/01/2014  4:00 AM  Dressing Change Due 12/08/14 12/06/2014  8:00 PM     Arterial Line 11/25/14 Left Radial (Active)  Site Assessment Clean;Dry;Intact 12/06/2014  8:00 PM  Line Status Pulsatile blood flow 12/06/2014  8:00 PM  Art Line Waveform Appropriate 12/06/2014  8:00 PM  Art Line Interventions Leveled;Connections checked and tightened;Flushed per protocol 12/06/2014  8:00 PM  Color/Movement/Sensation Capillary refill less than 3 sec;Cool fingers/toes 12/06/2014  8:00 PM  Dressing Type Transparent;Occlusive 12/06/2014  8:00 PM  Dressing Status Clean;Dry;Intact 12/06/2014  8:00 PM  Interventions Dressing changed 12/01/2014 10:00 AM  Dressing Change Due 12/08/14 12/06/2014  8:00 PM     Negative Pressure Wound Therapy Left;Lower;Medial;Other (Comment) (Active)  Last dressing change 12/06/14  12/06/2014  8:05 PM  Site / Wound Assessment Clean;Bleeding 12/06/2014  8:05 PM  Peri-wound Assessment Intact 12/06/2014  8:05 PM  Wound filler - Black foam 1 12/06/2014  8:05 PM  Cycle Continuous 12/06/2014  8:05 PM  Target Pressure (mmHg) 125 12/06/2014  8:05 PM  Canister Changed No 12/06/2014  8:05 PM  Dressing Status Intact 12/06/2014  8:05 PM  Drainage Amount Minimal 12/05/2014  7:50 PM  Drainage Description Serous 12/05/2014  7:50 PM  Output (mL) 25 mL 12/07/2014  7:00 AM     Negative Pressure Wound Therapy Leg Lower;Left;Lateral;Other (Comment) (Active)  Last dressing change 12/06/14 12/06/2014  8:05 PM  Site / Wound Assessment Bleeding 12/06/2014  8:05 PM  Peri-wound Assessment Bleeding 12/06/2014  8:05 PM  Wound filler - Black foam 2 12/06/2014  8:05 PM  Cycle Continuous 12/06/2014  8:05 PM  Target Pressure (mmHg) 125 12/06/2014  8:05 PM  Canister Changed No 12/06/2014  8:05 PM  Dressing Status Intact 12/06/2014  8:05 PM  Drainage Amount Minimal 12/06/2014  8:05 PM  Drainage Description Sanguineous 12/06/2014  8:05 PM  Output (mL) 100 mL 12/07/2014  7:00 AM     Negative Pressure Wound Therapy Thigh Left;Lateral;Upper (Active)  Last dressing change 12/06/14 12/06/2014  8:05 PM  Site / Wound Assessment Clean;Dry 12/06/2014  8:05 PM  Peri-wound Assessment Intact;Bleeding 12/06/2014  8:05 PM  Wound filler - Black foam 1 12/06/2014  8:05 PM  Cycle Continuous 12/06/2014  8:05 PM  Target Pressure (mmHg) 125 12/06/2014  8:05 PM  Canister Changed No 12/06/2014  8:05 PM  Dressing Status Intact  12/06/2014  8:05 PM  Drainage Amount Scant 12/06/2014  8:05 PM  Drainage Description Serosanguineous 12/06/2014  8:05 PM  Output (mL) 25 mL 12/07/2014  7:00 AM     NG/OG Tube Orogastric 14 Fr. Right mouth (Active)  Placement Verification Auscultation 12/06/2014  8:05 PM  Site Assessment Clean;Dry;Intact 12/06/2014  8:05 PM  Status Infusing tube feed 12/06/2014  8:05 PM  Drainage Appearance Yellow 12/06/2014  8:05 PM   Gastric Residual 225 mL 12/06/2014  8:05 PM  Intake (mL) 30 mL 12/06/2014  8:05 PM  Output (mL) 200 mL 12/07/2014  6:30 AM     Urethral Catheter C.Simmons,RN Latex;Straight-tip 16 Fr. (Active)  Indication for Insertion or Continuance of Catheter Unstable critical patients (first 24-48 hours);Aggressive IV diuresis;Peri-operative use for selective surgical procedure 12/07/2014  8:00 AM  Site Assessment Clean;Intact 12/06/2014  8:05 PM  Catheter Maintenance Bag below level of bladder;No dependent loops;Seal intact;Bag emptied prior to transport;Catheter secured;Drainage bag/tubing not touching floor;Insertion date on drainage bag 12/07/2014  8:00 AM  Collection Container Standard drainage bag 12/06/2014  8:05 PM  Securement Method Leg strap 12/06/2014  8:05 PM  Urinary Catheter Interventions Unclamped 12/06/2014  8:05 PM  Output (mL) 20 mL 12/07/2014  6:00 AM    Microbiology/Sepsis markers: Results for orders placed or performed during the hospital encounter of 11/25/14  MRSA PCR Screening     Status: None   Collection Time: 11/25/14 10:00 AM  Result Value Ref Range Status   MRSA by PCR NEGATIVE NEGATIVE Final    Comment:        The GeneXpert MRSA Assay (FDA approved for NASAL specimens only), is one component of a comprehensive MRSA colonization surveillance program. It is not intended to diagnose MRSA infection nor to guide or monitor treatment for MRSA infections.     Anti-infectives:  Anti-infectives    Start     Dose/Rate Route Frequency Ordered Stop   12/06/14 0900  piperacillin-tazobactam (ZOSYN) IVPB 2.25 g     2.25 g 100 mL/hr over 30 Minutes Intravenous Every 8 hours 12/06/14 0836        Best Practice/Protocols:  VTE Prophylaxis: Lovenox (prophylaxtic dose) and Mechanical GI Prophylaxis: Proton Pump Inhibitor Continous Sedation  Consults: Treatment Team:  Conrad Leawood, MD    Events:  Patient to go to the OR tomorrow for debridement  Subjective:    Overnight  Issues: Versed drip had to be added to regimen last evening because patient became very agitated.  Likely source of sepsis and renal compromise is the leg with significant nuscle necrosis.  Objective:  Vital signs for last 24 hours: Temp:  [97.8 F (36.6 C)-98.1 F (36.7 C)] 97.8 F (36.6 C) (09/14 0402) Pulse Rate:  [75-100] 95 (09/14 0600) Resp:  [12-22] 20 (09/14 0600) BP: (96-138)/(38-90) 128/70 mmHg (09/14 0600) SpO2:  [100 %] 100 % (09/14 0600) Arterial Line BP: (100-165)/(48-76) 145/63 mmHg (09/14 0600) FiO2 (%):  [30 %] 30 % (09/14 0600) Weight:  [103.5 kg (228 lb 2.8 oz)] 103.5 kg (228 lb 2.8 oz) (09/14 0500)  Hemodynamic parameters for last 24 hours:    Intake/Output from previous day: 09/13 0701 - 09/14 0700 In: 2930.4 [I.V.:2158.4; NG/GT:490; IV Piggyback:282] Out: 1909 [Urine:82; Emesis/NG output:200; Drains:625]  Intake/Output this shift: Total I/O In: 76 [I.V.:10; IV Piggyback:66] Out: -   Vent settings for last 24 hours: Vent Mode:  [-] PRVC FiO2 (%):  [30 %] 30 % Set Rate:  [12 bmp] 12 bmp Vt Set:  [600 mL] 600  mL PEEP:  [5 cmH20] 5 cmH20 Pressure Support:  [12 cmH20] 12 cmH20 Plateau Pressure:  [18 cmH20-19 cmH20] 18 cmH20  Physical Exam:  General: no respiratory distress and sedated and intermittently agitated. Neuro: nonfocal exam, confused, agitated and weakness left lower extremity Resp: clear to auscultation bilaterally CVS: regular rate and rhythm, S1, S2 normal, no murmur, click, rub or gallop Extremities: edema 4+ and no palpable pulse in the left leg  Results for orders placed or performed during the hospital encounter of 11/25/14 (from the past 24 hour(s))  Glucose, capillary     Status: Abnormal   Collection Time: 12/06/14 12:03 PM  Result Value Ref Range   Glucose-Capillary 123 (H) 65 - 99 mg/dL   Comment 1 Capillary Specimen   Glucose, capillary     Status: Abnormal   Collection Time: 12/06/14  4:02 PM  Result Value Ref Range    Glucose-Capillary 109 (H) 65 - 99 mg/dL   Comment 1 Capillary Specimen   Glucose, capillary     Status: Abnormal   Collection Time: 12/06/14  7:27 PM  Result Value Ref Range   Glucose-Capillary 130 (H) 65 - 99 mg/dL   Comment 1 Capillary Specimen    Comment 2 Notify RN    Comment 3 Document in Chart   Hepatitis B core antibody, total     Status: None   Collection Time: 12/06/14  8:54 PM  Result Value Ref Range   Hep B Core Total Ab Negative Negative  Hepatitis B surface antibody     Status: None   Collection Time: 12/06/14  8:54 PM  Result Value Ref Range   Hep B S Ab Reactive   Glucose, capillary     Status: Abnormal   Collection Time: 12/06/14 11:27 PM  Result Value Ref Range   Glucose-Capillary 136 (H) 65 - 99 mg/dL   Comment 1 Capillary Specimen    Comment 2 Notify RN    Comment 3 Document in Chart   Glucose, capillary     Status: Abnormal   Collection Time: 12/07/14  3:57 AM  Result Value Ref Range   Glucose-Capillary 124 (H) 65 - 99 mg/dL   Comment 1 Capillary Specimen    Comment 2 Notify RN    Comment 3 Document in Chart   Renal function panel     Status: Abnormal   Collection Time: 12/07/14  5:14 AM  Result Value Ref Range   Sodium 136 135 - 145 mmol/L   Potassium 5.0 3.5 - 5.1 mmol/L   Chloride 95 (L) 101 - 111 mmol/L   CO2 26 22 - 32 mmol/L   Glucose, Bld 142 (H) 65 - 99 mg/dL   BUN 145 (H) 6 - 20 mg/dL   Creatinine, Ser 14.32 (H) 0.61 - 1.24 mg/dL   Calcium 8.0 (L) 8.9 - 10.3 mg/dL   Phosphorus 9.0 (H) 2.5 - 4.6 mg/dL   Albumin 1.7 (L) 3.5 - 5.0 g/dL   GFR calc non Af Amer 4 (L) >60 mL/min   GFR calc Af Amer 5 (L) >60 mL/min   Anion gap 15 5 - 15  CK     Status: Abnormal   Collection Time: 12/07/14  5:14 AM  Result Value Ref Range   Total CK 1414 (H) 49 - 397 U/L  CBC     Status: Abnormal   Collection Time: 12/07/14  5:14 AM  Result Value Ref Range   WBC 26.4 (H) 4.0 - 10.5 K/uL   RBC 2.53 (L)  4.22 - 5.81 MIL/uL   Hemoglobin 7.3 (L) 13.0 - 17.0 g/dL    HCT 22.8 (L) 39.0 - 52.0 %   MCV 90.1 78.0 - 100.0 fL   MCH 28.9 26.0 - 34.0 pg   MCHC 32.0 30.0 - 36.0 g/dL   RDW 15.0 11.5 - 15.5 %   Platelets 219 150 - 400 K/uL     Assessment/Plan:   NEURO  Altered Mental Status:  agitation, delirium, pain and sedation   Plan: Not sure if all delirium is secondary to multiple medications or sepsis  PULM  Lungs are clear clinically and radiologically   Plan: CPM  CARDIO  Sinus Tachycardia   Plan: No specific treatment  RENAL  Oliguria (suspect ATN) Actue Renal Failure (due to rhabdomyolysis)   Plan: On dialysis and may need to convert to CRRT because of some blood pressure problems.  GI  No specific issues   Plan: Restart tube feedings.  ID  Empirically it seems as though the patient is septic from the dead muscle in his left leg.  It does not have to be infected for that to be the case, so antibiotics are just secondary..  Definitive control of the source is necessary.   Plan: CPM, debridement of leg tomorrow.  HEME  Anemia acute blood loss anemia, anemia of chronic disease, anemia of critical illness and anemia of renal disease)   Plan: No blood for now.  ENDO No specific issues.   Plan: CPM  Global Issues  The patient needs definitive control of the source of sepsis, and this may require an amputation, likely above the knee.  I will speak with the vascular surgeon about this an also speak with the mother afterwards.  The sooner this is controlled, the more likely he will respond systemically.    LOS: 12 days   Additional comments:I reviewed the patient's new clinical lab test results. cbc/bmet and I reviewed the patients new imaging test results. cxr from yesterday  Critical Care Total Time*: 45 Minutes  Alvah Lagrow 12/07/2014  *Care during the described time interval was provided by me and/or other providers on the critical care team.  I have reviewed this patient's available data, including medical history, events of note,  physical examination and test results as part of my evaluation.
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
## 2014-12-07 NOTE — Progress Notes (Signed)
45mg  versed wasted in sink.  Witnessed by [employer]. Warden Fillers RN
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
## 2014-12-07 NOTE — Progress Notes (Addendum)
  [facility]  Subjective  - Intubated alert 1 l removed with HD tolerated 1 hour prior to hypotension.  Plan for OR tomorrow for irrigation and debridement of left LE.   Objective 128/70 95 97.8 F (36.6 C) (Oral) 20 100%  Intake/Output Summary (Last 24 hours) at 12/07/14 0820 Last data filed at 12/07/14 0700  Gross per 24 hour  Intake 2776.63 ml  Output   1884 ml  Net 892.63 ml   Left LE wound vacs in place Doppler DP/PT bilateral LE Edema left LE moderate Active range of motion right LE Heart RRR Lungs intubated, not sedated   Assessment/Planning: 24 y.o. male is s/p:  1. Left groin exploration 2. Left femoral vein to common femoral vein bypass with reversed right greater saphenous vein  3. Left superficial femoral artery to superficial femoral artery bypass with Propaten Simple repair of chin laceration (3 cm) 12 Days Post-Op  And 1. 4 compartment fasciotomy left leg and placement of negative pressure dressings 2. Medial and lateral fasciotomies left thigh and placement of negative pressure dressings 9 Days Post-Op  And 1. Excision of dead muscle in anterior chamber 2. Negative pressure dressing x 3 5 Days Post-Op  And 1. Right internal jugular vein tunneled dialysis catheter placement 2. Right internal jugular vein cannulation under ultrasound guidance 3. Excision of dead muscle lateral compartment 4. Negative pressure dressing change x 4 3 Day Post-Op  Maintain wound vacs and left upper medial wet to dry dressing BID Plan OR tomorrow for irrigation and debridement of left lateral compartment.  Will access the next plan of possible amputation of left LE at that time.    Laurence Slate [facility] 12/07/2014 8:20 AM --  Laboratory Lab Results:  Recent Labs  12/06/14 0439 12/07/14 0514  WBC 19.0* 26.4*  HGB 7.5* 7.3*  HCT 23.0* 22.8*  PLT 339 219   BMET  Recent Labs  12/06/14 0439 12/07/14 0514  NA 137  136  K 4.8 5.0  CL 95* 95*  CO2 27 26  GLUCOSE 130* 142*  BUN 175* 145*  CREATININE 17.21* 14.32*  CALCIUM 7.5* 8.0*    COAG Lab Results  Component Value Date   INR 1.49 11/26/2014   INR 1.53* 11/26/2014   INR 1.38 11/25/2014   No results found for: PTT    Addendum  I have independently interviewed and examined the patient, and I agree with the physician assistant's findings.  Discussed the findings with the mother.  Will plan on Thurs going back to the OR for possible lateral compartment debridement vs. BKA vs AKA.  The BKA is a posterior flap based amputation, so it might be possible.  Will get some input from Dr. Sharol Given if available.  Adele Barthel, MD [facility] Office: [phone] Pager: [phone]  12/07/2014, 3:44 PM

## 2014-12-08 ENCOUNTER — Encounter (HOSPITAL_COMMUNITY): Payer: Self-pay | Admitting: Anesthesiology

## 2014-12-08 ENCOUNTER — Encounter (HOSPITAL_COMMUNITY): Admission: EM | Disposition: A | Discharge status: Rehabilitation Facility | Payer: Self-pay | Source: Home / Self Care

## 2014-12-08 ENCOUNTER — Inpatient Hospital Stay (HOSPITAL_COMMUNITY): Payer: 59 | Admitting: Anesthesiology

## 2014-12-08 HISTORY — PX: AMPUTATION: SHX166

## 2014-12-08 LAB — CBC
HCT: 25.7 % — ABNORMAL LOW (ref 39.0–52.0)
Hemoglobin: 8.7 g/dL — ABNORMAL LOW (ref 13.0–17.0)
MCH: 29.6 pg (ref 26.0–34.0)
MCHC: 33.9 g/dL (ref 30.0–36.0)
MCV: 87.4 fL (ref 78.0–100.0)
PLATELETS: 106 10*3/uL — AB (ref 150–400)
RBC: 2.94 MIL/uL — AB (ref 4.22–5.81)
RDW: 14.9 % (ref 11.5–15.5)
WBC: 16.3 10*3/uL — ABNORMAL HIGH (ref 4.0–10.5)

## 2014-12-08 LAB — CBC WITH DIFFERENTIAL/PLATELET
BASOS PCT: 1 %
Basophils Absolute: 0.2 10*3/uL — ABNORMAL HIGH (ref 0.0–0.1)
EOS PCT: 3 %
Eosinophils Absolute: 0.5 10*3/uL (ref 0.0–0.7)
HEMATOCRIT: 20.4 % — AB (ref 39.0–52.0)
HEMOGLOBIN: 6.5 g/dL — AB (ref 13.0–17.0)
LYMPHS ABS: 0.9 10*3/uL (ref 0.7–4.0)
Lymphocytes Relative: 5 %
MCH: 28.8 pg (ref 26.0–34.0)
MCHC: 31.9 g/dL (ref 30.0–36.0)
MCV: 90.3 fL (ref 78.0–100.0)
MONO ABS: 1.1 10*3/uL — AB (ref 0.1–1.0)
MONOS PCT: 6 %
Neutro Abs: 15.1 10*3/uL — ABNORMAL HIGH (ref 1.7–7.7)
Neutrophils Relative %: 85 %
Platelets: 204 10*3/uL (ref 150–400)
RBC: 2.26 MIL/uL — AB (ref 4.22–5.81)
RDW: 15.2 % (ref 11.5–15.5)
WBC Morphology: INCREASED
WBC: 17.8 10*3/uL — ABNORMAL HIGH (ref 4.0–10.5)

## 2014-12-08 LAB — GLUCOSE, CAPILLARY
GLUCOSE-CAPILLARY: 115 mg/dL — AB (ref 65–99)
GLUCOSE-CAPILLARY: 102 mg/dL — AB (ref 65–99)
GLUCOSE-CAPILLARY: 97 mg/dL (ref 65–99)
GLUCOSE-CAPILLARY: 124 mg/dL — AB (ref 65–99)
GLUCOSE-CAPILLARY: 128 mg/dL — AB (ref 65–99)

## 2014-12-08 LAB — RENAL FUNCTION PANEL
ANION GAP: 8 (ref 5–15)
Albumin: 1.8 g/dL — ABNORMAL LOW (ref 3.5–5.0)
Albumin: 1.8 g/dL — ABNORMAL LOW (ref 3.5–5.0)
Anion gap: 9 (ref 5–15)
BUN: 98 mg/dL — AB (ref 6–20)
BUN: 78 mg/dL — ABNORMAL HIGH (ref 6–20)
CALCIUM: 8 mg/dL — AB (ref 8.9–10.3)
CHLORIDE: 101 mmol/L (ref 101–111)
CO2: 27 mmol/L (ref 22–32)
CO2: 24 mmol/L (ref 22–32)
CREATININE: 8.77 mg/dL — AB (ref 0.61–1.24)
CREATININE: 7.03 mg/dL — AB (ref 0.61–1.24)
Calcium: 8.6 mg/dL — ABNORMAL LOW (ref 8.9–10.3)
Chloride: 104 mmol/L (ref 101–111)
GFR calc Af Amer: 9 mL/min — ABNORMAL LOW (ref >60)
GFR calc non Af Amer: 8 mL/min — ABNORMAL LOW (ref >60)
GFR, EST AFRICAN AMERICAN: 11 mL/min — AB (ref >60)
GFR, EST NON AFRICAN AMERICAN: 10 mL/min — AB (ref >60)
Glucose, Bld: 123 mg/dL — ABNORMAL HIGH (ref 65–99)
Glucose, Bld: 112 mg/dL — ABNORMAL HIGH (ref 65–99)
PHOSPHORUS: 6.3 mg/dL — AB (ref 2.5–4.6)
POTASSIUM: 4.7 mmol/L (ref 3.5–5.1)
Phosphorus: 6.1 mg/dL — ABNORMAL HIGH (ref 2.5–4.6)
Potassium: 4.7 mmol/L (ref 3.5–5.1)
SODIUM: 136 mmol/L (ref 135–145)
Sodium: 137 mmol/L (ref 135–145)

## 2014-12-08 LAB — POCT I-STAT 7, (LYTES, BLD GAS, ICA,H+H)
ACID-BASE EXCESS: 2 mmol/L (ref 0.0–2.0)
BICARBONATE: 26.4 meq/L — AB (ref 20.0–24.0)
Calcium, Ion: 1.1 mmol/L — ABNORMAL LOW (ref 1.12–1.23)
HCT: 20 % — ABNORMAL LOW (ref 39.0–52.0)
HEMOGLOBIN: 6.8 g/dL — AB (ref 13.0–17.0)
O2 SAT: 100 %
PCO2 ART: 37.8 mmHg (ref 35.0–45.0)
PH ART: 7.445 (ref 7.350–7.450)
PO2 ART: 382 mmHg — AB (ref 80.0–100.0)
Patient temperature: 35.2
Potassium: 5.1 mmol/L (ref 3.5–5.1)
Sodium: 134 mmol/L — ABNORMAL LOW (ref 135–145)
TCO2: 28 mmol/L (ref 0–100)

## 2014-12-08 LAB — POCT I-STAT 3, ART BLOOD GAS (G3+)
ACID-BASE EXCESS: 1 mmol/L (ref 0.0–2.0)
Bicarbonate: 26.9 meq/L — ABNORMAL HIGH (ref 20.0–24.0)
O2 SAT: 98 %
TCO2: 28 mmol/L (ref 0–100)
pCO2 arterial: 48.9 mmHg — ABNORMAL HIGH (ref 35.0–45.0)
pH, Arterial: 7.352 (ref 7.350–7.450)
pO2, Arterial: 121 mmHg — ABNORMAL HIGH (ref 80.0–100.0)

## 2014-12-08 LAB — PREPARE RBC (CROSSMATCH)

## 2014-12-08 LAB — MAGNESIUM: MAGNESIUM: 2.4 mg/dL (ref 1.7–2.4)

## 2014-12-08 LAB — CK: CK TOTAL: 757 U/L — AB (ref 49–397)

## 2014-12-08 SURGERY — AMPUTATION, ABOVE KNEE
Anesthesia: General | Site: Leg Upper | Laterality: Left

## 2014-12-08 MED ORDER — SODIUM CHLORIDE 0.9 % IV SOLN: 10.0000 mL/h | Freq: Once | INTRAVENOUS | Status: DC

## 2014-12-08 MED ORDER — PROPOFOL 10 MG/ML IV BOLUS
INTRAVENOUS | Status: DC | PRN
  Administered 2014-12-08: 100 mg via INTRAVENOUS

## 2014-12-08 MED ORDER — MIDAZOLAM HCL 2 MG/2ML IJ SOLN
INTRAMUSCULAR | Status: AC
  Filled 2014-12-08: qty 4

## 2014-12-08 MED ORDER — MIDAZOLAM HCL 5 MG/5ML IJ SOLN
INTRAMUSCULAR | Status: DC | PRN
  Administered 2014-12-08: 2 mg via INTRAVENOUS

## 2014-12-08 MED ORDER — PHENYLEPHRINE 40 MCG/ML (10ML) SYRINGE FOR IV PUSH (FOR BLOOD PRESSURE SUPPORT)
PREFILLED_SYRINGE | INTRAVENOUS | Status: AC
  Filled 2014-12-08: qty 10

## 2014-12-08 MED ORDER — SUCCINYLCHOLINE CHLORIDE 20 MG/ML IJ SOLN
INTRAMUSCULAR | Status: AC
  Filled 2014-12-08: qty 1

## 2014-12-08 MED ORDER — LIDOCAINE HCL (CARDIAC) 20 MG/ML IV SOLN
INTRAVENOUS | Status: AC
  Filled 2014-12-08: qty 5

## 2014-12-08 MED ORDER — ALBUMIN HUMAN 5 % IV SOLN
12.5000 g | Freq: Once | INTRAVENOUS | Status: AC
  Administered 2014-12-08: 12.5 g via INTRAVENOUS

## 2014-12-08 MED ORDER — SODIUM CHLORIDE 0.9 % IV SOLN
10.0000 mg | INTRAVENOUS | Status: DC | PRN
  Administered 2014-12-08: 25 ug/min via INTRAVENOUS

## 2014-12-08 MED ORDER — FENTANYL CITRATE (PF) 100 MCG/2ML IJ SOLN
INTRAMUSCULAR | Status: DC | PRN
  Administered 2014-12-08: 50 ug via INTRAVENOUS

## 2014-12-08 MED ORDER — ONDANSETRON HCL 4 MG/2ML IJ SOLN
INTRAMUSCULAR | Status: AC
  Filled 2014-12-08: qty 2

## 2014-12-08 MED ORDER — SODIUM CHLORIDE 0.9 % IJ SOLN
INTRAMUSCULAR | Status: AC
  Filled 2014-12-08: qty 10

## 2014-12-08 MED ORDER — EPHEDRINE SULFATE 50 MG/ML IJ SOLN
INTRAMUSCULAR | Status: AC
  Filled 2014-12-08: qty 1

## 2014-12-08 MED ORDER — FENTANYL CITRATE (PF) 250 MCG/5ML IJ SOLN
INTRAMUSCULAR | Status: AC
  Filled 2014-12-08: qty 5

## 2014-12-08 MED ORDER — ROCURONIUM BROMIDE 100 MG/10ML IV SOLN
INTRAVENOUS | Status: DC | PRN
  Administered 2014-12-08: 25 mg via INTRAVENOUS
  Administered 2014-12-08: 15 mg via INTRAVENOUS

## 2014-12-08 MED ORDER — 0.9 % SODIUM CHLORIDE (POUR BTL) OPTIME
TOPICAL | Status: DC | PRN
  Administered 2014-12-08: 1000 mL

## 2014-12-08 MED ORDER — ROCURONIUM BROMIDE 50 MG/5ML IV SOLN
INTRAVENOUS | Status: AC
  Filled 2014-12-08: qty 2

## 2014-12-08 MED ORDER — SODIUM CHLORIDE 0.9 % IV SOLN: Freq: Once | INTRAVENOUS | Status: DC

## 2014-12-08 MED ORDER — PHENYLEPHRINE HCL 10 MG/ML IJ SOLN
0.0000 ug/min | INTRAVENOUS | Status: DC
  Filled 2014-12-08: qty 1

## 2014-12-08 MED ORDER — ALBUMIN HUMAN 5 % IV SOLN
INTRAVENOUS | Status: AC
  Administered 2014-12-08: 12.5 g
  Filled 2014-12-08: qty 500

## 2014-12-08 MED ORDER — PHENYLEPHRINE HCL 10 MG/ML IJ SOLN
INTRAMUSCULAR | Status: DC | PRN
  Administered 2014-12-08: 80 ug via INTRAVENOUS
  Administered 2014-12-08: 120 ug via INTRAVENOUS

## 2014-12-08 SURGICAL SUPPLY — 51 items
BANDAGE ELASTIC 4 VELCRO ST LF (GAUZE/BANDAGES/DRESSINGS) IMPLANT
BANDAGE ELASTIC 6 VELCRO ST LF (GAUZE/BANDAGES/DRESSINGS) IMPLANT
BLADE SAW SAG 29X58X.64 (BLADE) ×3 IMPLANT
BNDG GAUZE ELAST 4 BULKY (GAUZE/BANDAGES/DRESSINGS) IMPLANT
BRUSH SCRUB EZ PLAIN DRY (MISCELLANEOUS) ×9 IMPLANT
CANISTER SUCTION 2500CC (MISCELLANEOUS) ×4 IMPLANT
CANISTER WOUND CARE 500ML ATS (WOUND CARE) ×3 IMPLANT
CLEANER TIP ELECTROSURG 2X2 (MISCELLANEOUS) ×3 IMPLANT
CLIP TI MEDIUM 6 (CLIP) ×3 IMPLANT
COVER SURGICAL LIGHT HANDLE (MISCELLANEOUS) ×8 IMPLANT
DRAPE INCISE IOBAN 66X45 STRL (DRAPES) IMPLANT
DRAPE ORTHO SPLIT 77X108 STRL (DRAPES)
DRAPE PROXIMA HALF (DRAPES) ×10 IMPLANT
DRAPE SURG ORHT 6 SPLT 77X108 (DRAPES) IMPLANT
DRSG VAC ATS LRG SENSATRAC (GAUZE/BANDAGES/DRESSINGS) ×3 IMPLANT
ELECT REM PT RETURN 9FT ADLT (ELECTROSURGICAL) ×4
ELECTRODE REM PT RTRN 9FT ADLT (ELECTROSURGICAL) ×2 IMPLANT
GAUZE SPONGE 4X4 12PLY STRL (GAUZE/BANDAGES/DRESSINGS) ×4 IMPLANT
GLOVE BIO SURGEON STRL SZ7 (GLOVE) ×7 IMPLANT
GLOVE BIOGEL PI IND STRL 6.5 (GLOVE) ×2 IMPLANT
GLOVE BIOGEL PI IND STRL 7.5 (GLOVE) ×2 IMPLANT
GLOVE BIOGEL PI INDICATOR 6.5 (GLOVE) ×4
GLOVE BIOGEL PI INDICATOR 7.5 (GLOVE) ×2
GOWN STRL REUS W/ TWL LRG LVL3 (GOWN DISPOSABLE) ×5 IMPLANT
GOWN STRL REUS W/ TWL XL LVL3 (GOWN DISPOSABLE) ×1 IMPLANT
GOWN STRL REUS W/TWL LRG LVL3 (GOWN DISPOSABLE) ×8
GOWN STRL REUS W/TWL XL LVL3 (GOWN DISPOSABLE) ×4
KIT BASIN OR (CUSTOM PROCEDURE TRAY) ×4 IMPLANT
KIT ROOM TURNOVER OR (KITS) ×4 IMPLANT
NS IRRIG 1000ML POUR BTL (IV SOLUTION) ×4 IMPLANT
PACK GENERAL/GYN (CUSTOM PROCEDURE TRAY) IMPLANT
PAD ARMBOARD 7.5X6 YLW CONV (MISCELLANEOUS) ×8 IMPLANT
SPONGE GAUZE 4X4 12PLY STER LF (GAUZE/BANDAGES/DRESSINGS) ×3 IMPLANT
SPONGE LAP 18X18 X RAY DECT (DISPOSABLE) ×15 IMPLANT
STAPLER VISISTAT 35W (STAPLE) ×6 IMPLANT
SUT ETHILON 2 0 FS 18 (SUTURE) ×12 IMPLANT
SUT ETHILON 3 0 PS 1 (SUTURE) IMPLANT
SUT MNCRL AB 4-0 PS2 18 (SUTURE) IMPLANT
SUT SILK 2 0 (SUTURE) ×8
SUT SILK 2-0 18XBRD TIE 12 (SUTURE) ×2 IMPLANT
SUT SILK 3 0 (SUTURE) ×8
SUT SILK 3-0 18XBRD TIE 12 (SUTURE) ×2 IMPLANT
SUT VIC AB 0 CT1 18XCR BRD 8 (SUTURE) ×1 IMPLANT
SUT VIC AB 0 CT1 8-18 (SUTURE) ×4
SUT VIC AB 2-0 CT1 18 (SUTURE) ×3 IMPLANT
SUT VIC AB 2-0 CTX 36 (SUTURE) IMPLANT
SUT VIC AB 3-0 SH 27 (SUTURE)
SUT VIC AB 3-0 SH 27X BRD (SUTURE) IMPLANT
SUT VIC AB 3-0 SH 8-18 (SUTURE) ×6 IMPLANT
TAPE CLOTH SURG 4X10 WHT LF (GAUZE/BANDAGES/DRESSINGS) ×3 IMPLANT
WATER STERILE IRR 1000ML POUR (IV SOLUTION) ×4 IMPLANT

## 2014-12-08 NOTE — Anesthesia Preprocedure Evaluation (Addendum)
Anesthesia Evaluation  Patient identified by MRN, date of birth, ID band Patient unresponsive  General Assessment Comment:Intubated and sedated  Reviewed: Allergy & Precautions, H&P , NPO status , Patient's Chart, lab work & pertinent test results  Airway Mallampati: Intubated       Dental no notable dental hx. (+) Dental Advisory Given   Pulmonary  VDRF   Pulmonary exam normal breath sounds clear to auscultation       Cardiovascular negative cardio ROS   Rhythm:Regular Rate:Normal     Neuro/Psych negative neurological ROS  negative psych ROS   GI/Hepatic negative GI ROS, Neg liver ROS,   Endo/Other  negative endocrine ROS  Renal/GU ARFRenal disease     Musculoskeletal   Abdominal   Peds  Hematology  (+) anemia ,   Anesthesia Other Findings   Reproductive/Obstetrics negative OB ROS                             Anesthesia Physical  Anesthesia Plan  ASA: IV  Anesthesia Plan: General   Post-op Pain Management:    Induction: Inhalational  Airway Management Planned: Oral ETT  Additional Equipment:   Intra-op Plan:   Post-operative Plan: Post-operative intubation/ventilation  Informed Consent: I have reviewed the patients History and Physical, chart, labs and discussed the procedure including the risks, benefits and alternatives for the proposed anesthesia with the patient or authorized representative who has indicated his/her understanding and acceptance.   Dental advisory given  Plan Discussed with: CRNA  Anesthesia Plan Comments:      Anesthesia Quick Evaluation

## 2014-12-08 NOTE — Transfer of Care (Signed)
Immediate Anesthesia Transfer of Care Note  Patient: Caleb [name]  Procedure(s) Performed: Procedure(s): AMPUTATION ABOVE KNEE AMPUTATION (Left)  Patient Location: SICU  Anesthesia Type:General  Level of Consciousness: Patient remains intubated per anesthesia plan  Airway & Oxygen Therapy: Patient remains intubated per anesthesia plan and Patient placed on Ventilator (see vital sign flow sheet for setting)  Post-op Assessment: Report given to RN and Post -op Vital signs reviewed and stable  Post vital signs: Reviewed and stable  Last Vitals:  Filed Vitals:   12/08/14 0900  BP: 105/48  Pulse: 77  Temp:   Resp: 14    Complications: No apparent anesthesia complications

## 2014-12-08 NOTE — H&P (Signed)
[facility] [place]  History and Physical Update  The patient was interviewed and re-examined.  The patient's previous History and Physical has been reviewed and is unchanged except for: interval wound changes.  Attempts to safely wake up the patient have failed and his kidney failure appears to be persisting.  Dr. Hulen Skains and I think this patient's best chance of getting renal recovery is L BKA vs AKA.  The family has decided to proceed forward. I discussed in depth the nature of below-the-knee amputation with the patient, including risks, benefits, and alternatives.  The patient is aware that the risks of below-the-knee amputation include but are not limited to: bleeding, infection, myocardial infarction, stroke, death, failure to heal amputation wound, and possible need for more proximal amputation.  The family is aware that he is at higher risk of failure of the L BKA given the prior fasciotomies and muscle debridement.  The family is aware of the risks and agrees proceed forward with the procedure.    Adele Barthel, MD [facility] [place] Office: [phone] Pager: [phone]  12/08/2014, 7:44 AM

## 2014-12-08 NOTE — Progress Notes (Signed)
CRITICAL VALUE ALERT  Critical value received:  Hgb 6.5  Date of notification:  12/08/14  Time of notification:  0651  Critical value read back: yes  Nurse who received alert:  A. Darcel Smalling RN  MD notified (1st page):  Dr. Barry Dienes  Time of first page:  [phone]  MD notified (2nd page):  Time of second page:  Responding MD:  Dr. Barry Dienes  Time MD responded: [phone]  Notified of Hgb. She will update oncoming team. Will be addressed during rounds. Richarda Blade RN

## 2014-12-08 NOTE — Op Note (Signed)
OPERATIVE NOTE   PROCEDURE: 1. Left above-the-knee amputation 2. Closure of medial and lateral thigh fasciotomies  PRE-OPERATIVE DIAGNOSIS: non-viable left calf muscle  POST-OPERATIVE DIAGNOSIS: same as above  SURGEON: Adele Barthel, MD  ASSISTANT(S): Gerri Lins, PAC   ANESTHESIA: general  ESTIMATED BLOOD LOSS: 30 cc  FINDING(S): 1. Frank pus draining from lateral fasciotomy  2. Viable thigh muscle 3. Extensive venous hypertension with extensive edema  SPECIMEN(S): left above-the-knee amputation  INDICATIONS:  Caleb [name] is a 24 y.o. male who presents with left calf dead muscle. This patient underwent surgical exploration and reconstruction of his left femoral vein and superficial femoral artery. This patient developed venous hypertension which contributed to development of a compartment syndrome. He underwent fasciotomies in the thigh and calf. I took the patient back to the OR twice for wound checks and had to sequentially excision the anterior compartment and then the lateral compartment. The patient' kidney function has not appropriately recovered, so it was felt the only chance of helping recovery was to amputate the leg. I discussed left above the knee vs. Below the knee amputation to accomplish this. I discussed in depth with the patient's mother the risks, benefits, and alternatives to this procedure. The patient is aware that the risk of this operation included but are not limited to: bleeding, infection, myocardial infarction, stroke, death, failure to heal amputation wound, and possible need for more proximal amputation. The patient is aware of the risks and agrees proceed forward with the procedure.  DESCRIPTION: After full informed written consent was obtained from the patient, the patient was brought back to the operating room, and placed supine upon the operating table. Prior to induction, the patient received IV antibiotics. The patient  was then prepped and draped in the standard fashion for an above-the-knee amputation. I placed a non-sterile tourniquet on the thigh prior to the procedure. After obtaining adequate anesthesia, the patient was prepped and draped in the standard fashion for a above-the-knee amputation. I marked out the anterior and posterior flaps for a fish-mouth type of amputation. I exsanguinated the leg with an Esmarch bandage and then inflated the tourniquet to 250 mm Hg. I made the incisions for these flaps, and then dissected through the subcutaneous tissue, fascia, and muscles circumferentially. This was extremely difficult due to anasarca and venous hypertension. I had to tie off multiple varicosities. I elevated the periosteal tissue 4 cm more proximal than the anterior skin flap. I then transected the femur with a power saw at this level. Then I smoothed out the rough edges of the bone with a rasp. At this point, the specimen was passed off the field as the above-the-knee amputation. At this point, I clamped all visibly bleeding arteries and veins using a combination of suture ligation with Vicryl suture and electrocautery. The tourniquet was then deflated at this point. Bleeding continued to be controlled with electrocautery and suture ligature. The stump was washed off with sterile normal saline and no further active bleeding was noted. I then tried to reapproximate the anterior and posterior fascia, but the residual femur appeared to be too long. I total, I resected another 3 cm of femur length. I reapproximated the anterior and posterior fascia with interrupted stitches of 2-0 Vicryl. This was completed along the entire length of anterior and posterior fascia until there were no more loose space in the fascial line. The skin was then reapproximated with staples. The stump was washed off and dried. I then fashioned a VAC  sponge for the incision. I affixed this sponge over the staple line to  continue drainage of all serous fluid from the staple line. The VAC tubing was connected to the VAC pump at 125 continuous.  At this point, I placed a few interrupted 2-0 Nylon stitches to loosely reapproximate the medial and lateral incisions. Staples were placed in between the stitches to loosely reapproximate the skin in both incisions. Sterile dressing were applied to each incison.   COMPLICATIONS: none  CONDITION: stable   Adele Barthel, MD [facility] [place] Office: [phone] Pager: [phone]  12/08/2014, 12:08 PM
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
## 2014-12-08 NOTE — Progress Notes (Signed)
Patient remains in OR, ventilator is on standby at this time.

## 2014-12-08 NOTE — Progress Notes (Signed)
Patient ID: Caleb [name], male   DOB: 1990/07/04, 24 y.o.   MRN: [id number] SBP 60s post op. Got 4u PRBC in OR plus 1200cc fluid. Stump VAC and L groin dressings with no bleeding. Give Albumin 500cc. Check CBC. Neo PRN. CRRT going. Additional critical care time 43min. Georganna Skeans, MD, MPH, FACS Trauma: [phone] General Surgery: [phone]

## 2014-12-08 NOTE — Progress Notes (Deleted)
OPERATIVE NOTE   PROCEDURE: 1.  Left above-the-knee amputation 2.  Closure of medial and lateral thigh fasciotomies  PRE-OPERATIVE DIAGNOSIS: non-viable left calf muscle  POST-OPERATIVE DIAGNOSIS: same as above  SURGEON: Adele Barthel, MD  ASSISTANT(S): Gerri Lins, PAC   ANESTHESIA: general  ESTIMATED BLOOD LOSS: 30 cc  FINDING(S): 1.  Frank pus draining from lateral fasciotomy  2.  Viable thigh muscle 3.  Extensive venous hypertension with extensive edema  SPECIMEN(S):  left above-the-knee amputation  INDICATIONS:   Caleb [name] is a 24 y.o. male who presents with left calf dead muscle.  This patient underwent surgical exploration and reconstruction of his left femoral vein and superficial femoral artery.  This patient developed venous hypertension which contributed to development of a compartment syndrome. He underwent fasciotomies in the thigh and calf.  I took the patient back to the OR twice for wound checks and had to sequentially excision the anterior compartment and then the lateral compartment.  The patient' kidney function has not appropriately recovered, so it was felt the only chance of helping recovery was to amputate the leg.  I discussed left above the knee vs. Below the knee amputation to accomplish this.  I discussed in depth with the patient's mother the risks, benefits, and alternatives to this procedure.  The patient is aware that the risk of this operation included but are not limited to:  bleeding, infection, myocardial infarction, stroke, death, failure to heal amputation wound, and possible need for more proximal amputation.  The patient is aware of the risks and agrees proceed forward with the procedure.  DESCRIPTION: After full informed written consent was obtained from the patient, the patient was brought back to the operating room, and placed supine upon the operating table.  Prior to induction, the patient received IV antibiotics.  The patient  was then prepped and draped in the standard fashion for an above-the-knee amputation.  I placed a non-sterile tourniquet on the thigh prior to the procedure.  After obtaining adequate anesthesia, the patient was prepped and draped in the standard fashion for a above-the-knee amputation.  I marked out the anterior and posterior flaps for a fish-mouth type of amputation.  I exsanguinated the leg with an Esmarch bandage and then inflated the tourniquet to 250 mm Hg.   I made the incisions for these flaps, and then dissected through the subcutaneous tissue, fascia, and muscles circumferentially.  This was extremely difficult due to anasarca and venous hypertension.  I had to tie off multiple varicosities.   I elevated  the periosteal tissue 4 cm more proximal than the anterior skin flap.  I then transected the femur with a power saw at this level.  Then I smoothed out the rough edges of the bone with a rasp.  At this point, the specimen was passed off the field as the above-the-knee amputation.  At this point, I clamped all visibly bleeding arteries and veins using a combination of suture ligation with Vicryl suture and electrocautery.  The tourniquet was then deflated at this point.  Bleeding continued to be controlled with electrocautery and suture ligature.  The stump was washed off with sterile normal saline and no further active bleeding was noted.  I then tried to reapproximate the anterior and posterior fascia, but the residual femur appeared to be too long.  I total, I resected another 3 cm of femur length.  I reapproximated the anterior and posterior fascia  with interrupted stitches of 2-0 Vicryl.  This  was completed along the entire length of anterior and posterior fascia until there were no more loose space in the fascial line.  The skin was then  reapproximated with staples.  The stump was washed off and dried. I then fashioned a VAC sponge for the incision.  I affixed this sponge over the staple line to  continue drainage of all serous fluid from the staple line.  The VAC tubing was connected to the VAC pump at 125 continuous.  At this point, I placed a few interrupted 2-0 Nylon stitches to loosely reapproximate the medial and lateral incisions.  Staples were placed in between the stitches to loosely reapproximate the skin in both incisions.  Sterile dressing were applied to each incison.   COMPLICATIONS: none  CONDITION: stable   Adele Barthel, MD [facility] [place] Office: [phone] Pager: [phone]  12/08/2014, 12:08 PM

## 2014-12-08 NOTE — Progress Notes (Signed)
RT note- ventilator changed per policy, routine.
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
## 2014-12-09 ENCOUNTER — Encounter (HOSPITAL_COMMUNITY): Payer: Self-pay | Admitting: Vascular Surgery

## 2014-12-09 LAB — GLUCOSE, CAPILLARY
GLUCOSE-CAPILLARY: 110 mg/dL — AB (ref 65–99)
GLUCOSE-CAPILLARY: 106 mg/dL — AB (ref 65–99)
GLUCOSE-CAPILLARY: 104 mg/dL — AB (ref 65–99)
Glucose-Capillary: 104 mg/dL — ABNORMAL HIGH (ref 65–99)
Glucose-Capillary: 115 mg/dL — ABNORMAL HIGH (ref 65–99)
Glucose-Capillary: 120 mg/dL — ABNORMAL HIGH (ref 65–99)

## 2014-12-09 LAB — RENAL FUNCTION PANEL
ALBUMIN: 2 g/dL — AB (ref 3.5–5.0)
ALBUMIN: 2 g/dL — AB (ref 3.5–5.0)
ANION GAP: 9 (ref 5–15)
Albumin: 2 g/dL — ABNORMAL LOW (ref 3.5–5.0)
Anion gap: 8 (ref 5–15)
Anion gap: 9 (ref 5–15)
BUN: 56 mg/dL — AB (ref 6–20)
BUN: 56 mg/dL — ABNORMAL HIGH (ref 6–20)
BUN: 43 mg/dL — ABNORMAL HIGH (ref 6–20)
CALCIUM: 8.6 mg/dL — AB (ref 8.9–10.3)
CALCIUM: 8.6 mg/dL — AB (ref 8.9–10.3)
CHLORIDE: 104 mmol/L (ref 101–111)
CO2: 25 mmol/L (ref 22–32)
CO2: 25 mmol/L (ref 22–32)
CO2: 25 mmol/L (ref 22–32)
CREATININE: 5.16 mg/dL — AB (ref 0.61–1.24)
CREATININE: 5.2 mg/dL — AB (ref 0.61–1.24)
Calcium: 8.7 mg/dL — ABNORMAL LOW (ref 8.9–10.3)
Chloride: 104 mmol/L (ref 101–111)
Chloride: 102 mmol/L (ref 101–111)
Creatinine, Ser: 4.54 mg/dL — ABNORMAL HIGH (ref 0.61–1.24)
GFR calc Af Amer: 17 mL/min — ABNORMAL LOW (ref >60)
GFR calc Af Amer: 17 mL/min — ABNORMAL LOW (ref >60)
GFR calc non Af Amer: 14 mL/min — ABNORMAL LOW (ref >60)
GFR calc non Af Amer: 17 mL/min — ABNORMAL LOW (ref >60)
GFR, EST AFRICAN AMERICAN: 20 mL/min — AB (ref >60)
GFR, EST NON AFRICAN AMERICAN: 14 mL/min — AB (ref >60)
GLUCOSE: 133 mg/dL — AB (ref 65–99)
GLUCOSE: 134 mg/dL — AB (ref 65–99)
GLUCOSE: 121 mg/dL — AB (ref 65–99)
PHOSPHORUS: 4.7 mg/dL — AB (ref 2.5–4.6)
PHOSPHORUS: 3.6 mg/dL (ref 2.5–4.6)
POTASSIUM: 5.5 mmol/L — AB (ref 3.5–5.1)
Phosphorus: 4.7 mg/dL — ABNORMAL HIGH (ref 2.5–4.6)
Potassium: 5.2 mmol/L — ABNORMAL HIGH (ref 3.5–5.1)
Potassium: 5.2 mmol/L — ABNORMAL HIGH (ref 3.5–5.1)
SODIUM: 137 mmol/L (ref 135–145)
SODIUM: 138 mmol/L (ref 135–145)
Sodium: 136 mmol/L (ref 135–145)

## 2014-12-09 LAB — CK: CK TOTAL: 527 U/L — AB (ref 49–397)

## 2014-12-09 LAB — MAGNESIUM: Magnesium: 2.4 mg/dL (ref 1.7–2.4)

## 2014-12-09 MED ORDER — FENTANYL CITRATE (PF) 100 MCG/2ML IJ SOLN
50.0000 ug | INTRAMUSCULAR | Status: DC | PRN
  Administered 2014-12-09 – 2014-12-10 (×3): 100 ug via INTRAVENOUS
  Administered 2014-12-11: 50 ug via INTRAVENOUS
  Administered 2014-12-11 – 2014-12-12 (×2): 100 ug via INTRAVENOUS
  Administered 2014-12-12: 50 ug via INTRAVENOUS
  Administered 2014-12-12: 100 ug via INTRAVENOUS
  Administered 2014-12-13: 50 ug via INTRAVENOUS
  Filled 2014-12-09 (×10): qty 2

## 2014-12-09 MED ORDER — LORAZEPAM 2 MG/ML IJ SOLN
1.0000 mg | INTRAMUSCULAR | Status: DC | PRN
  Administered 2014-12-09: 2 mg via INTRAVENOUS
  Administered 2014-12-09: 1 mg via INTRAVENOUS
  Administered 2014-12-10: 2 mg via INTRAVENOUS
  Administered 2014-12-10: 1 mg via INTRAVENOUS
  Filled 2014-12-09 (×4): qty 1

## 2014-12-09 MED ORDER — METOPROLOL TARTRATE 1 MG/ML IV SOLN
5.0000 mg | INTRAVENOUS | Status: DC
  Administered 2014-12-09 – 2014-12-13 (×17): 5 mg via INTRAVENOUS
  Filled 2014-12-09 (×30): qty 5

## 2014-12-09 MED ORDER — PRO-STAT SUGAR FREE PO LIQD
30.0000 mL | Freq: Three times a day (TID) | ORAL | Status: DC
  Filled 2014-12-09 (×13): qty 30

## 2014-12-09 MED ORDER — LABETALOL HCL 5 MG/ML IV SOLN
INTRAVENOUS | Status: AC
  Administered 2014-12-09: 10 mg via INTRAVENOUS
  Filled 2014-12-09: qty 4

## 2014-12-09 MED ORDER — PRISMASOL BGK 0/2.5 32-2.5 MEQ/L IV SOLN
INTRAVENOUS | Status: DC
  Filled 2014-12-09 (×6): qty 5000

## 2014-12-09 MED ORDER — PRISMASOL BGK 0/2.5 32-2.5 MEQ/L IV SOLN
INTRAVENOUS | Status: DC
  Administered 2014-12-09 – 2014-12-12 (×20): via INTRAVENOUS_CENTRAL
  Filled 2014-12-09 (×31): qty 5000

## 2014-12-09 MED ORDER — BOOST / RESOURCE BREEZE PO LIQD
1.0000 | Freq: Three times a day (TID) | ORAL | Status: DC
  Administered 2014-12-12 (×2): 1 via ORAL

## 2014-12-09 MED ORDER — LABETALOL HCL 5 MG/ML IV SOLN
10.0000 mg | Freq: Once | INTRAVENOUS | Status: AC
  Administered 2014-12-09: 10 mg via INTRAVENOUS

## 2014-12-09 MED ORDER — ACETAMINOPHEN 160 MG/5ML PO SOLN
650.0000 mg | Freq: Four times a day (QID) | ORAL | Status: DC | PRN
  Administered 2014-12-09 – 2014-12-17 (×3): 650 mg via ORAL
  Filled 2014-12-09 (×3): qty 20.3
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
## 2014-12-09 NOTE — Progress Notes (Signed)
Renal MD call regarding elevated potassium level. Orders given to change pre-filter fluids to 333ml/hr, post-filter fluids to 423ml/hr and change dialysate to prismasol 0/2.5 solution but keep the same rate of 2042ml/hr. This order was read back to the MD to clarify for accuracy. Will cont to monitor and assess.

## 2014-12-09 NOTE — Procedures (Signed)
Extubation Procedure Note  Patient Details:   Name: KEDAR SEDANO DOB: [birth year] MRN: [id number]   Airway Documentation:     Evaluation  O2 sats: stable throughout Complications: No apparent complications Patient did tolerate procedure well. Bilateral Breath Sounds: Clear, Diminished Suctioning: Airway Yes   Patient extubated to 2L nasal cannula per MD order.  Positive cuff leak noted.  No evidence of stridor.  Sats currently 99%.  Vitals are stable.  Patient able to speak post extubation.  No apparent complications.   Moroni, Nester 12/09/2014, 8:59 AM
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
## 2014-12-09 NOTE — Progress Notes (Signed)
Wasted 4ml iv fent. Witnessed by second [employer]
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
## 2014-12-09 NOTE — Consult Note (Addendum)
WOC follow-up: Pt went back to the OR on Thurs for amputation and Vac application to incisional area. Discussed plan of care with PA, Terrence Dupont of the VVS team, who is following for plan of care. They do not need the Vac dressing changed today. Please re-consult if further assistance is needed.  Thank-you,  Julien Girt MSN, [place], [place], [place], [place]

## 2014-12-09 NOTE — Progress Notes (Signed)
Wasted 14ml IV versed from bag. Witnessed by second RN Kathleen Argue.
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
## 2014-12-09 NOTE — Progress Notes (Signed)
Patient ID: Caleb [name], male   DOB: 03-Sep-1990, 24 y.o.   MRN: [id number] HTN and tachy. Will schedule lopressor. I spoke with his family. Georganna Skeans, MD, MPH, FACS Trauma: [phone] General Surgery: [phone]

## 2014-12-09 NOTE — Progress Notes (Signed)
Trauma MD called to make aware of BP 140s-180s/60s-80s and HR 140s-150s. No orders given at this time Will continue to monitor and assess pt.
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
## 2014-12-10 LAB — BASIC METABOLIC PANEL
Anion gap: 11 (ref 5–15)
BUN: 33 mg/dL — AB (ref 6–20)
CHLORIDE: 99 mmol/L — AB (ref 101–111)
CO2: 25 mmol/L (ref 22–32)
Calcium: 8.4 mg/dL — ABNORMAL LOW (ref 8.9–10.3)
Creatinine, Ser: 4.29 mg/dL — ABNORMAL HIGH (ref 0.61–1.24)
GFR calc Af Amer: 21 mL/min — ABNORMAL LOW (ref >60)
GFR calc non Af Amer: 18 mL/min — ABNORMAL LOW (ref >60)
GLUCOSE: 121 mg/dL — AB (ref 65–99)
POTASSIUM: 4.3 mmol/L (ref 3.5–5.1)
Sodium: 135 mmol/L (ref 135–145)

## 2014-12-10 LAB — RENAL FUNCTION PANEL
Albumin: 2.2 g/dL — ABNORMAL LOW (ref 3.5–5.0)
Anion gap: 12 (ref 5–15)
BUN: 29 mg/dL — ABNORMAL HIGH (ref 6–20)
CHLORIDE: 99 mmol/L — AB (ref 101–111)
CO2: 25 mmol/L (ref 22–32)
CREATININE: 4.1 mg/dL — AB (ref 0.61–1.24)
Calcium: 8.8 mg/dL — ABNORMAL LOW (ref 8.9–10.3)
GFR, EST AFRICAN AMERICAN: 22 mL/min — AB (ref >60)
GFR, EST NON AFRICAN AMERICAN: 19 mL/min — AB (ref >60)
Glucose, Bld: 101 mg/dL — ABNORMAL HIGH (ref 65–99)
PHOSPHORUS: 3.9 mg/dL (ref 2.5–4.6)
POTASSIUM: 4 mmol/L (ref 3.5–5.1)
Sodium: 136 mmol/L (ref 135–145)

## 2014-12-10 LAB — CBC
HEMATOCRIT: 19 % — AB (ref 39.0–52.0)
HEMOGLOBIN: 6.3 g/dL — AB (ref 13.0–17.0)
MCH: 29 pg (ref 26.0–34.0)
MCHC: 33.2 g/dL (ref 30.0–36.0)
MCV: 87.6 fL (ref 78.0–100.0)
Platelets: 196 10*3/uL (ref 150–400)
RBC: 2.17 MIL/uL — AB (ref 4.22–5.81)
RDW: 15.6 % — ABNORMAL HIGH (ref 11.5–15.5)
WBC: 30.9 10*3/uL — ABNORMAL HIGH (ref 4.0–10.5)

## 2014-12-10 LAB — PREPARE RBC (CROSSMATCH)

## 2014-12-10 LAB — GLUCOSE, CAPILLARY
GLUCOSE-CAPILLARY: 115 mg/dL — AB (ref 65–99)
GLUCOSE-CAPILLARY: 101 mg/dL — AB (ref 65–99)
GLUCOSE-CAPILLARY: 90 mg/dL (ref 65–99)
Glucose-Capillary: 100 mg/dL — ABNORMAL HIGH (ref 65–99)

## 2014-12-10 LAB — HEMOGLOBIN AND HEMATOCRIT, BLOOD
HCT: 25.9 % — ABNORMAL LOW (ref 39.0–52.0)
HEMOGLOBIN: 8.7 g/dL — AB (ref 13.0–17.0)

## 2014-12-10 LAB — PHOSPHORUS: Phosphorus: 3.4 mg/dL (ref 2.5–4.6)

## 2014-12-10 LAB — MAGNESIUM: Magnesium: 2.5 mg/dL — ABNORMAL HIGH (ref 1.7–2.4)

## 2014-12-10 LAB — CK: Total CK: 972 U/L — ABNORMAL HIGH (ref 49–397)

## 2014-12-10 MED ORDER — SODIUM CHLORIDE 0.9 % IV SOLN
Freq: Once | INTRAVENOUS | Status: AC
  Administered 2014-12-10: 06:00:00 via INTRAVENOUS

## 2014-12-10 MED ORDER — ACETAMINOPHEN 650 MG RE SUPP
650.0000 mg | RECTAL | Status: DC | PRN
  Administered 2014-12-10 (×3): 650 mg via RECTAL
  Filled 2014-12-10 (×3): qty 1

## 2014-12-10 MED ORDER — ANTISEPTIC ORAL RINSE SOLUTION (CORINZ)
7.0000 mL | Freq: Four times a day (QID) | OROMUCOSAL | Status: DC
  Administered 2014-12-10 – 2014-12-13 (×12): 7 mL via OROMUCOSAL
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
## 2014-12-10 NOTE — Plan of Care (Signed)
Problem: Phase II Progression Outcomes Goal: Date pt extubated/weaned off vent Outcome: Completed/Met Date Met:  12/10/14 9/16 0800 extubated

## 2014-12-10 NOTE — Plan of Care (Signed)
Dr. Redmond Pulling notified of vomiting, orders received.

## 2014-12-10 NOTE — Plan of Care (Signed)
Temp 102.2, Dr. Redmond Pulling notified, order to keep giving the blood. Aware that pt vomited.

## 2014-12-10 NOTE — Plan of Care (Signed)
Dr. Redmond Pulling called about pt's diarrhea, flexiseal ordered, aware that stools do not smell like C diff or watery like C diff. Will continue to monitor.

## 2014-12-10 NOTE — Progress Notes (Signed)
CRITICAL VALUE ALERT  Critical value received:  hgb 6.3 Date of notification:  9/17  Time of notification:  0430  Critical value read back:Yes.    Nurse who received alert:  Jaynie Bream  MD notified (1st page):  0500  Responding MD:  Dr Grandville Silos

## 2014-12-10 NOTE — Plan of Care (Signed)
Problem: Phase II Progression Outcomes Goal: Time pt extubated/weaned off vent Outcome: Completed/Met Date Met:  12/10/14 9/16 at [place]

## 2014-12-10 NOTE — Progress Notes (Signed)
ANTIBIOTIC CONSULT NOTE - FOLLOW UP  Pharmacy Consult for Zosyn Indication: wound infection  Allergies  Allergen Reactions  . Other Itching and Rash    Tape or gauze or plastic wound dressings.      Patient Measurements: Height: 5\' 9"  (175.3 cm) Weight: 185 lb 6.5 oz (84.1 kg) IBW/kg (Calculated) : 70.7  Vital Signs: Temp: 101.7 F (38.7 C) (09/17 1203) Temp Source: Oral (09/17 1203) BP: 141/76 mmHg (09/17 1500) Pulse Rate: 117 (09/17 1500) Intake/Output from previous day: 09/16 0701 - 09/17 0700 In: 1429.8 [P.O.:105; I.V.:824.8; Blood:350; IV Piggyback:150] Out: 5958  Intake/Output from this shift: Total I/O In: 922.4 [I.V.:152.4; Blood:670; IV Piggyback:100] Out: 2487 [Urine:31; OITGP:4982; Stool:4]  Labs:  Recent Labs  12/08/14 0455  12/08/14 1345  12/09/14 0525 12/09/14 1525 12/10/14 0353 12/10/14 1146  WBC 17.8*  --  16.3*  --   --   --  30.9*  --   HGB 6.5*  < > 8.7*  --   --   --  6.3* 8.7*  PLT 204  --  106*  --   --   --  196  --   CREATININE  --   < >  --   < > 5.16* 4.54* 4.29*  --   < > = values in this interval not displayed. Estimated Creatinine Clearance: 26.8 mL/min (by C-G formula based on Cr of 4.29).  Assessment:  Day # 4 Zosyn for necrotic muscle LLE. POD#2 left AKA and closure of fasciotomies. No cultures from OR.  Wound VAC to be changed on 12/11/17.    Tmax 101.7, WBC up to 30.9.  Blood cultures sent. May broaden antibiotics.  On CRRT since 12/06/14. Zosyn dose remains appropriate.   Goal of Therapy:  appropriate Zosyn dose for renal function and infection  Plan:   Continue Zosyn 3.375 gm IV q6hrs while on CRRT.  Will follow up renal function and plans.  Follow up new blood cultures and antibiotic plans.  Arty Baumgartner, [place] Pager: [phone] 12/10/2014,3:20 PM

## 2014-12-10 NOTE — Plan of Care (Signed)
Will not keep stump elevated on pillow and wishes to twist up on left side. Explained importance of keeping stump aligned and turning. Has small spot on left buttock and left thigh, unable to keep sacral dressing on d/t stooling. Stools liquid with clumps, does not smell like C diff, (WBC up and pt has fever) will discuss with MD on next rounds.
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
## 2014-12-11 LAB — RENAL FUNCTION PANEL
ALBUMIN: 2.7 g/dL — AB (ref 3.5–5.0)
Albumin: 2.4 g/dL — ABNORMAL LOW (ref 3.5–5.0)
Anion gap: 13 (ref 5–15)
Anion gap: 17 — ABNORMAL HIGH (ref 5–15)
BUN: 24 mg/dL — AB (ref 6–20)
BUN: 29 mg/dL — AB (ref 6–20)
CALCIUM: 9.6 mg/dL (ref 8.9–10.3)
CO2: 25 mmol/L (ref 22–32)
CO2: 22 mmol/L (ref 22–32)
CREATININE: 3.84 mg/dL — AB (ref 0.61–1.24)
Calcium: 9.1 mg/dL (ref 8.9–10.3)
Chloride: 99 mmol/L — ABNORMAL LOW (ref 101–111)
Chloride: 95 mmol/L — ABNORMAL LOW (ref 101–111)
Creatinine, Ser: 3.96 mg/dL — ABNORMAL HIGH (ref 0.61–1.24)
GFR calc Af Amer: 23 mL/min — ABNORMAL LOW (ref >60)
GFR calc non Af Amer: 21 mL/min — ABNORMAL LOW (ref >60)
GFR calc non Af Amer: 20 mL/min — ABNORMAL LOW (ref >60)
GFR, EST AFRICAN AMERICAN: 24 mL/min — AB (ref >60)
GLUCOSE: 109 mg/dL — AB (ref 65–99)
Glucose, Bld: 105 mg/dL — ABNORMAL HIGH (ref 65–99)
PHOSPHORUS: 6.6 mg/dL — AB (ref 2.5–4.6)
POTASSIUM: 3.2 mmol/L — AB (ref 3.5–5.1)
Phosphorus: 6.1 mg/dL — ABNORMAL HIGH (ref 2.5–4.6)
Potassium: 3.8 mmol/L (ref 3.5–5.1)
SODIUM: 137 mmol/L (ref 135–145)
Sodium: 134 mmol/L — ABNORMAL LOW (ref 135–145)

## 2014-12-11 LAB — CK: CK TOTAL: 860 U/L — AB (ref 49–397)

## 2014-12-11 LAB — TYPE AND SCREEN
ABO/RH(D): A POS
Antibody Screen: NEGATIVE
UNIT DIVISION: 0
UNIT DIVISION: 0
Unit division: 0
Unit division: 0
Unit division: 0
Unit division: 0

## 2014-12-11 LAB — GLUCOSE, CAPILLARY
GLUCOSE-CAPILLARY: 93 mg/dL (ref 65–99)
GLUCOSE-CAPILLARY: 108 mg/dL — AB (ref 65–99)
Glucose-Capillary: 85 mg/dL (ref 65–99)

## 2014-12-11 LAB — CBC
HCT: 27.7 % — ABNORMAL LOW (ref 39.0–52.0)
Hemoglobin: 9.5 g/dL — ABNORMAL LOW (ref 13.0–17.0)
MCH: 30.1 pg (ref 26.0–34.0)
MCHC: 34.3 g/dL (ref 30.0–36.0)
MCV: 87.7 fL (ref 78.0–100.0)
PLATELETS: 259 10*3/uL (ref 150–400)
RBC: 3.16 MIL/uL — AB (ref 4.22–5.81)
RDW: 15.1 % (ref 11.5–15.5)
WBC: 36.1 10*3/uL — AB (ref 4.0–10.5)

## 2014-12-11 LAB — MAGNESIUM: Magnesium: 2.9 mg/dL — ABNORMAL HIGH (ref 1.7–2.4)

## 2014-12-11 MED ORDER — VANCOMYCIN HCL IN DEXTROSE 750-5 MG/150ML-% IV SOLN
750.0000 mg | INTRAVENOUS | Status: DC
  Administered 2014-12-12: 750 mg via INTRAVENOUS
  Filled 2014-12-11: qty 150

## 2014-12-11 MED ORDER — VANCOMYCIN HCL 10 G IV SOLR
1250.0000 mg | Freq: Once | INTRAVENOUS | Status: AC
  Administered 2014-12-11: 1250 mg via INTRAVENOUS
  Filled 2014-12-11: qty 1250

## 2014-12-11 NOTE — Progress Notes (Signed)
Flexiseal noted to have hole in balloon detection site, replaced with new Flexiseal
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
## 2014-12-11 NOTE — Plan of Care (Signed)
Problem: Problem: Diet/Nutrition Progression Goal: ADEQUATE NUTRITION Outcome: Not Progressing Pt with n/v, NG tube placed, hyperactive bowel sounds present with BMs, meds ordered Goal: ADEQUATE NUTRITION/ABSENCE OF UNCONTROLLED N/V Outcome: Progressing N/v has resolved

## 2014-12-11 NOTE — Progress Notes (Signed)
ANTIBIOTIC CONSULT NOTE - INITIAL  Pharmacy Consult for Vancomycin  Indication: r/o sepsis  Allergies  Allergen Reactions  . Other Itching and Rash    Tape or gauze or plastic wound dressings.      Patient Measurements: Height: 5\' 9"  (175.3 cm) Weight: 168 lb 14 oz (76.6 kg) IBW/kg (Calculated) : 70.7  Vital Signs: Temp: 98.7 F (37.1 C) (09/18 0400) Temp Source: Oral (09/18 0400) BP: 125/68 mmHg (09/18 0600) Pulse Rate: 107 (09/18 0600) Intake/Output from previous day: 09/17 0701 - 09/18 0700 In: 1416.9 [I.V.:456.9; Blood:670; NG/GT:90; IV Piggyback:200] Out: 1438 [Urine:121; Emesis/NG output:600; Stool:4] Intake/Output from this shift: Total I/O In: 413.3 [I.V.:223.3; NG/GT:90; IV Piggyback:100] Out: 3022 [Urine:60; Emesis/NG output:450; Other:2512]  Labs:  Recent Labs  12/08/14 1345  12/10/14 0353 12/10/14 1146 12/10/14 1517 12/11/14 0400  WBC 16.3*  --  30.9*  --   --  36.1*  HGB 8.7*  --  6.3* 8.7*  --  9.5*  PLT 106*  --  196  --   --  259  CREATININE  --   < > 4.29*  --  4.10* 3.84*  < > = values in this interval not displayed. Estimated Creatinine Clearance: 29.9 mL/min (by C-G formula based on Cr of 3.84). No results for input(s): VANCOTROUGH, VANCOPEAK, VANCORANDOM, GENTTROUGH, GENTPEAK, GENTRANDOM, TOBRATROUGH, TOBRAPEAK, TOBRARND, AMIKACINPEAK, AMIKACINTROU, AMIKACIN in the last 72 hours.   Microbiology: Recent Results (from the past 720 hour(s))  MRSA PCR Screening     Status: None   Collection Time: 11/25/14 10:00 AM  Result Value Ref Range Status   MRSA by PCR NEGATIVE NEGATIVE Final    Comment:        The GeneXpert MRSA Assay (FDA approved for NASAL specimens only), is one component of a comprehensive MRSA colonization surveillance program. It is not intended to diagnose MRSA infection nor to guide or monitor treatment for MRSA infections.     Assessment: 24 y.o. male with persistent fevers/leukocytosis, s/p recent AKA and on CVVHD,  for empiric antibiotics  Goal of Therapy:  Vancomycin trough level 15-20 mcg/ml  Plan:  Vancomycin 1250 mg IV now, then 750 mg IV q24h  Caryl Pina 12/11/2014,6:10 AM
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
## 2014-12-11 NOTE — Progress Notes (Signed)
Left AKA incisional VAC had to be replaced today. VAC appears to be functioning properly currently Left above-knee amputation stump healing appropriately    Dr. Bridgett Larsson to reevaluate tomorrow   Caleb [name]

## 2014-12-12 LAB — RENAL FUNCTION PANEL
ANION GAP: 18 — AB (ref 5–15)
Albumin: 3 g/dL — ABNORMAL LOW (ref 3.5–5.0)
BUN: 33 mg/dL — ABNORMAL HIGH (ref 6–20)
CALCIUM: 9.8 mg/dL (ref 8.9–10.3)
CHLORIDE: 96 mmol/L — AB (ref 101–111)
CO2: 21 mmol/L — AB (ref 22–32)
Creatinine, Ser: 3.59 mg/dL — ABNORMAL HIGH (ref 0.61–1.24)
GFR calc Af Amer: 26 mL/min — ABNORMAL LOW (ref >60)
GFR calc non Af Amer: 22 mL/min — ABNORMAL LOW (ref >60)
GLUCOSE: 136 mg/dL — AB (ref 65–99)
POTASSIUM: 3.4 mmol/L — AB (ref 3.5–5.1)
Phosphorus: 6.4 mg/dL — ABNORMAL HIGH (ref 2.5–4.6)
SODIUM: 135 mmol/L (ref 135–145)

## 2014-12-12 LAB — CBC WITH DIFFERENTIAL/PLATELET
BASOS PCT: 0 %
Basophils Absolute: 0 10*3/uL (ref 0.0–0.1)
EOS PCT: 0 %
Eosinophils Absolute: 0 10*3/uL (ref 0.0–0.7)
HCT: 30.1 % — ABNORMAL LOW (ref 39.0–52.0)
HEMOGLOBIN: 10.3 g/dL — AB (ref 13.0–17.0)
LYMPHS PCT: 5 %
Lymphs Abs: 1.8 10*3/uL (ref 0.7–4.0)
MCH: 29.9 pg (ref 26.0–34.0)
MCHC: 34.2 g/dL (ref 30.0–36.0)
MCV: 87.2 fL (ref 78.0–100.0)
MONOS PCT: 6 %
Monocytes Absolute: 2.1 10*3/uL — ABNORMAL HIGH (ref 0.1–1.0)
NEUTROS ABS: 31.6 10*3/uL — AB (ref 1.7–7.7)
Neutrophils Relative %: 89 %
Platelets: 403 10*3/uL — ABNORMAL HIGH (ref 150–400)
RBC: 3.45 MIL/uL — ABNORMAL LOW (ref 4.22–5.81)
RDW: 14.8 % (ref 11.5–15.5)
WBC: 35.5 10*3/uL — ABNORMAL HIGH (ref 4.0–10.5)

## 2014-12-12 LAB — MAGNESIUM: Magnesium: 3.1 mg/dL — ABNORMAL HIGH (ref 1.7–2.4)

## 2014-12-12 LAB — CK: Total CK: 645 U/L — ABNORMAL HIGH (ref 49–397)

## 2014-12-12 MED ORDER — PIPERACILLIN-TAZOBACTAM IN DEX 2-0.25 GM/50ML IV SOLN
2.2500 g | Freq: Three times a day (TID) | INTRAVENOUS | Status: DC
  Administered 2014-12-12 – 2014-12-16 (×10): 2.25 g via INTRAVENOUS
  Filled 2014-12-12 (×15): qty 50

## 2014-12-12 MED ORDER — LORAZEPAM 2 MG/ML IJ SOLN
1.0000 mg | INTRAMUSCULAR | Status: DC | PRN
  Administered 2014-12-13: 2 mg via INTRAVENOUS
  Filled 2014-12-12: qty 1

## 2014-12-12 MED ORDER — FENTANYL 50 MCG/HR TD PT72
50.0000 ug | MEDICATED_PATCH | TRANSDERMAL | Status: DC
  Administered 2014-12-13: 50 ug via TRANSDERMAL
  Filled 2014-12-12: qty 2

## 2014-12-12 NOTE — Progress Notes (Signed)
Rehab Admissions Coordinator Note:  Patient was screened by Retta Diones for appropriateness for an Inpatient Acute Rehab Consult.  At this time, we are recommending Inpatient Rehab consult.  Retta Diones 12/12/2014, 3:13 PM  I can be reached at [phone].
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
## 2014-12-12 NOTE — Progress Notes (Signed)
Pt s/p Lt AKA and closure of fasciotomies on 12/08/14.  CRRT discontinued today; nephrology assessing for intermittent dialysis.  PT evaluation done today; recommending CIR.  WBC remain elevated; continue IV Vanc and Zosyn.  Will continue to follow for discharge planning as pt progresses.    Reinaldo Raddle, RN, BSN  Trauma/Neuro ICU Case Manager [phone]

## 2014-12-12 NOTE — Progress Notes (Signed)
ANTIBIOTIC CONSULT NOTE - FOLLOW UP  Pharmacy Consult for Vancomycin and Zosyn Indication: necrotic muscle  Allergies  Allergen Reactions  . Other Itching and Rash    Tape or gauze or plastic wound dressings.      Patient Measurements: Height: 5\' 9"  (175.3 cm) Weight: 158 lb 8.2 oz (71.9 kg) IBW/kg (Calculated) : 70.7 Adjusted Body Weight:   Vital Signs: Temp: 97.9 F (36.6 C) (09/19 1200) Temp Source: Oral (09/19 0600) BP: 108/56 mmHg (09/19 1200) Pulse Rate: 137 (09/19 1200) Intake/Output from previous day: 09/18 0701 - 09/19 0700 In: 933.8 [I.V.:448.8; NG/GT:90; IV Piggyback:350] Out: 3267 [Urine:40; Emesis/NG output:350] Intake/Output from this shift: Total I/O In: 123.1 [I.V.:73.1; IV Piggyback:50] Out: 330 [Other:330]  Labs:  Recent Labs  12/10/14 0353 12/10/14 1146  12/11/14 0400 12/11/14 1706 12/12/14 0444 12/12/14 0839  WBC 30.9*  --   --  36.1*  --   --  35.5*  HGB 6.3* 8.7*  --  9.5*  --   --  10.3*  PLT 196  --   --  259  --   --  403*  CREATININE 4.29*  --   < > 3.84* 3.96* 3.59*  --   < > = values in this interval not displayed. Estimated Creatinine Clearance: 32 mL/min (by C-G formula based on Cr of 3.59). No results for input(s): VANCOTROUGH, VANCOPEAK, VANCORANDOM, GENTTROUGH, GENTPEAK, GENTRANDOM, TOBRATROUGH, TOBRAPEAK, TOBRARND, AMIKACINPEAK, AMIKACINTROU, AMIKACIN in the last 72 hours.   Microbiology: Recent Results (from the past 720 hour(s))  MRSA PCR Screening     Status: None   Collection Time: 11/25/14 10:00 AM  Result Value Ref Range Status   MRSA by PCR NEGATIVE NEGATIVE Final    Comment:        The GeneXpert MRSA Assay (FDA approved for NASAL specimens only), is one component of a comprehensive MRSA colonization surveillance program. It is not intended to diagnose MRSA infection nor to guide or monitor treatment for MRSA infections.   Culture, blood (routine x 2)     Status: None (Preliminary result)   Collection Time:  12/10/14  1:00 PM  Result Value Ref Range Status   Specimen Description BLOOD BLOOD RIGHT FOREARM  Final   Special Requests BOTTLES DRAWN AEROBIC ONLY 5CC  Final   Culture NO GROWTH < 24 HOURS  Final   Report Status PENDING  Incomplete  Culture, blood (routine x 2)     Status: None (Preliminary result)   Collection Time: 12/10/14  1:06 PM  Result Value Ref Range Status   Specimen Description BLOOD BLOOD RIGHT HAND  Final   Special Requests BOTTLES DRAWN AEROBIC ONLY 5CC  Final   Culture NO GROWTH 1 DAY  Final   Report Status PENDING  Incomplete    Anti-infectives    Start     Dose/Rate Route Frequency Ordered Stop   12/12/14 0600  vancomycin (VANCOCIN) IVPB 750 mg/150 ml premix     750 mg 150 mL/hr over 60 Minutes Intravenous Every 24 hours 12/11/14 0622     12/11/14 0700  vancomycin (VANCOCIN) 1,250 mg in sodium chloride 0.9 % 250 mL IVPB     1,250 mg 166.7 mL/hr over 90 Minutes Intravenous  Once 12/11/14 0622 12/11/14 0820   12/07/14 1500  piperacillin-tazobactam (ZOSYN) IVPB 3.375 g  Status:  Discontinued     3.375 g 12.5 mL/hr over 240 Minutes Intravenous Every 6 hours 12/07/14 0923 12/07/14 0925   12/07/14 1500  piperacillin-tazobactam (ZOSYN) IVPB 3.375 g  3.375 g 100 mL/hr over 30 Minutes Intravenous Every 6 hours 12/07/14 0925     12/06/14 0900  piperacillin-tazobactam (ZOSYN) IVPB 2.25 g  Status:  Discontinued     2.25 g 100 mL/hr over 30 Minutes Intravenous Every 8 hours 12/06/14 0836 12/07/14 4196      Assessment: 24yo male who has been on Vancomycin and Zosyn, dosed for CRRT.  Pt is now changing to intermittent HD and will require dosage adjustment.  CRRT stopping today and to be assessed daily for HD.  Zosyn will be adjusted and Vanc will be ordered as HD sessions are ordered.  Goal of Therapy:  pre-HD Vanc level 15-25  Plan:  D/C current Vancomycin order Change Zosyn to 2.25g IV q8 Plan Vanc 750mg  IV qHD, order as HD sessions are ordered F/U culture  results  Gracy Bruins, PharmD [facility]

## 2014-12-12 NOTE — Progress Notes (Signed)
Physical medicine rehabilitation consult requested. Patient presently not in room. Will follow-up with completion of consult
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
## 2014-12-12 NOTE — Progress Notes (Signed)
Trauma Service Note  Subjective: Patient is off the ventilator.  Very easily awakened.  Fidgety, no acute distress.  Objective: Vital signs in last 24 hours: Temp:  [98.1 F (36.7 C)-99.3 F (37.4 C)] 98.4 F (36.9 C) (09/19 0600) Pulse Rate:  [68-119] 119 (09/19 0600) Resp:  [18-29] 20 (09/19 0700) BP: (80-157)/(54-110) 82/57 mmHg (09/19 0700) SpO2:  [96 %-100 %] 100 % (09/19 0600) Arterial Line BP: (58-165)/(40-80) 90/44 mmHg (09/19 0700) Weight:  [71.9 kg (158 lb 8.2 oz)] 71.9 kg (158 lb 8.2 oz) (09/19 0500) Last BM Date: 12/11/14  Intake/Output from previous day: 09/18 0701 - 09/19 0700 In: 933.8 [I.V.:448.8; NG/GT:90; IV Piggyback:350] Out: 3086 [Urine:40; Emesis/NG output:350] Intake/Output this shift:    General: No acute distress.  Lungs: Clear to auscultation.  Oxygen saturations 99%.  Abd: Soft, good bowel sounds.  Extremities: No changes  Neuro: Seems a bit confused.  Restrained because of NGT and Foley.  Lab Results: CBC   Recent Labs  12/10/14 0353 12/10/14 1146 12/11/14 0400  WBC 30.9*  --  36.1*  HGB 6.3* 8.7* 9.5*  HCT 19.0* 25.9* 27.7*  PLT 196  --  259   BMET  Recent Labs  12/11/14 1706 12/12/14 0444  NA 134* 135  K 3.2* 3.4*  CL 95* 96*  CO2 22 21*  GLUCOSE 109* 136*  BUN 29* 33*  CREATININE 3.96* 3.59*  CALCIUM 9.1 9.8   PT/INR No results for input(s): LABPROT, INR in the last 72 hours. ABG No results for input(s): PHART, HCO3 in the last 72 hours.  Invalid input(s): PCO2, PO2  Studies/Results: No results found.  Anti-infectives: Anti-infectives    Start     Dose/Rate Route Frequency Ordered Stop   12/12/14 0600  vancomycin (VANCOCIN) IVPB 750 mg/150 ml premix     750 mg 150 mL/hr over 60 Minutes Intravenous Every 24 hours 12/11/14 0622     12/11/14 0700  vancomycin (VANCOCIN) 1,250 mg in sodium chloride 0.9 % 250 mL IVPB     1,250 mg 166.7 mL/hr over 90 Minutes Intravenous  Once 12/11/14 0622 12/11/14 0820   12/07/14 1500  piperacillin-tazobactam (ZOSYN) IVPB 3.375 g  Status:  Discontinued     3.375 g 12.5 mL/hr over 240 Minutes Intravenous Every 6 hours 12/07/14 0923 12/07/14 0925   12/07/14 1500  piperacillin-tazobactam (ZOSYN) IVPB 3.375 g     3.375 g 100 mL/hr over 30 Minutes Intravenous Every 6 hours 12/07/14 0925     12/06/14 0900  piperacillin-tazobactam (ZOSYN) IVPB 2.25 g  Status:  Discontinued     2.25 g 100 mL/hr over 30 Minutes Intravenous Every 8 hours 12/06/14 0836 12/07/14 0923      Assessment/Plan: s/p Procedure(s): AMPUTATION ABOVE KNEE AMPUTATION d/c foley No urine output so can discontinue Foley again.  Discontinue NGT Clear liquids. Discontinue all prokinetic bowel medications. Start PT. Maybe switch to dialysis versus CRRT   LOS: 17 days   Kathryne Eriksson. Dahlia Bailiff, MD, FACS [phone] Trauma Surgeon 12/12/2014

## 2014-12-13 DIAGNOSIS — S31109S Unspecified open wound of abdominal wall, unspecified quadrant without penetration into peritoneal cavity, sequela: Secondary | ICD-10-CM

## 2014-12-13 DIAGNOSIS — Z89612 Acquired absence of left leg above knee: Secondary | ICD-10-CM

## 2014-12-13 DIAGNOSIS — W3400XS Accidental discharge from unspecified firearms or gun, sequela: Secondary | ICD-10-CM

## 2014-12-13 LAB — CK: Total CK: 1511 U/L — ABNORMAL HIGH (ref 49–397)

## 2014-12-13 LAB — GLUCOSE, CAPILLARY
Glucose-Capillary: 111 mg/dL — ABNORMAL HIGH (ref 65–99)
Glucose-Capillary: 122 mg/dL — ABNORMAL HIGH (ref 65–99)
Glucose-Capillary: 125 mg/dL — ABNORMAL HIGH (ref 65–99)
Glucose-Capillary: 133 mg/dL — ABNORMAL HIGH (ref 65–99)

## 2014-12-13 LAB — RENAL FUNCTION PANEL
Albumin: 2.7 g/dL — ABNORMAL LOW (ref 3.5–5.0)
Albumin: 2.8 g/dL — ABNORMAL LOW (ref 3.5–5.0)
Albumin: 2.8 g/dL — ABNORMAL LOW (ref 3.5–5.0)
Anion gap: 19 — ABNORMAL HIGH (ref 5–15)
Anion gap: 21 — ABNORMAL HIGH (ref 5–15)
Anion gap: 21 — ABNORMAL HIGH (ref 5–15)
BUN: 60 mg/dL — ABNORMAL HIGH (ref 6–20)
BUN: 71 mg/dL — AB (ref 6–20)
BUN: 76 mg/dL — ABNORMAL HIGH (ref 6–20)
CHLORIDE: 83 mmol/L — AB (ref 101–111)
CO2: 19 mmol/L — ABNORMAL LOW (ref 22–32)
CO2: 18 mmol/L — AB (ref 22–32)
CO2: 19 mmol/L — ABNORMAL LOW (ref 22–32)
CREATININE: 8.19 mg/dL — AB (ref 0.61–1.24)
Calcium: 8.9 mg/dL (ref 8.9–10.3)
Calcium: 9.2 mg/dL (ref 8.9–10.3)
Calcium: 9.1 mg/dL (ref 8.9–10.3)
Chloride: 86 mmol/L — ABNORMAL LOW (ref 101–111)
Chloride: 85 mmol/L — ABNORMAL LOW (ref 101–111)
Creatinine, Ser: 6.72 mg/dL — ABNORMAL HIGH (ref 0.61–1.24)
Creatinine, Ser: 8.43 mg/dL — ABNORMAL HIGH (ref 0.61–1.24)
GFR calc Af Amer: 12 mL/min — ABNORMAL LOW (ref >60)
GFR calc Af Amer: 10 mL/min — ABNORMAL LOW (ref >60)
GFR calc Af Amer: 9 mL/min — ABNORMAL LOW (ref >60)
GFR calc non Af Amer: 10 mL/min — ABNORMAL LOW (ref >60)
GFR calc non Af Amer: 8 mL/min — ABNORMAL LOW (ref >60)
GFR, EST NON AFRICAN AMERICAN: 8 mL/min — AB (ref >60)
Glucose, Bld: 125 mg/dL — ABNORMAL HIGH (ref 65–99)
Glucose, Bld: 139 mg/dL — ABNORMAL HIGH (ref 65–99)
Glucose, Bld: 116 mg/dL — ABNORMAL HIGH (ref 65–99)
POTASSIUM: 3.3 mmol/L — AB (ref 3.5–5.1)
Phosphorus: 9.3 mg/dL — ABNORMAL HIGH (ref 2.5–4.6)
Phosphorus: 10.3 mg/dL — ABNORMAL HIGH (ref 2.5–4.6)
Phosphorus: 10.6 mg/dL — ABNORMAL HIGH (ref 2.5–4.6)
Potassium: 3.1 mmol/L — ABNORMAL LOW (ref 3.5–5.1)
Potassium: 3.2 mmol/L — ABNORMAL LOW (ref 3.5–5.1)
Sodium: 124 mmol/L — ABNORMAL LOW (ref 135–145)
Sodium: 122 mmol/L — ABNORMAL LOW (ref 135–145)
Sodium: 125 mmol/L — ABNORMAL LOW (ref 135–145)

## 2014-12-13 LAB — CBC WITH DIFFERENTIAL/PLATELET
BASOS ABS: 0 10*3/uL (ref 0.0–0.1)
Basophils Absolute: 0 10*3/uL (ref 0.0–0.1)
Basophils Relative: 0 %
Basophils Relative: 0 %
EOS ABS: 0 10*3/uL (ref 0.0–0.7)
Eosinophils Absolute: 0 10*3/uL (ref 0.0–0.7)
Eosinophils Relative: 0 %
Eosinophils Relative: 0 %
HCT: 22.9 % — ABNORMAL LOW (ref 39.0–52.0)
HCT: 23.2 % — ABNORMAL LOW (ref 39.0–52.0)
Hemoglobin: 8 g/dL — ABNORMAL LOW (ref 13.0–17.0)
Hemoglobin: 8.1 g/dL — ABNORMAL LOW (ref 13.0–17.0)
LYMPHS ABS: 2.6 10*3/uL (ref 0.7–4.0)
Lymphocytes Relative: 8 %
Lymphocytes Relative: 7 %
Lymphs Abs: 2.1 10*3/uL (ref 0.7–4.0)
MCH: 29.7 pg (ref 26.0–34.0)
MCH: 29.7 pg (ref 26.0–34.0)
MCHC: 34.9 g/dL (ref 30.0–36.0)
MCHC: 34.9 g/dL (ref 30.0–36.0)
MCV: 85.1 fL (ref 78.0–100.0)
MCV: 85 fL (ref 78.0–100.0)
MONO ABS: 2.6 10*3/uL — AB (ref 0.1–1.0)
Monocytes Absolute: 2.4 10*3/uL — ABNORMAL HIGH (ref 0.1–1.0)
Monocytes Relative: 8 %
Monocytes Relative: 8 %
NEUTROS PCT: 84 %
Neutro Abs: 27.9 10*3/uL — ABNORMAL HIGH (ref 1.7–7.7)
Neutro Abs: 26 10*3/uL — ABNORMAL HIGH (ref 1.7–7.7)
Neutrophils Relative %: 85 %
PLATELETS: 412 10*3/uL — AB (ref 150–400)
Platelets: 462 10*3/uL — ABNORMAL HIGH (ref 150–400)
RBC: 2.69 MIL/uL — AB (ref 4.22–5.81)
RBC: 2.73 MIL/uL — ABNORMAL LOW (ref 4.22–5.81)
RDW: 14.2 % (ref 11.5–15.5)
RDW: 14.1 % (ref 11.5–15.5)
WBC: 33.1 10*3/uL — ABNORMAL HIGH (ref 4.0–10.5)
WBC: 30.5 10*3/uL — ABNORMAL HIGH (ref 4.0–10.5)

## 2014-12-13 MED ORDER — VANCOMYCIN HCL IN DEXTROSE 750-5 MG/150ML-% IV SOLN
750.0000 mg | Freq: Once | INTRAVENOUS | Status: AC
  Administered 2014-12-13: 750 mg via INTRAVENOUS
  Filled 2014-12-13: qty 150

## 2014-12-13 MED ORDER — METOPROLOL TARTRATE 12.5 MG HALF TABLET
12.5000 mg | ORAL_TABLET | Freq: Two times a day (BID) | ORAL | Status: DC
  Filled 2014-12-13 (×2): qty 1

## 2014-12-13 MED ORDER — OXYCODONE HCL 5 MG PO TABS
5.0000 mg | ORAL_TABLET | ORAL | Status: DC | PRN
  Administered 2014-12-15 – 2014-12-17 (×2): 15 mg via ORAL
  Administered 2014-12-18: 10 mg via ORAL
  Administered 2014-12-18 – 2014-12-19 (×3): 15 mg via ORAL
  Filled 2014-12-13 (×5): qty 3

## 2014-12-13 MED ORDER — ONDANSETRON HCL 4 MG/2ML IJ SOLN
INTRAMUSCULAR | Status: AC
  Filled 2014-12-13: qty 2

## 2014-12-13 MED ORDER — PANTOPRAZOLE SODIUM 40 MG PO TBEC
40.0000 mg | DELAYED_RELEASE_TABLET | Freq: Every day | ORAL | Status: DC
  Administered 2014-12-13 – 2014-12-15 (×3): 40 mg via ORAL
  Filled 2014-12-13 (×2): qty 1

## 2014-12-13 MED ORDER — LANTHANUM CARBONATE 500 MG PO CHEW
2000.0000 mg | CHEWABLE_TABLET | Freq: Three times a day (TID) | ORAL | Status: DC
  Administered 2014-12-14 – 2014-12-27 (×34): 2000 mg via ORAL
  Filled 2014-12-13 (×36): qty 4

## 2014-12-13 MED ORDER — METOPROLOL TARTRATE 12.5 MG HALF TABLET
12.5000 mg | ORAL_TABLET | Freq: Two times a day (BID) | ORAL | Status: DC
  Administered 2014-12-13 – 2014-12-15 (×5): 12.5 mg via ORAL
  Filled 2014-12-13 (×8): qty 1

## 2014-12-13 NOTE — Progress Notes (Signed)
OT Cancellation Note  Patient Details Name: Caleb [name] MRN: [id number] DOB: 02-18-1991   Cancelled Treatment:    Reason Eval/Treat Not Completed: Patient at procedure or test/ unavailable-per nurse, pt at dialysis.  Benito Mccreedy OTR/L [phone] 12/13/2014, 2:04 PM
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
## 2014-12-13 NOTE — Progress Notes (Signed)
Rehab admissions - I am following this patient for potential acute inpatient rehab admission.  Currently patient is in HD.  I will check back in am to discuss rehab options with patient.  Call me for questions.  #[phone]
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
## 2014-12-13 NOTE — Procedures (Signed)
I have personally attended this patient's dialysis session.   4K bath, no volume off, tight heparin. Tolerating treatment well. R IJ TDC  Jamal Maes, MD [facility] [phone] Pager 12/13/2014, 5:45 PM
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
## 2014-12-13 NOTE — Progress Notes (Signed)
SLP Cancellation Note  Patient Details Name: Caleb [name] MRN: [id number] DOB: 1990/08/11   Cancelled treatment:       Reason Eval/Treat Not Completed: Medical issues which prohibited therapy. Pt complaining of nausea. Requested SLP return another time for cognitive linguistic eval.    DeBlois, Katherene Ponto 12/13/2014, 11:19 AM
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
## 2014-12-13 NOTE — Progress Notes (Signed)
ANTIBIOTIC CONSULT NOTE - FOLLOW UP  Pharmacy Consult for Vancomycin and Zosyn Indication: necrotic muscle  Allergies  Allergen Reactions  . Other Itching and Rash    Tape or gauze or plastic wound dressings.      Patient Measurements: Height: 5\' 9"  (175.3 cm) Weight: 168 lb 10.4 oz (76.5 kg) IBW/kg (Calculated) : 70.7 Adjusted Body Weight:   Vital Signs: Temp: 99.2 F (37.3 C) (09/20 1333) Temp Source: Oral (09/20 1333) BP: 158/62 mmHg (09/20 1500) Pulse Rate: 102 (09/20 1500) Intake/Output from previous day: 09/19 0701 - 09/20 0700 In: 738.5 [P.O.:360; I.V.:278.5; IV Piggyback:100] Out: 490 [Urine:160] Intake/Output from this shift: Total I/O In: 370 [P.O.:360; I.V.:10] Out: -   Labs:  Recent Labs  12/12/14 0444 12/12/14 0839 12/13/14 0425 12/13/14 1001 12/13/14 1515  WBC  --  35.5* 33.1*  --  30.5*  HGB  --  10.3* 8.0*  --  8.1*  PLT  --  403* 412*  --  462*  CREATININE 3.59*  --  6.72* 8.19*  --    Estimated Creatinine Clearance: 14 mL/min (by C-G formula based on Cr of 8.19). No results for input(s): VANCOTROUGH, VANCOPEAK, VANCORANDOM, GENTTROUGH, GENTPEAK, GENTRANDOM, TOBRATROUGH, TOBRAPEAK, TOBRARND, AMIKACINPEAK, AMIKACINTROU, AMIKACIN in the last 72 hours.   Microbiology: Recent Results (from the past 720 hour(s))  MRSA PCR Screening     Status: None   Collection Time: 11/25/14 10:00 AM  Result Value Ref Range Status   MRSA by PCR NEGATIVE NEGATIVE Final    Comment:        The GeneXpert MRSA Assay (FDA approved for NASAL specimens only), is one component of a comprehensive MRSA colonization surveillance program. It is not intended to diagnose MRSA infection nor to guide or monitor treatment for MRSA infections.   Culture, blood (routine x 2)     Status: None (Preliminary result)   Collection Time: 12/10/14  1:00 PM  Result Value Ref Range Status   Specimen Description BLOOD BLOOD RIGHT FOREARM  Final   Special Requests BOTTLES DRAWN  AEROBIC ONLY 5CC  Final   Culture NO GROWTH 3 DAYS  Final   Report Status PENDING  Incomplete  Culture, blood (routine x 2)     Status: None (Preliminary result)   Collection Time: 12/10/14  1:06 PM  Result Value Ref Range Status   Specimen Description BLOOD BLOOD RIGHT HAND  Final   Special Requests BOTTLES DRAWN AEROBIC ONLY 5CC  Final   Culture NO GROWTH 3 DAYS  Final   Report Status PENDING  Incomplete    Anti-infectives    Start     Dose/Rate Route Frequency Ordered Stop   12/13/14 1615  vancomycin (VANCOCIN) IVPB 750 mg/150 ml premix     750 mg 150 mL/hr over 60 Minutes Intravenous  Once 12/13/14 1613     12/12/14 1600  piperacillin-tazobactam (ZOSYN) IVPB 2.25 g     2.25 g 100 mL/hr over 30 Minutes Intravenous 3 times per day 12/12/14 1318     12/12/14 0600  vancomycin (VANCOCIN) IVPB 750 mg/150 ml premix  Status:  Discontinued     750 mg 150 mL/hr over 60 Minutes Intravenous Every 24 hours 12/11/14 0622 12/12/14 1318   12/11/14 0700  vancomycin (VANCOCIN) 1,250 mg in sodium chloride 0.9 % 250 mL IVPB     1,250 mg 166.7 mL/hr over 90 Minutes Intravenous  Once 12/11/14 0622 12/11/14 0820   12/07/14 1500  piperacillin-tazobactam (ZOSYN) IVPB 3.375 g  Status:  Discontinued  3.375 g 12.5 mL/hr over 240 Minutes Intravenous Every 6 hours 12/07/14 0923 12/07/14 0925   12/07/14 1500  piperacillin-tazobactam (ZOSYN) IVPB 3.375 g  Status:  Discontinued     3.375 g 100 mL/hr over 30 Minutes Intravenous Every 6 hours 12/07/14 0925 12/12/14 1318   12/06/14 0900  piperacillin-tazobactam (ZOSYN) IVPB 2.25 g  Status:  Discontinued     2.25 g 100 mL/hr over 30 Minutes Intravenous Every 8 hours 12/06/14 0836 12/07/14 6378      Assessment: 24yo male who has been on Vancomycin and Zosyn, dosed for CRRT.  Pt is now changing to intermittent HD and will require dosage adjustment.  CRRT stopping today and to be assessed daily for HD.  Zosyn will be adjusted and Vanc will be ordered as HD  sessions are ordered.  HD today 9/20 --> Vancomycin 750 mg iv x 1 dose tonight  Goal of Therapy:  pre-HD Vanc level 15-25  Plan:  Continue  Zosyn to 2.25g IV q 8 hours Vancomycin 750 mg iv x 1 after HD today  Thank you Anette Guarneri, PharmD [phone]
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
## 2014-12-14 LAB — C DIFFICILE QUICK SCREEN W PCR REFLEX
C Diff antigen: NEGATIVE
C Diff interpretation: NEGATIVE
C Diff toxin: NEGATIVE

## 2014-12-14 LAB — CBC
HCT: 22 % — ABNORMAL LOW (ref 39.0–52.0)
HEMOGLOBIN: 7.5 g/dL — AB (ref 13.0–17.0)
MCH: 29.2 pg (ref 26.0–34.0)
MCHC: 34.1 g/dL (ref 30.0–36.0)
MCV: 85.6 fL (ref 78.0–100.0)
Platelets: 451 10*3/uL — ABNORMAL HIGH (ref 150–400)
RBC: 2.57 MIL/uL — AB (ref 4.22–5.81)
RDW: 14 % (ref 11.5–15.5)
WBC: 21.9 10*3/uL — ABNORMAL HIGH (ref 4.0–10.5)

## 2014-12-14 LAB — CK: Total CK: 2098 U/L — ABNORMAL HIGH (ref 49–397)

## 2014-12-14 LAB — RENAL FUNCTION PANEL
ALBUMIN: 2.6 g/dL — AB (ref 3.5–5.0)
ANION GAP: 13 (ref 5–15)
BUN: 32 mg/dL — ABNORMAL HIGH (ref 6–20)
CALCIUM: 8.8 mg/dL — AB (ref 8.9–10.3)
CO2: 24 mmol/L (ref 22–32)
Chloride: 95 mmol/L — ABNORMAL LOW (ref 101–111)
Creatinine, Ser: 5.65 mg/dL — ABNORMAL HIGH (ref 0.61–1.24)
GFR calc non Af Amer: 13 mL/min — ABNORMAL LOW (ref >60)
GFR, EST AFRICAN AMERICAN: 15 mL/min — AB (ref >60)
Glucose, Bld: 110 mg/dL — ABNORMAL HIGH (ref 65–99)
PHOSPHORUS: 7.2 mg/dL — AB (ref 2.5–4.6)
Potassium: 3.3 mmol/L — ABNORMAL LOW (ref 3.5–5.1)
SODIUM: 132 mmol/L — AB (ref 135–145)

## 2014-12-14 LAB — GLUCOSE, CAPILLARY
Glucose-Capillary: 107 mg/dL — ABNORMAL HIGH (ref 65–99)
Glucose-Capillary: 109 mg/dL — ABNORMAL HIGH (ref 65–99)

## 2014-12-14 MED ORDER — SODIUM CHLORIDE 0.9 % IV SOLN
INTRAVENOUS | Status: DC
  Administered 2014-12-14: 50 mL via INTRAVENOUS

## 2014-12-14 MED ORDER — METOCLOPRAMIDE HCL 5 MG/ML IJ SOLN
5.0000 mg | Freq: Four times a day (QID) | INTRAMUSCULAR | Status: DC
  Administered 2014-12-14 – 2014-12-16 (×5): 5 mg via INTRAVENOUS
  Filled 2014-12-14 (×2): qty 1
  Filled 2014-12-14 (×4): qty 2
  Filled 2014-12-14 (×2): qty 1

## 2014-12-14 MED ORDER — PROMETHAZINE HCL 25 MG/ML IJ SOLN
12.5000 mg | Freq: Three times a day (TID) | INTRAMUSCULAR | Status: DC | PRN
  Administered 2014-12-14 (×2): 12.5 mg via INTRAVENOUS
  Filled 2014-12-14 (×2): qty 1
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
## 2014-12-14 NOTE — Progress Notes (Signed)
Pt. Continues to have projectile vomiting; Dr. Hulen Skains notified, new orders received. VSS will continue to monitor. Warden Fillers RN CCRN
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
## 2014-12-14 NOTE — Progress Notes (Signed)
Trauma Service Note  Subjective: Patient doing well.  Says he feels thirsty all the time.  Objective: Vital signs in last 24 hours: Temp:  [98.6 F (37 C)-100 F (37.8 C)] 98.8 F (37.1 C) (09/21 0737) Pulse Rate:  [65-112] 91 (09/21 0614) Resp:  [11-30] 18 (09/21 0614) BP: (111-158)/(52-81) 118/56 mmHg (09/21 0400) SpO2:  [100 %] 100 % (09/21 0614) Weight:  [75.2 kg (165 lb 12.6 oz)-76.5 kg (168 lb 10.4 oz)] 75.2 kg (165 lb 12.6 oz) (09/21 0614) Last BM Date: 12/13/14  Intake/Output from previous day: 09/20 0701 - 09/21 0700 In: 420 [P.O.:360; I.V.:10; IV Piggyback:50] Out: 250 [Urine:100; Stool:150] Intake/Output this shift:    General: No acute distress.    Lungs: Clear  Abd: Soft, good/great bowel sounds.  Off all laxatives.  Extremities: Says right leg has tingling.  Right groin staples intact.  Left AKA is fine.  Neuro: Intact.  Lab Results: CBC   Recent Labs  12/13/14 1515 12/14/14 0315  WBC 30.5* 21.9*  HGB 8.1* 7.5*  HCT 23.2* 22.0*  PLT 462* 451*   BMET  Recent Labs  12/13/14 1515 12/14/14 0315  NA 125* 132*  K 3.2* 3.3*  CL 85* 95*  CO2 19* 24  GLUCOSE 116* 110*  BUN 76* 32*  CREATININE 8.43* 5.65*  CALCIUM 9.1 8.8*   PT/INR No results for input(s): LABPROT, INR in the last 72 hours. ABG No results for input(s): PHART, HCO3 in the last 72 hours.  Invalid input(s): PCO2, PO2  Studies/Results: No results found.  Anti-infectives: Anti-infectives    Start     Dose/Rate Route Frequency Ordered Stop   12/13/14 1615  vancomycin (VANCOCIN) IVPB 750 mg/150 ml premix     750 mg 150 mL/hr over 60 Minutes Intravenous  Once 12/13/14 1613 12/13/14 1715   12/12/14 1600  piperacillin-tazobactam (ZOSYN) IVPB 2.25 g     2.25 g 100 mL/hr over 30 Minutes Intravenous 3 times per day 12/12/14 1318     12/12/14 0600  vancomycin (VANCOCIN) IVPB 750 mg/150 ml premix  Status:  Discontinued     750 mg 150 mL/hr over 60 Minutes Intravenous Every 24  hours 12/11/14 0622 12/12/14 1318   12/11/14 0700  vancomycin (VANCOCIN) 1,250 mg in sodium chloride 0.9 % 250 mL IVPB     1,250 mg 166.7 mL/hr over 90 Minutes Intravenous  Once 12/11/14 0622 12/11/14 0820   12/07/14 1500  piperacillin-tazobactam (ZOSYN) IVPB 3.375 g  Status:  Discontinued     3.375 g 12.5 mL/hr over 240 Minutes Intravenous Every 6 hours 12/07/14 0923 12/07/14 0925   12/07/14 1500  piperacillin-tazobactam (ZOSYN) IVPB 3.375 g  Status:  Discontinued     3.375 g 100 mL/hr over 30 Minutes Intravenous Every 6 hours 12/07/14 0925 12/12/14 1318   12/06/14 0900  piperacillin-tazobactam (ZOSYN) IVPB 2.25 g  Status:  Discontinued     2.25 g 100 mL/hr over 30 Minutes Intravenous Every 8 hours 12/06/14 0836 12/07/14 0923      Assessment/Plan: s/p Procedure(s): AMPUTATION ABOVE KNEE AMPUTATION Transfer to the floor.  Not sure if he needs more fluid.  Certainly does not seem fluid overloaded.  Nausea is controlled, but still having diarrhea.  Will check C.diff not that he is off all laxatives.  LOS: 19 days   Kathryne Eriksson. Dahlia Bailiff, MD, FACS [phone] Trauma Surgeon 12/14/2014
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
## 2014-12-14 NOTE — Progress Notes (Signed)
Rehab admissions - I met with patient and his family today.  I explained inpatient rehab and gave patient brochures.  Patient is RRT dependent since 12/04/14.  He is not voiding except a little every couple of days.  I will follow along for rehab needs.  Given ongoing renal needs, I cannot offer a bed on inpatient rehab at this time.  Call me for questions.  #[phone]

## 2014-12-15 LAB — RENAL FUNCTION PANEL
Albumin: 2.4 g/dL — ABNORMAL LOW (ref 3.5–5.0)
Anion gap: 15 (ref 5–15)
BUN: 52 mg/dL — ABNORMAL HIGH (ref 6–20)
CALCIUM: 8.8 mg/dL — AB (ref 8.9–10.3)
CO2: 21 mmol/L — AB (ref 22–32)
CREATININE: 9.21 mg/dL — AB (ref 0.61–1.24)
Chloride: 90 mmol/L — ABNORMAL LOW (ref 101–111)
GFR calc non Af Amer: 7 mL/min — ABNORMAL LOW (ref >60)
GFR, EST AFRICAN AMERICAN: 8 mL/min — AB (ref >60)
GLUCOSE: 103 mg/dL — AB (ref 65–99)
Phosphorus: 10.5 mg/dL — ABNORMAL HIGH (ref 2.5–4.6)
Potassium: 3.3 mmol/L — ABNORMAL LOW (ref 3.5–5.1)
SODIUM: 126 mmol/L — AB (ref 135–145)

## 2014-12-15 LAB — CULTURE, BLOOD (ROUTINE X 2)
Culture: NO GROWTH
Culture: NO GROWTH

## 2014-12-15 LAB — CBC WITH DIFFERENTIAL/PLATELET
BASOS PCT: 1 %
Basophils Absolute: 0.2 10*3/uL — ABNORMAL HIGH (ref 0.0–0.1)
EOS ABS: 0.3 10*3/uL (ref 0.0–0.7)
Eosinophils Relative: 2 %
HCT: 20.7 % — ABNORMAL LOW (ref 39.0–52.0)
Hemoglobin: 7 g/dL — ABNORMAL LOW (ref 13.0–17.0)
Lymphocytes Relative: 9 %
Lymphs Abs: 1.5 10*3/uL (ref 0.7–4.0)
MCH: 28.9 pg (ref 26.0–34.0)
MCHC: 33.8 g/dL (ref 30.0–36.0)
MCV: 85.5 fL (ref 78.0–100.0)
MONO ABS: 1.4 10*3/uL — AB (ref 0.1–1.0)
MONOS PCT: 8 %
Neutro Abs: 13.8 10*3/uL — ABNORMAL HIGH (ref 1.7–7.7)
Neutrophils Relative %: 80 %
Platelets: 482 10*3/uL — ABNORMAL HIGH (ref 150–400)
RBC: 2.42 MIL/uL — ABNORMAL LOW (ref 4.22–5.81)
RDW: 13.8 % (ref 11.5–15.5)
WBC: 17.1 10*3/uL — ABNORMAL HIGH (ref 4.0–10.5)

## 2014-12-15 LAB — IRON AND TIBC
IRON: 28 ug/dL — AB (ref 45–182)
Saturation Ratios: 11 % — ABNORMAL LOW (ref 17.9–39.5)
TIBC: 255 ug/dL (ref 250–450)
UIBC: 227 ug/dL

## 2014-12-15 LAB — VANCOMYCIN, TROUGH: VANCOMYCIN TR: 28 ug/mL — AB (ref 10.0–20.0)

## 2014-12-15 LAB — FERRITIN: Ferritin: 462 ng/mL — ABNORMAL HIGH (ref 24–336)

## 2014-12-15 MED ORDER — DARBEPOETIN ALFA 150 MCG/0.3ML IJ SOSY
150.0000 ug | PREFILLED_SYRINGE | INTRAMUSCULAR | Status: DC
  Administered 2014-12-15: 150 ug via INTRAVENOUS
  Filled 2014-12-15: qty 0.3

## 2014-12-15 MED ORDER — OXYCODONE HCL 5 MG PO TABS
ORAL_TABLET | ORAL | Status: AC
  Filled 2014-12-15: qty 3

## 2014-12-15 MED ORDER — DARBEPOETIN ALFA 150 MCG/0.3ML IJ SOSY
PREFILLED_SYRINGE | INTRAMUSCULAR | Status: AC
  Filled 2014-12-15: qty 0.3

## 2014-12-15 MED ORDER — VANCOMYCIN HCL IN DEXTROSE 750-5 MG/150ML-% IV SOLN
750.0000 mg | INTRAVENOUS | Status: AC
  Administered 2014-12-15: 750 mg via INTRAVENOUS
  Filled 2014-12-15 (×2): qty 150
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
## 2014-12-15 NOTE — Progress Notes (Signed)
Order received for [facility] consult.  Referrals to both [facility] and [facility] for review.    Will follow up.  Reinaldo Raddle, RN, BSN  Trauma/Neuro ICU Case Manager [phone]

## 2014-12-15 NOTE — Procedures (Signed)
I have personally attended this patient's dialysis session.  4K bath, no volume off R IJ TDC running without difficulty   Jamal Maes, MD [facility] [phone] Pager 12/15/2014, 11:52 AM

## 2014-12-15 NOTE — Clinical Documentation Improvement (Signed)
Trauma  Abnormal Lab/Test Results:  12/15/14 Na= 126; 12/14/14 Na= 132; 12/13/14 Na= 125, 122, 124  Possible Clinical Conditions associated with below indicators  Hyponatremia  Other Condition  Cannot Clinically Determine  Please exercise your independent, professional judgment when responding. A specific answer is not anticipated or expected.  Thank You,  Rolm Gala, RN, [place] [phone]

## 2014-12-15 NOTE — Progress Notes (Signed)
ANTIBIOTIC CONSULT NOTE - FOLLOW UP  Pharmacy Consult for Vancomycin and Zosyn Indication: necrotic muscle  Allergies  Allergen Reactions  . Other Itching and Rash    Tape or gauze or plastic wound dressings.      Patient Measurements: Height: 5\' 9"  (175.3 cm) Weight: 171 lb 15.3 oz (78 kg) IBW/kg (Calculated) : 70.7 Adjusted Body Weight:   Vital Signs: Temp: 98 F (36.7 C) (09/22 1106) Temp Source: Oral (09/22 1106) BP: 126/79 mmHg (09/22 1330) Pulse Rate: 85 (09/22 1330) Intake/Output from previous day: 09/21 0701 - 09/22 0700 In: 1677.5 [P.O.:840; I.V.:687.5; IV Piggyback:150] Out: -  Intake/Output from this shift: Total I/O In: 170 [P.O.:120; I.V.:50] Out: 100 [Urine:100]  Labs:  Recent Labs  12/13/14 1515 12/14/14 0315 12/15/14 0247  WBC 30.5* 21.9* 17.1*  HGB 8.1* 7.5* 7.0*  PLT 462* 451* 482*  CREATININE 8.43* 5.65* 9.21*   Estimated Creatinine Clearance: 12.5 mL/min (by C-G formula based on Cr of 9.21).  Recent Labs  12/15/14 1126  [place] 28*     Microbiology: Recent Results (from the past 720 hour(s))  MRSA PCR Screening     Status: None   Collection Time: 11/25/14 10:00 AM  Result Value Ref Range Status   MRSA by PCR NEGATIVE NEGATIVE Final    Comment:        The GeneXpert MRSA Assay (FDA approved for NASAL specimens only), is one component of a comprehensive MRSA colonization surveillance program. It is not intended to diagnose MRSA infection nor to guide or monitor treatment for MRSA infections.   Culture, blood (routine x 2)     Status: None (Preliminary result)   Collection Time: 12/10/14  1:00 PM  Result Value Ref Range Status   Specimen Description BLOOD BLOOD RIGHT FOREARM  Final   Special Requests BOTTLES DRAWN AEROBIC ONLY 5CC  Final   Culture NO GROWTH 4 DAYS  Final   Report Status PENDING  Incomplete  Culture, blood (routine x 2)     Status: None (Preliminary result)   Collection Time: 12/10/14  1:06 PM  Result  Value Ref Range Status   Specimen Description BLOOD BLOOD RIGHT HAND  Final   Special Requests BOTTLES DRAWN AEROBIC ONLY 5CC  Final   Culture NO GROWTH 4 DAYS  Final   Report Status PENDING  Incomplete  C difficile quick scan w PCR reflex     Status: None   Collection Time: 12/14/14 10:34 AM  Result Value Ref Range Status   C Diff antigen NEGATIVE NEGATIVE Final   C Diff toxin NEGATIVE NEGATIVE Final   C Diff interpretation Negative for toxigenic C. difficile  Final    Anti-infectives    Start     Dose/Rate Route Frequency Ordered Stop   12/13/14 1615  vancomycin (VANCOCIN) IVPB 750 mg/150 ml premix     750 mg 150 mL/hr over 60 Minutes Intravenous  Once 12/13/14 1613 12/13/14 1715   12/12/14 1600  piperacillin-tazobactam (ZOSYN) IVPB 2.25 g     2.25 g 100 mL/hr over 30 Minutes Intravenous 3 times per day 12/12/14 1318     12/12/14 0600  vancomycin (VANCOCIN) IVPB 750 mg/150 ml premix  Status:  Discontinued     750 mg 150 mL/hr over 60 Minutes Intravenous Every 24 hours 12/11/14 0622 12/12/14 1318   12/11/14 0700  vancomycin (VANCOCIN) 1,250 mg in sodium chloride 0.9 % 250 mL IVPB     1,250 mg 166.7 mL/hr over 90 Minutes Intravenous  Once 12/11/14 0622 12/11/14  0820   12/07/14 1500  piperacillin-tazobactam (ZOSYN) IVPB 3.375 g  Status:  Discontinued     3.375 g 12.5 mL/hr over 240 Minutes Intravenous Every 6 hours 12/07/14 0923 12/07/14 0925   12/07/14 1500  piperacillin-tazobactam (ZOSYN) IVPB 3.375 g  Status:  Discontinued     3.375 g 100 mL/hr over 30 Minutes Intravenous Every 6 hours 12/07/14 0925 12/12/14 1318   12/06/14 0900  piperacillin-tazobactam (ZOSYN) IVPB 2.25 g  Status:  Discontinued     2.25 g 100 mL/hr over 30 Minutes Intravenous Every 8 hours 12/06/14 0836 12/07/14 4462      Assessment: 24yo male who has been on Vancomycin and Zosyn for necrotic muscle.  He had CRRT from 9/13-9/19 and now is on iHD. Pre HD vancomycin level is 28 today. He is scheduled to have  a full dialysis treatment. Tmax 99.4, WBC are elevated.  Zosyn 9/13>>  Vanc 9/18>>   9/17 blood x 2 - ntd  9/2 MRSA screen neg  Goal of Therapy:  pre-HD Vanc level 15-25  Plan:  Continue Zosyn 2.25 g IV q8h Vancomycin 750 mg IV after HD today F/u HD schedule  [facility], [place].D., BCPS Clinical Pharmacist Pager: [phone] 12/15/2014 1:59 PM
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
## 2014-12-16 DIAGNOSIS — S31139A Puncture wound of abdominal wall without foreign body, unspecified quadrant without penetration into peritoneal cavity, initial encounter: Secondary | ICD-10-CM

## 2014-12-16 DIAGNOSIS — T79A0XA Compartment syndrome, unspecified, initial encounter: Secondary | ICD-10-CM | POA: Diagnosis not present

## 2014-12-16 DIAGNOSIS — S75102A Unspecified injury of femoral vein at hip and thigh level, left leg, initial encounter: Secondary | ICD-10-CM | POA: Diagnosis present

## 2014-12-16 DIAGNOSIS — N179 Acute kidney failure, unspecified: Secondary | ICD-10-CM | POA: Diagnosis not present

## 2014-12-16 DIAGNOSIS — S75009A Unspecified injury of femoral artery, unspecified leg, initial encounter: Secondary | ICD-10-CM | POA: Diagnosis present

## 2014-12-16 DIAGNOSIS — W3400XA Accidental discharge from unspecified firearms or gun, initial encounter: Secondary | ICD-10-CM

## 2014-12-16 LAB — RENAL FUNCTION PANEL
Albumin: 2.2 g/dL — ABNORMAL LOW (ref 3.5–5.0)
Anion gap: 10 (ref 5–15)
BUN: 30 mg/dL — AB (ref 6–20)
CALCIUM: 8.3 mg/dL — AB (ref 8.9–10.3)
CO2: 25 mmol/L (ref 22–32)
CREATININE: 6.91 mg/dL — AB (ref 0.61–1.24)
Chloride: 92 mmol/L — ABNORMAL LOW (ref 101–111)
GFR, EST AFRICAN AMERICAN: 12 mL/min — AB (ref >60)
GFR, EST NON AFRICAN AMERICAN: 10 mL/min — AB (ref >60)
Glucose, Bld: 98 mg/dL (ref 65–99)
Phosphorus: 6.2 mg/dL — ABNORMAL HIGH (ref 2.5–4.6)
Potassium: 3.8 mmol/L (ref 3.5–5.1)
SODIUM: 127 mmol/L — AB (ref 135–145)

## 2014-12-16 LAB — CBC
HCT: 19.9 % — ABNORMAL LOW (ref 39.0–52.0)
Hemoglobin: 6.8 g/dL — CL (ref 13.0–17.0)
MCH: 29.7 pg (ref 26.0–34.0)
MCHC: 34.2 g/dL (ref 30.0–36.0)
MCV: 86.9 fL (ref 78.0–100.0)
PLATELETS: 511 10*3/uL — AB (ref 150–400)
RBC: 2.29 MIL/uL — AB (ref 4.22–5.81)
RDW: 14 % (ref 11.5–15.5)
WBC: 18 10*3/uL — ABNORMAL HIGH (ref 4.0–10.5)

## 2014-12-16 LAB — PREPARE RBC (CROSSMATCH)

## 2014-12-16 MED ORDER — METOCLOPRAMIDE HCL 5 MG/ML IJ SOLN
5.0000 mg | Freq: Three times a day (TID) | INTRAMUSCULAR | Status: DC
  Administered 2014-12-16 – 2014-12-22 (×18): 5 mg via INTRAVENOUS
  Filled 2014-12-16 (×15): qty 2

## 2014-12-16 MED ORDER — SODIUM CHLORIDE 0.9 % IV SOLN
125.0000 mg | INTRAVENOUS | Status: DC
  Administered 2014-12-17: 125 mg via INTRAVENOUS
  Filled 2014-12-16 (×2): qty 10

## 2014-12-16 MED ORDER — SODIUM CHLORIDE 0.9 % IV SOLN
Freq: Once | INTRAVENOUS | Status: AC
  Administered 2014-12-16: 15:00:00 via INTRAVENOUS
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
## 2014-12-16 NOTE — Progress Notes (Signed)
Notified Dr. Donne Hazel of patient's Hgb of 6.8 this am. Patient will be seen in rounds.

## 2014-12-16 NOTE — Progress Notes (Signed)
OT Cancellation Note  Patient Details Name: Caleb [name] MRN: [id number] DOB: May 04, 1990   Cancelled Treatment:    Reason Eval/Treat Not Completed: Patient not medically ready (hgb 6.8 pending transfusion)  Peri Maris  I agree with the following treatment note after reviewing documentation.   Jeri Modena OTR/L Pager: [phone] Office: [phone] .    12/16/2014, 12:58 PM

## 2014-12-16 NOTE — Progress Notes (Signed)
Per [facility] admissions liaison, pt not appropriate for admission to their facility.    [facility] admissions liaison states pt appropriate for admission, pending insurance authorization.  He does have insurance benefits that would cover 100% of LTAC at [facility] after authorization.  Pt/family would have to agree to admission.    MD/PA may want to discuss with family prior to discussion with case manager.  Will follow up on Monday.    Reinaldo Raddle, RN, BSN  Trauma/Neuro ICU Case Manager [phone]
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
## 2014-12-16 NOTE — Progress Notes (Signed)
Called to patient room  Stating bleeding to stump site. Moderate bleeding noted but eventually stop , dressing change and will conitnue to monitor.PA made aware.

## 2014-12-16 NOTE — Progress Notes (Signed)
Orthopedic Tech Progress Note Patient Details:  FORTUNATO NORDIN 10-29-1990 [id number]  Patient ID: [name], male   DOB: 1990/09/18, 24 y.o.   MRN: [id number] Called in bio-tech replacement brace order due to current brace being soiled; spoke with Dolores Lory, [place] 12/16/2014, 9:45 AM
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
## 2014-12-17 LAB — CBC
HCT: 23 % — ABNORMAL LOW (ref 39.0–52.0)
Hemoglobin: 8 g/dL — ABNORMAL LOW (ref 13.0–17.0)
MCH: 29.7 pg (ref 26.0–34.0)
MCHC: 34.8 g/dL (ref 30.0–36.0)
MCV: 85.5 fL (ref 78.0–100.0)
PLATELETS: 555 10*3/uL — AB (ref 150–400)
RBC: 2.69 MIL/uL — AB (ref 4.22–5.81)
RDW: 14.4 % (ref 11.5–15.5)
WBC: 15.7 10*3/uL — ABNORMAL HIGH (ref 4.0–10.5)

## 2014-12-17 LAB — RENAL FUNCTION PANEL
Albumin: 2.3 g/dL — ABNORMAL LOW (ref 3.5–5.0)
Anion gap: 15 (ref 5–15)
BUN: 53 mg/dL — ABNORMAL HIGH (ref 6–20)
CHLORIDE: 93 mmol/L — AB (ref 101–111)
CO2: 20 mmol/L — ABNORMAL LOW (ref 22–32)
CREATININE: 10.12 mg/dL — AB (ref 0.61–1.24)
Calcium: 9.1 mg/dL (ref 8.9–10.3)
GFR, EST AFRICAN AMERICAN: 7 mL/min — AB (ref >60)
GFR, EST NON AFRICAN AMERICAN: 6 mL/min — AB (ref >60)
Glucose, Bld: 92 mg/dL (ref 65–99)
POTASSIUM: 3.6 mmol/L (ref 3.5–5.1)
Phosphorus: 8.7 mg/dL — ABNORMAL HIGH (ref 2.5–4.6)
Sodium: 128 mmol/L — ABNORMAL LOW (ref 135–145)

## 2014-12-17 NOTE — Procedures (Signed)
I have personally attended this patient's dialysis session.   He has just arrived in the dialysis unit Planning a 4 hour treatment today  Will change to a 3K bath given K of 3.6 (asnd 2.25 Ca) Using tight heparin  Jamal Maes, MD [place] Pager 12/17/2014, 7:29 AM

## 2014-12-17 NOTE — Progress Notes (Signed)
PT Cancellation Note  Patient Details Name: Caleb [name] MRN: [id number] DOB: [birth year]   Cancelled Treatment:    Reason Eval/Treat Not Completed: Patient at procedure or test/unavailable.  Patient in HD this am.  Will return at later date for PT session.   Despina Pole 12/17/2014, 11:37 AM Carita Pian. Sanjuana Kava, [place] Pager [phone]
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
## 2014-12-17 NOTE — Discharge Planning (Signed)
Temp elevated, MD notified, orders for BC x 2.
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
## 2014-12-17 NOTE — Progress Notes (Signed)
Pt has a witnessed fall in the bathroom while trying to stand up from the commode his leg buckled up.VSS. No apparent injury noted. Dr Barry Dienes notified. Family is present in the room. Safety zone portal done. Will continue safety plan, and monitor pt.

## 2014-12-18 ENCOUNTER — Encounter (HOSPITAL_COMMUNITY): Payer: 59

## 2014-12-18 ENCOUNTER — Inpatient Hospital Stay (HOSPITAL_COMMUNITY): Payer: 59

## 2014-12-18 DIAGNOSIS — T82898A Other specified complication of vascular prosthetic devices, implants and grafts, initial encounter: Secondary | ICD-10-CM

## 2014-12-18 LAB — RENAL FUNCTION PANEL
ANION GAP: 10 (ref 5–15)
Albumin: 2.2 g/dL — ABNORMAL LOW (ref 3.5–5.0)
BUN: 28 mg/dL — ABNORMAL HIGH (ref 6–20)
CHLORIDE: 90 mmol/L — AB (ref 101–111)
CO2: 26 mmol/L (ref 22–32)
Calcium: 8.5 mg/dL — ABNORMAL LOW (ref 8.9–10.3)
Creatinine, Ser: 6.68 mg/dL — ABNORMAL HIGH (ref 0.61–1.24)
GFR calc non Af Amer: 11 mL/min — ABNORMAL LOW (ref >60)
GFR, EST AFRICAN AMERICAN: 12 mL/min — AB (ref >60)
GLUCOSE: 101 mg/dL — AB (ref 65–99)
POTASSIUM: 3.5 mmol/L (ref 3.5–5.1)
Phosphorus: 6 mg/dL — ABNORMAL HIGH (ref 2.5–4.6)
SODIUM: 126 mmol/L — AB (ref 135–145)

## 2014-12-18 MED ORDER — PIPERACILLIN-TAZOBACTAM IN DEX 2-0.25 GM/50ML IV SOLN
2.2500 g | Freq: Three times a day (TID) | INTRAVENOUS | Status: DC
  Administered 2014-12-18 – 2014-12-20 (×7): 2.25 g via INTRAVENOUS
  Filled 2014-12-18 (×9): qty 50
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
## 2014-12-18 NOTE — Progress Notes (Signed)
[facility] Catheter Removal Procedure Note  Diagnosis: ESRD   Plan:  Remove right diatek catheter  Consent signed:  yes Time out completed:  yes Coumadin:  No. PT/INR (if applicable):   Other labs:   Procedure: 1.  Sterile prepping and draping over catheter area 2. 0 ml 2% lidocaine plain instilled at removal site. 3.  right catheter removed in its entirety with cuff in tact. 4.  Complications: none  5. Tip of catheter sent for culture:  Yes.     Patient tolerated procedure well:  yes Pressure held, no bleeding noted, dressing applied Instructions given to the pt regarding wound care and bleeding.  OtherLaurence Slate [facility] 12/18/2014 9:06 AM  Plan new HD catheter in 48 hours
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
## 2014-12-18 NOTE — Progress Notes (Signed)
10 Days Post-Op  Subjective: No complaints. Fever overnight. Blood cultures showed ecoli  Objective: Vital signs in last 24 hours: Temp:  [98.4 F (36.9 C)-102.5 F (39.2 C)] 98.4 F (36.9 C) (09/25 0500) Pulse Rate:  [91-111] 91 (09/25 0500) Resp:  [14-18] 16 (09/25 0500) BP: (122-135)/(75-98) 131/75 mmHg (09/25 0500) SpO2:  [95 %-100 %] 100 % (09/25 0500) Weight:  [77.8 kg (171 lb 8.3 oz)] 77.8 kg (171 lb 8.3 oz) (09/24 1143) Last BM Date: 12/16/14  Intake/Output from previous day: 09/24 0701 - 09/25 0700 In: 318 [P.O.:318] Out: 1700  Intake/Output this shift:    Resp: clear to auscultation bilaterally Cardio: regular rate and rhythm GI: soft, non-tender; bowel sounds normal; no masses,  no organomegaly  Lab Results:   Recent Labs  12/16/14 0335 12/17/14 0446  WBC 18.0* 15.7*  HGB 6.8* 8.0*  HCT 19.9* 23.0*  PLT 511* 555*   BMET  Recent Labs  12/17/14 0446 12/18/14 0428  NA 128* 126*  K 3.6 3.5  CL 93* 90*  CO2 20* 26  GLUCOSE 92 101*  BUN 53* 28*  CREATININE 10.12* 6.68*  CALCIUM 9.1 8.5*   PT/INR No results for input(s): LABPROT, INR in the last 72 hours. ABG No results for input(s): PHART, HCO3 in the last 72 hours.  Invalid input(s): PCO2, PO2  Studies/Results: No results found.  Anti-infectives: Anti-infectives    Start     Dose/Rate Route Frequency Ordered Stop   12/15/14 1430  vancomycin (VANCOCIN) IVPB 750 mg/150 ml premix     750 mg 150 mL/hr over 60 Minutes Intravenous To Hemodialysis 12/15/14 1401 12/15/14 1754   12/13/14 1615  vancomycin (VANCOCIN) IVPB 750 mg/150 ml premix     750 mg 150 mL/hr over 60 Minutes Intravenous  Once 12/13/14 1613 12/13/14 1715   12/12/14 1600  piperacillin-tazobactam (ZOSYN) IVPB 2.25 g  Status:  Discontinued     2.25 g 100 mL/hr over 30 Minutes Intravenous 3 times per day 12/12/14 1318 12/16/14 0753   12/12/14 0600  vancomycin (VANCOCIN) IVPB 750 mg/150 ml premix  Status:  Discontinued     750  mg 150 mL/hr over 60 Minutes Intravenous Every 24 hours 12/11/14 0622 12/12/14 1318   12/11/14 0700  vancomycin (VANCOCIN) 1,250 mg in sodium chloride 0.9 % 250 mL IVPB     1,250 mg 166.7 mL/hr over 90 Minutes Intravenous  Once 12/11/14 0622 12/11/14 0820   12/07/14 1500  piperacillin-tazobactam (ZOSYN) IVPB 3.375 g  Status:  Discontinued     3.375 g 12.5 mL/hr over 240 Minutes Intravenous Every 6 hours 12/07/14 0923 12/07/14 0925   12/07/14 1500  piperacillin-tazobactam (ZOSYN) IVPB 3.375 g  Status:  Discontinued     3.375 g 100 mL/hr over 30 Minutes Intravenous Every 6 hours 12/07/14 0925 12/12/14 1318   12/06/14 0900  piperacillin-tazobactam (ZOSYN) IVPB 2.25 g  Status:  Discontinued     2.25 g 100 mL/hr over 30 Minutes Intravenous Every 8 hours 12/06/14 0836 12/07/14 2423      Assessment/Plan: s/p Procedure(s): AMPUTATION ABOVE KNEE AMPUTATION (Left) Will get CT pelvis today to rule out graft infection per vascular surgery  May need dialysis catheter change if this is source of fever Start abx  LOS: 23 days    TOTH III,PAUL S 12/18/2014

## 2014-12-18 NOTE — Progress Notes (Signed)
ANTIBIOTIC CONSULT NOTE - INITIAL  Pharmacy Consult for Zosyn Indication: Sepsis  Allergies  Allergen Reactions  . Other Itching and Rash    Tape or gauze or plastic wound dressings.      Patient Measurements: Height: 5\' 9"  (175.3 cm) Weight: 171 lb 8.3 oz (77.8 kg) IBW/kg (Calculated) : 70.7 Adjusted Body Weight:   Vital Signs: Temp: 98.4 F (36.9 C) (09/25 0500) Temp Source: Oral (09/24 2141) BP: 131/75 mmHg (09/25 0500) Pulse Rate: 91 (09/25 0500) Intake/Output from previous day: 09/24 0701 - 09/25 0700 In: 318 [P.O.:318] Out: 1700  Intake/Output from this shift:    Labs:  Recent Labs  12/16/14 0335 12/17/14 0446 12/18/14 0428  WBC 18.0* 15.7*  --   HGB 6.8* 8.0*  --   PLT 511* 555*  --   CREATININE 6.91* 10.12* 6.68*   Estimated Creatinine Clearance: 17.2 mL/min (by C-G formula based on Cr of 6.68).  Recent Labs  12/15/14 1126  [place] 28*     Microbiology: Recent Results (from the past 720 hour(s))  MRSA PCR Screening     Status: None   Collection Time: 11/25/14 10:00 AM  Result Value Ref Range Status   MRSA by PCR NEGATIVE NEGATIVE Final    Comment:        The GeneXpert MRSA Assay (FDA approved for NASAL specimens only), is one component of a comprehensive MRSA colonization surveillance program. It is not intended to diagnose MRSA infection nor to guide or monitor treatment for MRSA infections.   Culture, blood (routine x 2)     Status: None   Collection Time: 12/10/14  1:00 PM  Result Value Ref Range Status   Specimen Description BLOOD BLOOD RIGHT FOREARM  Final   Special Requests BOTTLES DRAWN AEROBIC ONLY 5CC  Final   Culture NO GROWTH 5 DAYS  Final   Report Status 12/15/2014 FINAL  Final  Culture, blood (routine x 2)     Status: None   Collection Time: 12/10/14  1:06 PM  Result Value Ref Range Status   Specimen Description BLOOD BLOOD RIGHT HAND  Final   Special Requests BOTTLES DRAWN AEROBIC ONLY 5CC  Final   Culture NO  GROWTH 5 DAYS  Final   Report Status 12/15/2014 FINAL  Final  C difficile quick scan w PCR reflex     Status: None   Collection Time: 12/14/14 10:34 AM  Result Value Ref Range Status   C Diff antigen NEGATIVE NEGATIVE Final   C Diff toxin NEGATIVE NEGATIVE Final   C Diff interpretation Negative for toxigenic C. difficile  Final  Culture, blood (single)     Status: None (Preliminary result)   Collection Time: 12/17/14  3:55 PM  Result Value Ref Range Status   Specimen Description BLOOD CENTRAL LINE  Final   Special Requests BOTTLES DRAWN AEROBIC AND ANAEROBIC UNK  Final   Culture  Setup Time   Final    GRAM NEGATIVE RODS IN BOTH AEROBIC AND ANAEROBIC BOTTLES CRITICAL RESULT CALLED TO, READ BACK BY AND VERIFIED WITH: NANCY IRISH@0515  12/18/14 MKELLY    Culture PENDING  Incomplete   Report Status PENDING  Incomplete    Medical History: History reviewed. No pertinent past medical history.  Medications:  Scheduled:  . darbepoetin (ARANESP) injection - DIALYSIS  150 mcg Intravenous Q Thu-HD  . ferric gluconate (FERRLECIT/NULECIT) IV  125 mg Intravenous Q T,Th,Sa-HD  . heparin subcutaneous  5,000 Units Subcutaneous 3 times per day  . lanthanum  2,000 mg Oral  TID WC  . metoCLOPramide (REGLAN) injection  5 mg Intravenous 3 times per day   Assessment: 24yo male with fever and (+)GNR in blood cx from 9/24, MD notes as [facility], to resume Zosyn.  Pt is on intermittent HD, but with possible graft infection with access removed this AM and plans to replace 9/27.  Goal of Therapy:  treatment of infection  Plan:  Zosyn 2.25g IV q8 F/U culture and CT results  Gracy Bruins, PharmD Clinical Pharmacist [facility]
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
## 2014-12-18 NOTE — Progress Notes (Signed)
Notified Dr. Rosendo Gros of patient blood culture results- gram negative rods in aerobic and anaerobic bottles.

## 2014-12-18 NOTE — Progress Notes (Signed)
Called to room by patient's family.  Patient sitting on toilet; blood dripping from left stump. Moderate amount of blood on floor- dinner plate size?  Reinforced gauze dressing and assisted patient back to bed on [place].  Patient denies dizziness. Notified Dr. Barry Dienes. Will apply ace wrap per Dr. Barry Dienes.

## 2014-12-19 ENCOUNTER — Inpatient Hospital Stay (HOSPITAL_COMMUNITY): Payer: 59

## 2014-12-19 DIAGNOSIS — S32599A Other specified fracture of unspecified pubis, initial encounter for closed fracture: Secondary | ICD-10-CM | POA: Diagnosis present

## 2014-12-19 DIAGNOSIS — N186 End stage renal disease: Secondary | ICD-10-CM

## 2014-12-19 DIAGNOSIS — Z992 Dependence on renal dialysis: Secondary | ICD-10-CM

## 2014-12-19 LAB — RENAL FUNCTION PANEL
ALBUMIN: 2.2 g/dL — AB (ref 3.5–5.0)
Anion gap: 11 (ref 5–15)
BUN: 43 mg/dL — AB (ref 6–20)
CO2: 25 mmol/L (ref 22–32)
CREATININE: 9.2 mg/dL — AB (ref 0.61–1.24)
Calcium: 8.8 mg/dL — ABNORMAL LOW (ref 8.9–10.3)
Chloride: 92 mmol/L — ABNORMAL LOW (ref 101–111)
GFR, EST AFRICAN AMERICAN: 8 mL/min — AB (ref >60)
GFR, EST NON AFRICAN AMERICAN: 7 mL/min — AB (ref >60)
Glucose, Bld: 92 mg/dL (ref 65–99)
PHOSPHORUS: 6.7 mg/dL — AB (ref 2.5–4.6)
Potassium: 3.6 mmol/L (ref 3.5–5.1)
Sodium: 128 mmol/L — ABNORMAL LOW (ref 135–145)

## 2014-12-19 LAB — CBC
HEMATOCRIT: 20.6 % — AB (ref 39.0–52.0)
Hemoglobin: 6.8 g/dL — CL (ref 13.0–17.0)
MCH: 28.8 pg (ref 26.0–34.0)
MCHC: 33 g/dL (ref 30.0–36.0)
MCV: 87.3 fL (ref 78.0–100.0)
PLATELETS: 571 10*3/uL — AB (ref 150–400)
RBC: 2.36 MIL/uL — AB (ref 4.22–5.81)
RDW: 14.3 % (ref 11.5–15.5)
WBC: 18 10*3/uL — AB (ref 4.0–10.5)

## 2014-12-19 LAB — PREPARE RBC (CROSSMATCH)

## 2014-12-19 MED ORDER — SODIUM CHLORIDE 0.9 % IV SOLN
Freq: Once | INTRAVENOUS | Status: AC
  Administered 2014-12-19: 13:00:00 via INTRAVENOUS

## 2014-12-19 MED ORDER — MORPHINE SULFATE (PF) 2 MG/ML IV SOLN: 2.0000 mg | INTRAVENOUS | Status: DC | PRN

## 2014-12-19 MED ORDER — OXYCODONE HCL 5 MG PO TABS
10.0000 mg | ORAL_TABLET | ORAL | Status: DC | PRN
  Administered 2014-12-25: 10 mg via ORAL
  Filled 2014-12-19: qty 2
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
## 2014-12-19 NOTE — Progress Notes (Signed)
Rehab admissions - Continuing to follow for progress.  Noted patient for tunneled dialysis catheter placement tomorrow.  Call me for questions.  #[phone]

## 2014-12-19 NOTE — Progress Notes (Signed)
OT Cancellation Note  Patient Details Name: Caleb [name] MRN: [id number] DOB: [birth year]   Cancelled Treatment:    Reason Eval/Treat Not Completed: Patient at procedure or test/ unavailable (vascular for UE ) OT to reattempt later today as appropriate.   Peri Maris  Pager: [phone]  12/19/2014, 11:15 AM

## 2014-12-19 NOTE — Progress Notes (Signed)
ANTIBIOTIC CONSULT NOTE - FOLLOW UP  Pharmacy Consult:  Zosyn Indication: Sepsis  Allergies  Allergen Reactions  . Other Itching and Rash    Tape or gauze or plastic wound dressings.      Patient Measurements: Height: 5\' 9"  (175.3 cm) Weight: 178 lb 2.1 oz (80.8 kg) IBW/kg (Calculated) : 70.7  Vital Signs: Temp: 98.6 F (37 C) (09/26 1303) Temp Source: Oral (09/26 1303) BP: 139/85 mmHg (09/26 1303) Pulse Rate: 79 (09/26 1303) Intake/Output from previous day: 09/25 0701 - 09/26 0700 In: 530 [P.O.:480; IV Piggyback:50] Out: -  Intake/Output from this shift: Total I/O In: 320 [I.V.:140; Blood:180] Out: -   Labs:  Recent Labs  12/17/14 0446 12/18/14 0428 12/19/14 0337  WBC 15.7*  --  18.0*  HGB 8.0*  --  6.8*  PLT 555*  --  571*  CREATININE 10.12* 6.68* 9.20*   Estimated Creatinine Clearance: 12.5 mL/min (by C-G formula based on Cr of 9.2). No results for input(s): VANCOTROUGH, VANCOPEAK, VANCORANDOM, GENTTROUGH, GENTPEAK, GENTRANDOM, TOBRATROUGH, TOBRAPEAK, TOBRARND, AMIKACINPEAK, AMIKACINTROU, AMIKACIN in the last 72 hours.   Microbiology: Recent Results (from the past 720 hour(s))  MRSA PCR Screening     Status: None   Collection Time: 11/25/14 10:00 AM  Result Value Ref Range Status   MRSA by PCR NEGATIVE NEGATIVE Final    Comment:        The GeneXpert MRSA Assay (FDA approved for NASAL specimens only), is one component of a comprehensive MRSA colonization surveillance program. It is not intended to diagnose MRSA infection nor to guide or monitor treatment for MRSA infections.   Culture, blood (routine x 2)     Status: None   Collection Time: 12/10/14  1:00 PM  Result Value Ref Range Status   Specimen Description BLOOD BLOOD RIGHT FOREARM  Final   Special Requests BOTTLES DRAWN AEROBIC ONLY 5CC  Final   Culture NO GROWTH 5 DAYS  Final   Report Status 12/15/2014 FINAL  Final  Culture, blood (routine x 2)     Status: None   Collection Time:  12/10/14  1:06 PM  Result Value Ref Range Status   Specimen Description BLOOD BLOOD RIGHT HAND  Final   Special Requests BOTTLES DRAWN AEROBIC ONLY 5CC  Final   Culture NO GROWTH 5 DAYS  Final   Report Status 12/15/2014 FINAL  Final  C difficile quick scan w PCR reflex     Status: None   Collection Time: 12/14/14 10:34 AM  Result Value Ref Range Status   C Diff antigen NEGATIVE NEGATIVE Final   C Diff toxin NEGATIVE NEGATIVE Final   C Diff interpretation Negative for toxigenic C. difficile  Final  Culture, blood (routine x 2)     Status: None (Preliminary result)   Collection Time: 12/17/14  3:10 PM  Result Value Ref Range Status   Specimen Description BLOOD LEFT ANTECUBITAL  Final   Special Requests BOTTLES DRAWN AEROBIC AND ANAEROBIC 5CC  Final   Culture NO GROWTH 2 DAYS  Final   Report Status PENDING  Incomplete  Culture, blood (routine x 2)     Status: None (Preliminary result)   Collection Time: 12/17/14  3:15 PM  Result Value Ref Range Status   Specimen Description BLOOD LEFT ARM  Final   Special Requests BOTTLES DRAWN AEROBIC AND ANAEROBIC 5CC  Final   Culture NO GROWTH 2 DAYS  Final   Report Status PENDING  Incomplete  Culture, blood (single)     Status: None (  Preliminary result)   Collection Time: 12/17/14  3:55 PM  Result Value Ref Range Status   Specimen Description BLOOD CENTRAL LINE  Final   Special Requests BOTTLES DRAWN AEROBIC AND ANAEROBIC 10CC  Final   Culture  Setup Time   Final    GRAM NEGATIVE RODS IN BOTH AEROBIC AND ANAEROBIC BOTTLES CRITICAL RESULT CALLED TO, READ BACK BY AND VERIFIED WITH: NANCY IRISH@0515  12/18/14 MKELLY    Culture GRAM NEGATIVE RODS  Final   Report Status PENDING  Incomplete  Cath Tip Culture     Status: None (Preliminary result)   Collection Time: 12/18/14  9:48 AM  Result Value Ref Range Status   Specimen Description CATH TIP  Final   Special Requests NONE  Final   Culture NO GROWTH Performed at [facility]   Final    Report Status PENDING  Incomplete    Medical History: History reviewed. No pertinent past medical history.    Assessment: 22 YOM with fever and (+)GNR in blood cx from 12/17/14 to continue on Zosyn.  Patient is s/p CRRT and to continue iHD tomorrow after access is replaced.  Zosyn 9/13 >> 9/23; resumed 9/25 >> Vanc 9/18 >> 9/23  9/22 preHD VL = 28 mcg/mL >> gave 750 mg  9/24  blood x 3 - 1/3 (+)GNR (MD note says [facility]) 9/17 blood x 2 - NF 9/2 MRSA screen neg   Goal of Therapy:  treatment of infection   Plan:  - Continue Zosyn 2.25gm IV Q8H - Pharmacy will sign off as dosage adjustment is unnecessary.  Thank you for the consult!    Thuy D. Mina Marble, PharmD, BCPS Pager:  [phone] 12/19/2014, 1:33 PM
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
## 2014-12-19 NOTE — Progress Notes (Signed)
VASCULAR LAB PRELIMINARY  PRELIMINARY  PRELIMINARY  PRELIMINARY  Right  Upper Extremity Vein Map    Cephalic  Segment Diameter Depth Comment  1. Axilla 5.34 mm mm   2. Mid upper arm 5.25 mm mm   3. Above AC 5.02 mm mm   4. In AC 9.6 mm mm   5. Below AC 3.19 mm mm   6. Mid forearm 3.65 mm mm   7. Wrist 2.66 mm mm Very lateral.  Branching at wrist   mm mm    mm mm    mm mm    Basilic  Segment Diameter Depth Comment  1. Axilla 5.57 mm 10 mm   2. Mid upper arm 6.4 mm 12.5 mm   3. Above AC mm mm One branch wraps around arm, the other joins the cephalic at the [facility]  4. In AC mm mm   5. Below AC mm mm   6. Mid forearm mm mm   7. Wrist mm mm    mm mm    mm mm    mm mm    Left Upper Extremity Vein Map    Cephalic  Segment Diameter Depth Comment  1. Axilla 5.16 mm mm   2. Mid upper arm 4.24 mm mm   3. Above AC 5.3 mm mm   4. In AC 6.08 mm mm Elongated strand noted in the [facility]  5. Below AC 4.06 mm mm   6. Mid forearm 4.52 mm mm   7. Wrist 3.38 mm mm    mm mm    mm mm    mm mm    Basilic  Segment Diameter Depth Comment  1. Axilla mm mm   2. Mid upper arm 4.88 mm mm   3. Above AC 6.35 mm mm   4. In AC 4.75 mm mm   5. Below AC 4.98 mm mm   6. Mid forearm 4.47 mm mm Wraps around forearm  7. Wrist 2.61 mm mm    mm mm    mm mm    mm mm     Cestone,Helene, RVT 12/19/2014, 11:55 AM

## 2014-12-19 NOTE — Progress Notes (Signed)
   Preoperative Note   Procedure: tunneled dialysis catheter placement  Date: 12/20/14 (tomorrow Tuesday)  Preoperative diagnosis: ESRD  Consent: to be obtained  Laboratory:  CBC:    Component Value Date/Time   WBC 18.0* 12/19/2014 0337   RBC 2.36* 12/19/2014 0337   HGB 6.8* 12/19/2014 0337   HCT 20.6* 12/19/2014 0337   PLT 571* 12/19/2014 0337   MCV 87.3 12/19/2014 0337   MCH 28.8 12/19/2014 0337   MCHC 33.0 12/19/2014 0337   RDW 14.3 12/19/2014 0337   LYMPHSABS 1.5 12/15/2014 0247   MONOABS 1.4* 12/15/2014 0247   EOSABS 0.3 12/15/2014 0247   BASOSABS 0.2* 12/15/2014 0247    BMP:    Component Value Date/Time   NA 128* 12/19/2014 0337   K 3.6 12/19/2014 0337   CL 92* 12/19/2014 0337   CO2 25 12/19/2014 0337   GLUCOSE 92 12/19/2014 0337   BUN 43* 12/19/2014 0337   CREATININE 9.20* 12/19/2014 0337   CALCIUM 8.8* 12/19/2014 0337   GFRNONAA 7* 12/19/2014 0337   GFRAA 8* 12/19/2014 0337    Coagulation: Lab Results  Component Value Date   INR 1.49 11/26/2014   INR 1.53* 11/26/2014   INR 1.38 11/25/2014   No results found for: PTT   Adele Barthel, MD [facility] Office: [phone] Pager: [phone]  12/19/2014, 8:16 AM
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
## 2014-12-19 NOTE — Progress Notes (Signed)
Text paged patient's am Hgb results-6.8-to Dr. Barry Dienes.

## 2014-12-20 ENCOUNTER — Inpatient Hospital Stay (HOSPITAL_COMMUNITY): Payer: 59 | Admitting: Certified Registered"

## 2014-12-20 ENCOUNTER — Encounter (HOSPITAL_COMMUNITY): Admission: EM | Disposition: A | Discharge status: Rehabilitation Facility | Payer: Self-pay | Source: Home / Self Care

## 2014-12-20 ENCOUNTER — Inpatient Hospital Stay (HOSPITAL_COMMUNITY): Payer: 59

## 2014-12-20 ENCOUNTER — Encounter (HOSPITAL_COMMUNITY): Payer: Self-pay | Admitting: Certified Registered"

## 2014-12-20 DIAGNOSIS — N178 Other acute kidney failure: Secondary | ICD-10-CM

## 2014-12-20 HISTORY — PX: INSERTION OF DIALYSIS CATHETER: SHX1324

## 2014-12-20 LAB — CULTURE, BLOOD (SINGLE)

## 2014-12-20 LAB — TYPE AND SCREEN
ABO/RH(D): A POS
ABO/RH(D): A POS
Antibody Screen: NEGATIVE
Antibody Screen: NEGATIVE
Unit division: 0
Unit division: 0

## 2014-12-20 LAB — POCT I-STAT 4, (NA,K, GLUC, HGB,HCT)
GLUCOSE: 92 mg/dL (ref 65–99)
HEMATOCRIT: 22 % — AB (ref 39.0–52.0)
HEMOGLOBIN: 7.5 g/dL — AB (ref 13.0–17.0)
POTASSIUM: 4 mmol/L (ref 3.5–5.1)
SODIUM: 124 mmol/L — AB (ref 135–145)

## 2014-12-20 LAB — CBC
HCT: 22.3 % — ABNORMAL LOW (ref 39.0–52.0)
HEMOGLOBIN: 7.7 g/dL — AB (ref 13.0–17.0)
MCH: 29.8 pg (ref 26.0–34.0)
MCHC: 34.5 g/dL (ref 30.0–36.0)
MCV: 86.4 fL (ref 78.0–100.0)
Platelets: 487 10*3/uL — ABNORMAL HIGH (ref 150–400)
RBC: 2.58 MIL/uL — AB (ref 4.22–5.81)
RDW: 13.9 % (ref 11.5–15.5)
WBC: 14.9 10*3/uL — ABNORMAL HIGH (ref 4.0–10.5)

## 2014-12-20 LAB — RENAL FUNCTION PANEL
ANION GAP: 17 — AB (ref 5–15)
Albumin: 2.2 g/dL — ABNORMAL LOW (ref 3.5–5.0)
BUN: 58 mg/dL — ABNORMAL HIGH (ref 6–20)
CALCIUM: 8.9 mg/dL (ref 8.9–10.3)
CHLORIDE: 88 mmol/L — AB (ref 101–111)
CO2: 21 mmol/L — AB (ref 22–32)
Creatinine, Ser: 11.21 mg/dL — ABNORMAL HIGH (ref 0.61–1.24)
GFR calc Af Amer: 7 mL/min — ABNORMAL LOW (ref >60)
GFR calc non Af Amer: 6 mL/min — ABNORMAL LOW (ref >60)
Glucose, Bld: 88 mg/dL (ref 65–99)
Phosphorus: 7.9 mg/dL — ABNORMAL HIGH (ref 2.5–4.6)
Potassium: 3.8 mmol/L (ref 3.5–5.1)
Sodium: 126 mmol/L — ABNORMAL LOW (ref 135–145)

## 2014-12-20 SURGERY — INSERTION OF DIALYSIS CATHETER
Anesthesia: Monitor Anesthesia Care | Site: Neck | Laterality: Left

## 2014-12-20 MED ORDER — LIDOCAINE HCL (CARDIAC) 20 MG/ML IV SOLN
INTRAVENOUS | Status: AC
  Filled 2014-12-20: qty 5

## 2014-12-20 MED ORDER — CIPROFLOXACIN IN D5W 400 MG/200ML IV SOLN
400.0000 mg | INTRAVENOUS | Status: DC
  Administered 2014-12-21 – 2014-12-27 (×7): 400 mg via INTRAVENOUS
  Filled 2014-12-20 (×8): qty 200

## 2014-12-20 MED ORDER — PENTAFLUOROPROP-TETRAFLUOROETH EX AERO: 1.0000 "application " | INHALATION_SPRAY | CUTANEOUS | Status: DC | PRN

## 2014-12-20 MED ORDER — OXYCODONE HCL 5 MG/5ML PO SOLN: 5.0000 mg | Freq: Once | ORAL | Status: DC | PRN

## 2014-12-20 MED ORDER — SODIUM CHLORIDE 0.9 % IV SOLN: 100.0000 mL | INTRAVENOUS | Status: DC | PRN

## 2014-12-20 MED ORDER — SODIUM CHLORIDE 0.9 % IV SOLN
INTRAVENOUS | Status: DC
  Administered 2014-12-20: 09:00:00 via INTRAVENOUS

## 2014-12-20 MED ORDER — PHENYLEPHRINE 40 MCG/ML (10ML) SYRINGE FOR IV PUSH (FOR BLOOD PRESSURE SUPPORT)
PREFILLED_SYRINGE | INTRAVENOUS | Status: AC
  Filled 2014-12-20: qty 10

## 2014-12-20 MED ORDER — LIDOCAINE-PRILOCAINE 2.5-2.5 % EX CREA
1.0000 "application " | TOPICAL_CREAM | CUTANEOUS | Status: DC | PRN
  Filled 2014-12-20: qty 5

## 2014-12-20 MED ORDER — MIDAZOLAM HCL 2 MG/2ML IJ SOLN
INTRAMUSCULAR | Status: AC
  Filled 2014-12-20: qty 4

## 2014-12-20 MED ORDER — ALTEPLASE 2 MG IJ SOLR
2.0000 mg | Freq: Once | INTRAMUSCULAR | Status: DC | PRN
  Filled 2014-12-20: qty 2

## 2014-12-20 MED ORDER — PROPOFOL 10 MG/ML IV BOLUS
INTRAVENOUS | Status: AC
  Filled 2014-12-20: qty 20

## 2014-12-20 MED ORDER — SODIUM CHLORIDE 0.9 % IV SOLN
INTRAVENOUS | Status: DC | PRN
  Administered 2014-12-20: 500 mL

## 2014-12-20 MED ORDER — POLYETHYLENE GLYCOL 3350 17 G PO PACK
17.0000 g | PACK | Freq: Every day | ORAL | Status: DC
  Administered 2014-12-20: 17 g via ORAL
  Filled 2014-12-20 (×5): qty 1

## 2014-12-20 MED ORDER — HEPARIN SODIUM (PORCINE) 1000 UNIT/ML IJ SOLN
INTRAMUSCULAR | Status: AC
  Filled 2014-12-20: qty 1

## 2014-12-20 MED ORDER — FENTANYL CITRATE (PF) 100 MCG/2ML IJ SOLN: 25.0000 ug | INTRAMUSCULAR | Status: DC | PRN

## 2014-12-20 MED ORDER — DOCUSATE SODIUM 100 MG PO CAPS
100.0000 mg | ORAL_CAPSULE | Freq: Two times a day (BID) | ORAL | Status: DC
  Administered 2014-12-20 – 2014-12-21 (×3): 100 mg via ORAL
  Filled 2014-12-20 (×11): qty 1

## 2014-12-20 MED ORDER — LIDOCAINE HCL (PF) 1 % IJ SOLN
INTRAMUSCULAR | Status: AC
  Filled 2014-12-20: qty 30

## 2014-12-20 MED ORDER — HEPARIN SODIUM (PORCINE) 1000 UNIT/ML IJ SOLN
INTRAMUSCULAR | Status: DC | PRN
  Administered 2014-12-20: 1000 [IU]

## 2014-12-20 MED ORDER — NEPRO/CARBSTEADY PO LIQD
237.0000 mL | Freq: Two times a day (BID) | ORAL | Status: DC
  Administered 2014-12-20 – 2014-12-27 (×11): 237 mL via ORAL
  Filled 2014-12-20 (×17): qty 237

## 2014-12-20 MED ORDER — OXYCODONE HCL 5 MG PO TABS: 5.0000 mg | ORAL_TABLET | Freq: Once | ORAL | Status: DC | PRN

## 2014-12-20 MED ORDER — CIPROFLOXACIN IN D5W 400 MG/200ML IV SOLN
400.0000 mg | Freq: Two times a day (BID) | INTRAVENOUS | Status: DC
  Administered 2014-12-20: 400 mg via INTRAVENOUS
  Filled 2014-12-20 (×2): qty 200

## 2014-12-20 MED ORDER — PROTAMINE SULFATE 10 MG/ML IV SOLN
INTRAVENOUS | Status: AC
  Filled 2014-12-20: qty 5

## 2014-12-20 MED ORDER — 0.9 % SODIUM CHLORIDE (POUR BTL) OPTIME
TOPICAL | Status: DC | PRN
  Administered 2014-12-20: 1000 mL

## 2014-12-20 MED ORDER — ONDANSETRON HCL 4 MG/2ML IJ SOLN: 4.0000 mg | Freq: Four times a day (QID) | INTRAMUSCULAR | Status: DC | PRN

## 2014-12-20 MED ORDER — LIDOCAINE HCL 1 % IJ SOLN
INTRAMUSCULAR | Status: DC | PRN
  Administered 2014-12-20: 30 mL

## 2014-12-20 MED ORDER — LIDOCAINE HCL (PF) 1 % IJ SOLN: 5.0000 mL | INTRAMUSCULAR | Status: DC | PRN

## 2014-12-20 MED ORDER — FENTANYL CITRATE (PF) 250 MCG/5ML IJ SOLN
INTRAMUSCULAR | Status: DC | PRN
  Administered 2014-12-20: 100 ug via INTRAVENOUS

## 2014-12-20 MED ORDER — MIDAZOLAM HCL 5 MG/5ML IJ SOLN
INTRAMUSCULAR | Status: DC | PRN
  Administered 2014-12-20 (×2): 2 mg via INTRAVENOUS

## 2014-12-20 MED ORDER — PROPOFOL 500 MG/50ML IV EMUL
INTRAVENOUS | Status: DC | PRN
  Administered 2014-12-20: 150 ug/kg/min via INTRAVENOUS

## 2014-12-20 MED ORDER — HEPARIN SODIUM (PORCINE) 1000 UNIT/ML DIALYSIS
1000.0000 [IU] | INTRAMUSCULAR | Status: DC | PRN
  Filled 2014-12-20: qty 1

## 2014-12-20 MED ORDER — FENTANYL CITRATE (PF) 250 MCG/5ML IJ SOLN
INTRAMUSCULAR | Status: AC
  Filled 2014-12-20: qty 5

## 2014-12-20 SURGICAL SUPPLY — 51 items
BAG DECANTER FOR FLEXI CONT (MISCELLANEOUS) ×3 IMPLANT
BIOPATCH RED 1 DISK 7.0 (GAUZE/BANDAGES/DRESSINGS) ×2 IMPLANT
BIOPATCH RED 1IN DISK 7.0MM (GAUZE/BANDAGES/DRESSINGS) ×1
CATH ANGIO 5F BER2 65CM (CATHETERS) ×2 IMPLANT
CATH CANNON HEMO 15F 50CM (CATHETERS) IMPLANT
CATH CANNON HEMO 15FR 19 (HEMODIALYSIS SUPPLIES) IMPLANT
CATH CANNON HEMO 15FR 23CM (HEMODIALYSIS SUPPLIES) IMPLANT
CATH CANNON HEMO 15FR 31CM (HEMODIALYSIS SUPPLIES) IMPLANT
CATH CANNON HEMO 15FR 32 (HEMODIALYSIS SUPPLIES) IMPLANT
CATH CANNON HEMO 15FR 32CM (HEMODIALYSIS SUPPLIES) ×3 IMPLANT
CATH STRAIGHT 5FR 65CM (CATHETERS) IMPLANT
COVER PROBE W GEL 5X96 (DRAPES) ×3 IMPLANT
DRAPE C-ARM 42X72 X-RAY (DRAPES) ×3 IMPLANT
DRAPE CHEST BREAST 15X10 FENES (DRAPES) ×3 IMPLANT
GAUZE SPONGE 2X2 8PLY STRL LF (GAUZE/BANDAGES/DRESSINGS) ×1 IMPLANT
GAUZE SPONGE 4X4 16PLY XRAY LF (GAUZE/BANDAGES/DRESSINGS) ×3 IMPLANT
GLOVE BIO SURGEON STRL SZ7 (GLOVE) ×3 IMPLANT
GLOVE BIOGEL PI IND STRL 6.5 (GLOVE) IMPLANT
GLOVE BIOGEL PI IND STRL 7.5 (GLOVE) ×1 IMPLANT
GLOVE BIOGEL PI INDICATOR 6.5 (GLOVE) ×4
GLOVE BIOGEL PI INDICATOR 7.5 (GLOVE) ×2
GLOVE ECLIPSE 6.5 STRL STRAW (GLOVE) ×4 IMPLANT
GOWN STRL REUS W/ TWL LRG LVL3 (GOWN DISPOSABLE) ×2 IMPLANT
GOWN STRL REUS W/TWL LRG LVL3 (GOWN DISPOSABLE) ×6
KIT BASIN OR (CUSTOM PROCEDURE TRAY) ×3 IMPLANT
KIT ROOM TURNOVER OR (KITS) ×3 IMPLANT
LIQUID BAND (GAUZE/BANDAGES/DRESSINGS) ×2 IMPLANT
NDL 18GX1X1/2 (RX/OR ONLY) (NEEDLE) ×1 IMPLANT
NDL HYPO 25GX1X1/2 BEV (NEEDLE) ×1 IMPLANT
NDL PERC 18GX7CM (NEEDLE) IMPLANT
NEEDLE 18GX1X1/2 (RX/OR ONLY) (NEEDLE) ×3 IMPLANT
NEEDLE HYPO 25GX1X1/2 BEV (NEEDLE) ×3 IMPLANT
NEEDLE PERC 18GX7CM (NEEDLE) ×3 IMPLANT
NS IRRIG 1000ML POUR BTL (IV SOLUTION) ×3 IMPLANT
PACK SURGICAL SETUP 50X90 (CUSTOM PROCEDURE TRAY) ×3 IMPLANT
PAD ARMBOARD 7.5X6 YLW CONV (MISCELLANEOUS) ×6 IMPLANT
SET MICROPUNCTURE 5F STIFF (MISCELLANEOUS) IMPLANT
SHEATH AVANTI 11CM 5FR (MISCELLANEOUS) ×2 IMPLANT
SOAP 2 % CHG 4 OZ (WOUND CARE) ×3 IMPLANT
SPONGE GAUZE 2X2 STER 10/PKG (GAUZE/BANDAGES/DRESSINGS) ×2
SUT ETHILON 3 0 PS 1 (SUTURE) ×3 IMPLANT
SUT MNCRL AB 4-0 PS2 18 (SUTURE) ×3 IMPLANT
SYR 20CC LL (SYRINGE) ×6 IMPLANT
SYR 3ML LL SCALE MARK (SYRINGE) ×3 IMPLANT
SYR 5ML LL (SYRINGE) ×3 IMPLANT
SYR CONTROL 10ML LL (SYRINGE) ×3 IMPLANT
SYRINGE 10CC LL (SYRINGE) ×3 IMPLANT
TAPE CLOTH SURG 4X10 WHT LF (GAUZE/BANDAGES/DRESSINGS) ×2 IMPLANT
WATER STERILE IRR 1000ML POUR (IV SOLUTION) ×3 IMPLANT
WIRE AMPLATZ SS-J .035X180CM (WIRE) IMPLANT
WIRE BENTSON .035X145CM (WIRE) ×2 IMPLANT

## 2014-12-20 NOTE — Interval H&P Note (Signed)
History and Physical Interval Note:  12/20/2014 9:46 AM  Caleb [name]  has presented today for surgery, with the diagnosis of End Stage Renal Disease N18.6  The various methods of treatment have been discussed with the patient and family. After consideration of risks, benefits and other options for treatment, the patient has consented to  Procedure(s): INSERTION OF DIALYSIS CATHETER (N/A) as a surgical intervention .  The patient's history has been reviewed, patient examined, no change in status, stable for surgery.  I have reviewed the patient's chart and labs.  Questions were answered to the patient's satisfaction.     Adele Barthel
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
## 2014-12-20 NOTE — Anesthesia Preprocedure Evaluation (Signed)
Anesthesia Evaluation  Patient identified by MRN, date of birth, ID band Patient awake    Reviewed: Allergy & Precautions, NPO status , Patient's Chart, lab work & pertinent test results  Airway Mallampati: II   Neck ROM: full    Dental   Pulmonary neg pulmonary ROS,    breath sounds clear to auscultation       Cardiovascular + Peripheral Vascular Disease   Rhythm:regular Rate:Normal     Neuro/Psych    GI/Hepatic   Endo/Other    Renal/GU ESRF and DialysisRenal disease     Musculoskeletal   Abdominal   Peds  Hematology   Anesthesia Other Findings   Reproductive/Obstetrics                             Anesthesia Physical Anesthesia Plan  ASA: III  Anesthesia Plan: MAC   Post-op Pain Management:    Induction: Intravenous  Airway Management Planned: Simple Face Mask  Additional Equipment:   Intra-op Plan:   Post-operative Plan:   Informed Consent: I have reviewed the patients History and Physical, chart, labs and discussed the procedure including the risks, benefits and alternatives for the proposed anesthesia with the patient or authorized representative who has indicated his/her understanding and acceptance.     Plan Discussed with: CRNA, Anesthesiologist and Surgeon  Anesthesia Plan Comments:         Anesthesia Quick Evaluation

## 2014-12-20 NOTE — Progress Notes (Signed)
OT Cancellation Note  Patient Details Name: Caleb [name] MRN: [id number] DOB: 10-16-1990   Cancelled Treatment:    Reason Eval/Treat Not Completed: Patient at procedure or test/ unavailable (fistula placement) Ot to check back as appropriate.   Parke Poisson B 12/20/2014, 8:54 AM   Jeri Modena   OTR/L Pager: [phone] Office: [phone] .

## 2014-12-20 NOTE — Progress Notes (Signed)
Report given to maria rn as caregiver 

## 2014-12-20 NOTE — Transfer of Care (Signed)
Immediate Anesthesia Transfer of Care Note  Patient: Caleb [name]  Procedure(s) Performed: Procedure(s): INSERTION OF DIALYSIS CATHETER (Left)  Patient Location: PACU  Anesthesia Type:MAC  Level of Consciousness: sedated and responds to stimulation  Airway & Oxygen Therapy: Patient Spontanous Breathing and Patient connected to nasal cannula oxygen  Post-op Assessment: Report given to RN, Post -op Vital signs reviewed and stable and Patient moving all extremities  Post vital signs: Reviewed and stable  Last Vitals:  Filed Vitals:   12/20/14 0827  BP: 131/84  Pulse: 87  Temp: 37.4 C  Resp: 16    Complications: No apparent anesthesia complications
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
## 2014-12-20 NOTE — Op Note (Signed)
OPERATIVE NOTE  PROCEDURE: 1. Left internal jugular vein tunneled dialysis catheter placement 2. Left internal jugular vein cannulation under ultrasound guidance  PRE-OPERATIVE DIAGNOSIS: end-stage renal failure  POST-OPERATIVE DIAGNOSIS: same as above  SURGEON: Adele Barthel, MD  ANESTHESIA: local and MAC  ESTIMATED BLOOD LOSS: 30 cc  FINDING(S): 1.  Tips of the catheter in the right atrium on fluoroscopy 2.  No obvious pneumothorax on fluoroscopy  SPECIMEN(S):  none  INDICATIONS:   Caleb [name] is a 24 y.o. male who presents with end stage renal disease.  The patient presents for tunneled dialysis catheter placement.  The patient is aware the risks of tunneled dialysis catheter placement include but are not limited to: bleeding, infection, central venous injury, pneumothorax, possible venous stenosis, possible malpositioning in the venous system, and possible infections related to long-term catheter presence.  The patient was aware of these risks and agreed to proceed.  DESCRIPTION: After written full informed consent was obtained from the patient, the patient was taken back to the operating room.  Prior to induction, the patient was given IV antibiotics.  After obtaining adequate sedation, the patient was prepped and draped in the standard fashion for a chest or neck tunneled dialysis catheter placement.   The cannulation site, the catheter exit site, and tract for the subcutaneous tunnel were then anesthestized with a total of 20 cc of 1% Lidocaine without epinepherine.   Under ultrasound guidance, the left internal jugular vein was cannulated with the 18 gauge needle.  A J-wire was then placed down in the superior vena cava and would not track into the right atrium.  I had to place a 5-Fr sheath over the wire and then place a BER-2 catheter over the wire.  Using this combination, I directed the wire into the inferior vena cava under fluoroscopic guidance.  The wire was then  secured in place with a clamp to the drapes.  I then made stab incisions at the neck and exit sites.  I dissected from the exit site to the cannulation site with a metal tunneler.   The subcutaneous tunnel was dilated by passing a plastic dilator over the metal dissector.  The wire was then unclamped and I removed the needle.  An end-hole catheter was loaded over the wire and advanced into the right atrium.  The wire was then exchanged for an Amplatz super stiff wire.  The catheter was then removed.  Over the wire, the skin tract and venotomy was dilated serially with dilators.  Finally, the dilator-sheath was placed over the wire under fluoroscopic guidance into the superior vena cava.  The dilator was removed.  A 27 cm Diatek catheter was woven over the wire and advanced under fluoroscopic guidance down into the right atrium.  The wire was then removed, and the sheath was broken and peeled away while holding the catheter cuff at the level of the skin.  The back end of this catheter was transected, revealing the two lumens of this catheter.  The ports were docked onto these two lumens.  The catheter hub was then screwed into place.  Each port was tested by aspirating and flushing.  No resistance was noted.  Each port was then thoroughly flushed with heparinized saline.  The catheter was secured in placed with two interrupted stitches of 3-0 Nylon tied to the catheter.  The neck incision was closed with a U-stitch of 4-0 Monocryl.  The neck and chest incisions were cleaned and sterile bandages applied.  Each port was then loaded with concentrated heparin (1000 Units/mL) at the manufacturer recommended volumes to each port.  Sterile caps were applied to each port.  On completion fluoroscopy, the tips of the catheter were in the right atrium, and there was no evidence of pneumothorax.  COMPLICATIONS: none  CONDITION: stable   Adele Barthel, MD [facility] Office:  [phone] Pager: [phone]  12/20/2014, 11:51 AM

## 2014-12-20 NOTE — Progress Notes (Signed)
Inpatient Rehabilitation   Continuing to follow for eventual possible IP Rehab admission.    [facility] Admissions Coordinator Cell [phone] Office [phone]
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
## 2014-12-20 NOTE — Progress Notes (Signed)
PT Cancellation Note  Patient Details Name: OVILA LEPAGE MRN: [id number] DOB: 1990/06/04   Cancelled Treatment:    Reason Eval/Treat Not Completed: Patient at procedure or test/unavailable. Will continue to follow as able.    Cassell Clement, PT, CSCS Pager [phone] Office [phone]  12/20/2014, 8:59 AM

## 2014-12-21 ENCOUNTER — Encounter (HOSPITAL_COMMUNITY): Payer: Self-pay | Admitting: Vascular Surgery

## 2014-12-21 DIAGNOSIS — R7881 Bacteremia: Secondary | ICD-10-CM | POA: Diagnosis not present

## 2014-12-21 LAB — CATH TIP CULTURE: Culture: NO GROWTH

## 2014-12-21 LAB — RENAL FUNCTION PANEL
Albumin: 2 g/dL — ABNORMAL LOW (ref 3.5–5.0)
Anion gap: 11 (ref 5–15)
BUN: 21 mg/dL — AB (ref 6–20)
CHLORIDE: 91 mmol/L — AB (ref 101–111)
CO2: 28 mmol/L (ref 22–32)
CREATININE: 5.39 mg/dL — AB (ref 0.61–1.24)
Calcium: 8.5 mg/dL — ABNORMAL LOW (ref 8.9–10.3)
GFR calc Af Amer: 16 mL/min — ABNORMAL LOW (ref >60)
GFR calc non Af Amer: 14 mL/min — ABNORMAL LOW (ref >60)
GLUCOSE: 96 mg/dL (ref 65–99)
Phosphorus: 5.1 mg/dL — ABNORMAL HIGH (ref 2.5–4.6)
Potassium: 3.6 mmol/L (ref 3.5–5.1)
SODIUM: 130 mmol/L — AB (ref 135–145)

## 2014-12-21 NOTE — Progress Notes (Signed)
Per Aaron Edelman with [facility], currently no HD beds available at [place].  He plans to hold on insurance authorization for LTAC until bed availability is known.    Will continue to follow progress.  Reinaldo Raddle, RN, BSN  Trauma/Neuro ICU Case Manager [phone]
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
## 2014-12-22 LAB — CULTURE, BLOOD (ROUTINE X 2)
CULTURE: NO GROWTH
CULTURE: NO GROWTH

## 2014-12-22 LAB — URINALYSIS, ROUTINE W REFLEX MICROSCOPIC
Glucose, UA: NEGATIVE mg/dL
KETONES UR: NEGATIVE mg/dL
LEUKOCYTES UA: NEGATIVE
NITRITE: NEGATIVE
Specific Gravity, Urine: 1.021 (ref 1.005–1.030)
Urobilinogen, UA: 0.2 mg/dL (ref 0.0–1.0)
pH: 6.5 (ref 5.0–8.0)

## 2014-12-22 LAB — RENAL FUNCTION PANEL
ALBUMIN: 2 g/dL — AB (ref 3.5–5.0)
Anion gap: 12 (ref 5–15)
BUN: 36 mg/dL — ABNORMAL HIGH (ref 6–20)
CO2: 27 mmol/L (ref 22–32)
Calcium: 8.8 mg/dL — ABNORMAL LOW (ref 8.9–10.3)
Chloride: 92 mmol/L — ABNORMAL LOW (ref 101–111)
Creatinine, Ser: 7.43 mg/dL — ABNORMAL HIGH (ref 0.61–1.24)
GFR calc Af Amer: 11 mL/min — ABNORMAL LOW (ref >60)
GFR, EST NON AFRICAN AMERICAN: 9 mL/min — AB (ref >60)
GLUCOSE: 96 mg/dL (ref 65–99)
PHOSPHORUS: 7.1 mg/dL — AB (ref 2.5–4.6)
POTASSIUM: 3.5 mmol/L (ref 3.5–5.1)
Sodium: 131 mmol/L — ABNORMAL LOW (ref 135–145)

## 2014-12-22 LAB — URINE MICROSCOPIC-ADD ON

## 2014-12-22 MED ORDER — DARBEPOETIN ALFA 150 MCG/0.3ML IJ SOSY
150.0000 ug | PREFILLED_SYRINGE | INTRAMUSCULAR | Status: DC
  Filled 2014-12-22: qty 0.3

## 2014-12-22 NOTE — Clinical Social Work Note (Signed)
Clinical Social Worker continuing to follow patient and family for support and discharge planning needs.  Patient was able to get outside for a little while with therapies yesterday, however continues to show signs of disappointment regarding continued hospital stay.  Patient not able to return home due to issues with dialysis clipping and awaiting determination about potential placement at [facility] for dialysis need.  CM working with [facility] and patient family regarding LTAC placement.    Clinical Social Worker will sign off for now as social work intervention is no longer needed. Please consult [place] again if new need arises.  Barbette Or, [place]
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
## 2014-12-22 NOTE — Progress Notes (Signed)
[facility] currently has no HD beds available.  [facility]  has bed availability at this time, and is offering a bed to pt.  Plan to start insurance authorization for possible LTAC admission.  [facility] admissions liaison to follow up with Case Manager.    Reinaldo Raddle, RN, BSN  Trauma/Neuro ICU Case Manager [phone]
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
## 2014-12-22 NOTE — Progress Notes (Signed)
Urine specimen sent off to lab this pm. Left stump inspected at 1515hrs , no bleeding noted

## 2014-12-22 NOTE — Progress Notes (Signed)
Rehab admissions - continuing to follow for possible need for inpatient rehab admission once more permanent dialysis needs are known.  Call me for questions.  #[phone]
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
## 2014-12-23 LAB — RENAL FUNCTION PANEL
ANION GAP: 12 (ref 5–15)
Albumin: 2.2 g/dL — ABNORMAL LOW (ref 3.5–5.0)
BUN: 47 mg/dL — ABNORMAL HIGH (ref 6–20)
CHLORIDE: 93 mmol/L — AB (ref 101–111)
CO2: 26 mmol/L (ref 22–32)
Calcium: 8.5 mg/dL — ABNORMAL LOW (ref 8.9–10.3)
Creatinine, Ser: 8.87 mg/dL — ABNORMAL HIGH (ref 0.61–1.24)
GFR calc Af Amer: 9 mL/min — ABNORMAL LOW (ref >60)
GFR calc non Af Amer: 7 mL/min — ABNORMAL LOW (ref >60)
GLUCOSE: 98 mg/dL (ref 65–99)
POTASSIUM: 3.6 mmol/L (ref 3.5–5.1)
Phosphorus: 6.9 mg/dL — ABNORMAL HIGH (ref 2.5–4.6)
SODIUM: 131 mmol/L — AB (ref 135–145)
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
## 2014-12-23 NOTE — Progress Notes (Signed)
Orthopedic Tech Progress Note Patient Details:  Caleb [name] April 09, 1990 [id number]  Patient ID: [name], male   DOB: [birth year], 24 y.o.   MRN: [id number]   [place] Pink 12/23/2014, 10:15 AMTrapeze bar
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
## 2014-12-24 LAB — RENAL FUNCTION PANEL
ALBUMIN: 2.2 g/dL — AB (ref 3.5–5.0)
ANION GAP: 12 (ref 5–15)
BUN: 53 mg/dL — ABNORMAL HIGH (ref 6–20)
CALCIUM: 8.6 mg/dL — AB (ref 8.9–10.3)
CO2: 26 mmol/L (ref 22–32)
Chloride: 93 mmol/L — ABNORMAL LOW (ref 101–111)
Creatinine, Ser: 9.17 mg/dL — ABNORMAL HIGH (ref 0.61–1.24)
GFR calc Af Amer: 8 mL/min — ABNORMAL LOW (ref >60)
GFR, EST NON AFRICAN AMERICAN: 7 mL/min — AB (ref >60)
GLUCOSE: 99 mg/dL (ref 65–99)
PHOSPHORUS: 7.4 mg/dL — AB (ref 2.5–4.6)
POTASSIUM: 3.6 mmol/L (ref 3.5–5.1)
SODIUM: 131 mmol/L — AB (ref 135–145)

## 2014-12-24 NOTE — Progress Notes (Signed)
  [facility]  Nylon sutures removed from thigh fasciotomy sites. Incisions healing well.   Virgina Jock, PA-C [facility] Office: [phone] Pager: [phone] 12/24/2014 9:43 AM
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
## 2014-12-25 LAB — RENAL FUNCTION PANEL
ALBUMIN: 2.3 g/dL — AB (ref 3.5–5.0)
ANION GAP: 12 (ref 5–15)
BUN: 57 mg/dL — ABNORMAL HIGH (ref 6–20)
CHLORIDE: 94 mmol/L — AB (ref 101–111)
CO2: 26 mmol/L (ref 22–32)
Calcium: 8.7 mg/dL — ABNORMAL LOW (ref 8.9–10.3)
Creatinine, Ser: 8.84 mg/dL — ABNORMAL HIGH (ref 0.61–1.24)
GFR calc Af Amer: 9 mL/min — ABNORMAL LOW (ref >60)
GFR, EST NON AFRICAN AMERICAN: 8 mL/min — AB (ref >60)
Glucose, Bld: 93 mg/dL (ref 65–99)
PHOSPHORUS: 7.6 mg/dL — AB (ref 2.5–4.6)
POTASSIUM: 3.6 mmol/L (ref 3.5–5.1)
Sodium: 132 mmol/L — ABNORMAL LOW (ref 135–145)
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
## 2014-12-25 NOTE — Progress Notes (Signed)
Left stump dressing new bloody drainage. Dressing changed.

## 2014-12-26 LAB — MRSA PCR SCREENING: MRSA by PCR: NEGATIVE

## 2014-12-26 LAB — RENAL FUNCTION PANEL
ANION GAP: 11 (ref 5–15)
Albumin: 2.3 g/dL — ABNORMAL LOW (ref 3.5–5.0)
BUN: 57 mg/dL — ABNORMAL HIGH (ref 6–20)
CALCIUM: 8.7 mg/dL — AB (ref 8.9–10.3)
CO2: 26 mmol/L (ref 22–32)
CREATININE: 7.77 mg/dL — AB (ref 0.61–1.24)
Chloride: 95 mmol/L — ABNORMAL LOW (ref 101–111)
GFR calc non Af Amer: 9 mL/min — ABNORMAL LOW (ref >60)
GFR, EST AFRICAN AMERICAN: 10 mL/min — AB (ref >60)
Glucose, Bld: 105 mg/dL — ABNORMAL HIGH (ref 65–99)
PHOSPHORUS: 7.2 mg/dL — AB (ref 2.5–4.6)
Potassium: 3.5 mmol/L (ref 3.5–5.1)
Sodium: 132 mmol/L — ABNORMAL LOW (ref 135–145)

## 2014-12-26 MED ORDER — OXYCODONE HCL 5 MG PO TABS: 5.0000 mg | ORAL_TABLET | ORAL | Status: DC | PRN
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
## 2014-12-26 NOTE — Progress Notes (Signed)
       Bloody drainage on dressing minimal at stump s/p left AKA Fasciotomy sites incisions healing well   S/P Left AKA  Viable AKA  COLLINS, EMMA MAUREEN PA-C
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
## 2014-12-26 NOTE — Progress Notes (Signed)
OT Cancellation Note  Patient Details Name: Caleb [name] MRN: [id number] DOB: Nov 06, 1990   Cancelled Treatment:    Reason Eval/Treat Not Completed: Other (comment) (Nurse reports pt is outside at this time.)  Benito Mccreedy OTR/L [phone] 12/26/2014, 3:16 PM
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
## 2014-12-27 ENCOUNTER — Inpatient Hospital Stay (HOSPITAL_COMMUNITY)
Admission: RE | Admit: 2014-12-27 | Discharge: 2015-01-02 | DRG: 560 | Disposition: A | Discharge status: Home / Self Care | Payer: 59 | Source: Intra-hospital | Attending: Physical Medicine & Rehabilitation | Admitting: Physical Medicine & Rehabilitation

## 2014-12-27 ENCOUNTER — Telehealth: Payer: Self-pay | Admitting: Registered Nurse

## 2014-12-27 ENCOUNTER — Telehealth: Payer: Self-pay | Admitting: Vascular Surgery

## 2014-12-27 ENCOUNTER — Encounter (HOSPITAL_COMMUNITY): Payer: Self-pay | Admitting: Emergency Medicine

## 2014-12-27 DIAGNOSIS — B9689 Other specified bacterial agents as the cause of diseases classified elsewhere: Secondary | ICD-10-CM | POA: Diagnosis present

## 2014-12-27 DIAGNOSIS — W3400XD Accidental discharge from unspecified firearms or gun, subsequent encounter: Secondary | ICD-10-CM | POA: Diagnosis not present

## 2014-12-27 DIAGNOSIS — K5900 Constipation, unspecified: Secondary | ICD-10-CM | POA: Diagnosis present

## 2014-12-27 DIAGNOSIS — S0181XD Laceration without foreign body of other part of head, subsequent encounter: Secondary | ICD-10-CM | POA: Diagnosis not present

## 2014-12-27 DIAGNOSIS — S31109D Unspecified open wound of abdominal wall, unspecified quadrant without penetration into peritoneal cavity, subsequent encounter: Secondary | ICD-10-CM | POA: Diagnosis not present

## 2014-12-27 DIAGNOSIS — R7881 Bacteremia: Secondary | ICD-10-CM | POA: Diagnosis present

## 2014-12-27 DIAGNOSIS — Z189 Retained foreign body fragments, unspecified material: Secondary | ICD-10-CM

## 2014-12-27 DIAGNOSIS — D62 Acute posthemorrhagic anemia: Secondary | ICD-10-CM | POA: Diagnosis present

## 2014-12-27 DIAGNOSIS — S31109S Unspecified open wound of abdominal wall, unspecified quadrant without penetration into peritoneal cavity, sequela: Secondary | ICD-10-CM

## 2014-12-27 DIAGNOSIS — R531 Weakness: Secondary | ICD-10-CM | POA: Diagnosis present

## 2014-12-27 DIAGNOSIS — W3400XS Accidental discharge from unspecified firearms or gun, sequela: Secondary | ICD-10-CM

## 2014-12-27 DIAGNOSIS — Z4781 Encounter for orthopedic aftercare following surgical amputation: Secondary | ICD-10-CM | POA: Diagnosis not present

## 2014-12-27 DIAGNOSIS — Z419 Encounter for procedure for purposes other than remedying health state, unspecified: Secondary | ICD-10-CM

## 2014-12-27 DIAGNOSIS — F419 Anxiety disorder, unspecified: Secondary | ICD-10-CM | POA: Diagnosis present

## 2014-12-27 DIAGNOSIS — R000 Tachycardia, unspecified: Secondary | ICD-10-CM | POA: Diagnosis present

## 2014-12-27 DIAGNOSIS — W3400XA Accidental discharge from unspecified firearms or gun, initial encounter: Secondary | ICD-10-CM

## 2014-12-27 DIAGNOSIS — Z89612 Acquired absence of left leg above knee: Secondary | ICD-10-CM | POA: Diagnosis not present

## 2014-12-27 DIAGNOSIS — N178 Other acute kidney failure: Secondary | ICD-10-CM | POA: Diagnosis not present

## 2014-12-27 DIAGNOSIS — Z992 Dependence on renal dialysis: Secondary | ICD-10-CM

## 2014-12-27 DIAGNOSIS — N179 Acute kidney failure, unspecified: Secondary | ICD-10-CM | POA: Diagnosis present

## 2014-12-27 DIAGNOSIS — S31139A Puncture wound of abdominal wall without foreign body, unspecified quadrant without penetration into peritoneal cavity, initial encounter: Secondary | ICD-10-CM

## 2014-12-27 DIAGNOSIS — M6282 Rhabdomyolysis: Secondary | ICD-10-CM | POA: Diagnosis present

## 2014-12-27 DIAGNOSIS — Z91048 Other nonmedicinal substance allergy status: Secondary | ICD-10-CM

## 2014-12-27 LAB — RENAL FUNCTION PANEL
ALBUMIN: 2.4 g/dL — AB (ref 3.5–5.0)
Anion gap: 11 (ref 5–15)
BUN: 52 mg/dL — AB (ref 6–20)
CHLORIDE: 97 mmol/L — AB (ref 101–111)
CO2: 28 mmol/L (ref 22–32)
CREATININE: 6.62 mg/dL — AB (ref 0.61–1.24)
Calcium: 8.9 mg/dL (ref 8.9–10.3)
GFR calc Af Amer: 12 mL/min — ABNORMAL LOW (ref >60)
GFR, EST NON AFRICAN AMERICAN: 11 mL/min — AB (ref >60)
Glucose, Bld: 109 mg/dL — ABNORMAL HIGH (ref 65–99)
PHOSPHORUS: 6.5 mg/dL — AB (ref 2.5–4.6)
Potassium: 3.3 mmol/L — ABNORMAL LOW (ref 3.5–5.1)
SODIUM: 136 mmol/L (ref 135–145)

## 2014-12-27 LAB — CBC
HCT: 19.5 % — ABNORMAL LOW (ref 39.0–52.0)
HCT: 20.3 % — ABNORMAL LOW (ref 39.0–52.0)
Hemoglobin: 6.1 g/dL — CL (ref 13.0–17.0)
Hemoglobin: 6.4 g/dL — CL (ref 13.0–17.0)
MCH: 27.4 pg (ref 26.0–34.0)
MCH: 27.5 pg (ref 26.0–34.0)
MCHC: 31.3 g/dL (ref 30.0–36.0)
MCHC: 31.5 g/dL (ref 30.0–36.0)
MCV: 87.4 fL (ref 78.0–100.0)
MCV: 87.1 fL (ref 78.0–100.0)
PLATELETS: 487 10*3/uL — AB (ref 150–400)
PLATELETS: 510 10*3/uL — AB (ref 150–400)
RBC: 2.23 MIL/uL — ABNORMAL LOW (ref 4.22–5.81)
RBC: 2.33 MIL/uL — AB (ref 4.22–5.81)
RDW: 13.9 % (ref 11.5–15.5)
RDW: 14 % (ref 11.5–15.5)
WBC: 9 10*3/uL (ref 4.0–10.5)
WBC: 9.1 10*3/uL (ref 4.0–10.5)

## 2014-12-27 LAB — CREATININE, SERUM
CREATININE: 5.61 mg/dL — AB (ref 0.61–1.24)
GFR, EST AFRICAN AMERICAN: 15 mL/min — AB (ref >60)
GFR, EST NON AFRICAN AMERICAN: 13 mL/min — AB (ref >60)

## 2014-12-27 MED ORDER — NEPRO/CARBSTEADY PO LIQD: 237.0000 mL | Freq: Two times a day (BID) | ORAL | Status: DC

## 2014-12-27 MED ORDER — ONDANSETRON HCL 4 MG PO TABS: 4.0000 mg | ORAL_TABLET | Freq: Four times a day (QID) | ORAL | Status: DC | PRN

## 2014-12-27 MED ORDER — SORBITOL 70 % SOLN
30.0000 mL | Freq: Every day | Status: DC | PRN
  Filled 2014-12-27: qty 30

## 2014-12-27 MED ORDER — ONDANSETRON HCL 4 MG/2ML IJ SOLN: 4.0000 mg | Freq: Four times a day (QID) | INTRAMUSCULAR | Status: DC | PRN

## 2014-12-27 MED ORDER — LORAZEPAM 1 MG PO TABS: 1.0000 mg | ORAL_TABLET | ORAL | Status: DC | PRN

## 2014-12-27 MED ORDER — OXYCODONE HCL 5 MG PO TABS
5.0000 mg | ORAL_TABLET | ORAL | Status: DC | PRN
  Administered 2014-12-29 – 2015-01-04 (×10): 10 mg via ORAL
  Filled 2014-12-27 (×11): qty 2

## 2014-12-27 MED ORDER — HEPARIN SODIUM (PORCINE) 5000 UNIT/ML IJ SOLN
5000.0000 [IU] | Freq: Three times a day (TID) | INTRAMUSCULAR | Status: DC
  Administered 2014-12-27 – 2014-12-29 (×5): 5000 [IU] via SUBCUTANEOUS
  Filled 2014-12-27 (×5): qty 1

## 2014-12-27 MED ORDER — LORAZEPAM 2 MG/ML IJ SOLN: 1.0000 mg | INTRAMUSCULAR | Status: DC | PRN

## 2014-12-27 MED ORDER — HEPARIN SODIUM (PORCINE) 5000 UNIT/ML IJ SOLN: 5000.0000 [IU] | Freq: Three times a day (TID) | INTRAMUSCULAR | Status: DC

## 2014-12-27 MED ORDER — CIPROFLOXACIN HCL 500 MG PO TABS
500.0000 mg | ORAL_TABLET | Freq: Every day | ORAL | Status: AC
  Administered 2014-12-28 – 2014-12-31 (×4): 500 mg via ORAL
  Filled 2014-12-27 (×4): qty 1

## 2014-12-27 MED ORDER — LANTHANUM CARBONATE 500 MG PO CHEW
2000.0000 mg | CHEWABLE_TABLET | Freq: Three times a day (TID) | ORAL | Status: DC
  Administered 2014-12-27 – 2014-12-29 (×5): 2000 mg via ORAL
  Filled 2014-12-27 (×8): qty 4

## 2014-12-27 MED ORDER — ACETAMINOPHEN 160 MG/5ML PO SOLN: 650.0000 mg | Freq: Four times a day (QID) | ORAL | Status: DC | PRN
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
## 2014-12-27 NOTE — Progress Notes (Signed)
CRITICAL VALUE ALERT  Critical value received:  Hgb=6.1  Date of notification:  12-27-14  Time of notification:  0647  Critical value read back:yes  Nurse who received alert:  Clovis Pu  MD notified (1st page):    Time of first page:    MD notified (2nd page):  Time of second page:  Responding IR:[id number]  Time MD responded:  [phone]
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
## 2014-12-27 NOTE — Progress Notes (Signed)
Rehab admissions - I am awaiting call back from [facility] case manager.  We should know soon about possible inpatient rehab admission.  I will let all know once I hear back from insurance carrier.  Call me for questions.  #[phone]
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
## 2014-12-27 NOTE — Progress Notes (Signed)
Late entry: During assessment, it was noted that pt's L AKA dressing was 50% saturated with dark red blood. Visible blood dripping from L lateral posterior side of incision. Mid anterior medial and mid lateral side of incision appeared to be dehisced. New dressing applied and reinforced.
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
## 2014-12-27 NOTE — Progress Notes (Signed)
PT Cancellation Note  Patient Details Name: Caleb [name] MRN: [id number] DOB: 1991-03-06   Cancelled Treatment:    Reason Eval/Treat Not Completed: Other (comment) Pt being discharged to inpatient rehab today.   Marguarite Arbour A Avilyn Virtue 12/27/2014, 3:06 PM  Wray Kearns, [place], DPT [phone]

## 2014-12-27 NOTE — Progress Notes (Signed)
Rehab admissions - I have approval from [facility] for acute inpatient rehab admission for today.  I spoke with patient and he is in agreement.  Bed available and will admit to inpatient rehab today.  Call me for questions.  #[phone]

## 2014-12-27 NOTE — Care Management Note (Signed)
Case Management Note  Patient Details  Name: Caleb [name] MRN: [id number] Date of Birth: Sep 23, 1990  Subjective/Objective:   Pt no longer needing HD; renal status improving daily.  Pt approved by insurance today for admission to CIR.                   Action/Plan: Plan dc to [facility] later today.    Expected Discharge Date:  12/27/14 Expected Discharge Plan:  IP Rehab Facility  In-House Referral:  Clinical Social Work  Discharge planning Services  CM Consult  Post Acute Care Choice:    Choice offered to:     DME Arranged:    DME Agency:     HH Arranged:    [place] Agency:     Status of Service:  Completed, signed off  Medicare Important Message Given:    Date Medicare IM Given:    Medicare IM give by:    Date Additional Medicare IM Given:    Additional Medicare Important Message give by:     If discussed at [place] of Stay Meetings, dates discussed:    Additional Comments:  Reinaldo Raddle, RN, BSN  Trauma/Neuro ICU Case Manager [phone]
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
## 2014-12-27 NOTE — Telephone Encounter (Signed)
Unable to reach pt, dpm
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
## 2014-12-27 NOTE — Progress Notes (Signed)
Report called to Dolores Lory, RN at [facility]. Pt transferred in stable condition to 4W04.

## 2014-12-27 NOTE — Interval H&P Note (Signed)
MCCLAIN SHALL was admitted today to Inpatient Rehabilitation with the diagnosis of left AKA.  The patient's history has been reviewed, patient examined, and there is no change in status.  Patient continues to be appropriate for intensive inpatient rehabilitation.  I have reviewed the patient's chart and labs.  Questions were answered to the patient's satisfaction. The PAPE has been reviewed and assessment remains appropriate.  Derin Matthes T 12/27/2014, 6:55 PM
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
## 2014-12-28 ENCOUNTER — Inpatient Hospital Stay (HOSPITAL_COMMUNITY): Payer: 59 | Admitting: Occupational Therapy

## 2014-12-28 ENCOUNTER — Inpatient Hospital Stay (HOSPITAL_COMMUNITY): Payer: 59 | Admitting: Physical Therapy

## 2014-12-28 LAB — CBC WITH DIFFERENTIAL/PLATELET
BASOS ABS: 0.1 10*3/uL (ref 0.0–0.1)
Basophils Relative: 1 %
Eosinophils Absolute: 0.4 10*3/uL (ref 0.0–0.7)
Eosinophils Relative: 5 %
HEMATOCRIT: 19.6 % — AB (ref 39.0–52.0)
Hemoglobin: 6.4 g/dL — CL (ref 13.0–17.0)
LYMPHS ABS: 1.1 10*3/uL (ref 0.7–4.0)
Lymphocytes Relative: 12 %
MCH: 28.2 pg (ref 26.0–34.0)
MCHC: 32.7 g/dL (ref 30.0–36.0)
MCV: 86.3 fL (ref 78.0–100.0)
MONO ABS: 0.7 10*3/uL (ref 0.1–1.0)
Monocytes Relative: 7 %
NEUTROS ABS: 6.9 10*3/uL (ref 1.7–7.7)
Neutrophils Relative %: 75 %
Platelets: 543 10*3/uL — ABNORMAL HIGH (ref 150–400)
RBC: 2.27 MIL/uL — ABNORMAL LOW (ref 4.22–5.81)
RDW: 14 % (ref 11.5–15.5)
WBC: 9.1 10*3/uL (ref 4.0–10.5)

## 2014-12-28 LAB — COMPREHENSIVE METABOLIC PANEL
ALT: 60 U/L (ref 17–63)
ANION GAP: 12 (ref 5–15)
AST: 78 U/L — AB (ref 15–41)
Albumin: 2.5 g/dL — ABNORMAL LOW (ref 3.5–5.0)
Alkaline Phosphatase: 70 U/L (ref 38–126)
BILIRUBIN TOTAL: 0.6 mg/dL (ref 0.3–1.2)
BUN: 43 mg/dL — AB (ref 6–20)
CALCIUM: 9.3 mg/dL (ref 8.9–10.3)
CO2: 26 mmol/L (ref 22–32)
Chloride: 100 mmol/L — ABNORMAL LOW (ref 101–111)
Creatinine, Ser: 5.05 mg/dL — ABNORMAL HIGH (ref 0.61–1.24)
GFR calc Af Amer: 17 mL/min — ABNORMAL LOW (ref >60)
GFR, EST NON AFRICAN AMERICAN: 15 mL/min — AB (ref >60)
Glucose, Bld: 94 mg/dL (ref 65–99)
POTASSIUM: 3.3 mmol/L — AB (ref 3.5–5.1)
Sodium: 138 mmol/L (ref 135–145)
TOTAL PROTEIN: 6.3 g/dL — AB (ref 6.5–8.1)

## 2014-12-28 LAB — ABO/RH: ABO/RH(D): A POS

## 2014-12-28 LAB — PREPARE RBC (CROSSMATCH)

## 2014-12-28 MED ORDER — POTASSIUM CHLORIDE CRYS ER 20 MEQ PO TBCR: 20.0000 meq | EXTENDED_RELEASE_TABLET | Freq: Every day | ORAL | Status: DC

## 2014-12-28 MED ORDER — POTASSIUM CHLORIDE CRYS ER 20 MEQ PO TBCR
20.0000 meq | EXTENDED_RELEASE_TABLET | Freq: Once | ORAL | Status: AC
  Administered 2014-12-28: 20 meq via ORAL
  Filled 2014-12-28: qty 1

## 2014-12-28 MED ORDER — SODIUM CHLORIDE 0.9 % IV SOLN
Freq: Once | INTRAVENOUS | Status: AC
  Administered 2014-12-28: 16:00:00 via INTRAVENOUS

## 2014-12-28 MED ORDER — ACETAMINOPHEN 325 MG PO TABS
650.0000 mg | ORAL_TABLET | Freq: Once | ORAL | Status: AC
  Administered 2014-12-28: 650 mg via ORAL
  Filled 2014-12-28: qty 2
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
## 2014-12-28 NOTE — Progress Notes (Signed)
Patient information reviewed and entered into eRehab system by Yeily Link, RN, CRRN, PPS Coordinator.  Information including medical coding and functional independence measure will be reviewed and updated through discharge.    

## 2014-12-28 NOTE — Progress Notes (Addendum)
Received critical Hgb of 6.4. Pt AO4. Vitals are stable. No distress noted. L- AKA wound had old drainage and did not appear to be bleeding. Notified on-call provider. No new orders received.
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
## 2014-12-29 ENCOUNTER — Inpatient Hospital Stay (HOSPITAL_COMMUNITY): Payer: 59 | Admitting: Occupational Therapy

## 2014-12-29 ENCOUNTER — Inpatient Hospital Stay (HOSPITAL_COMMUNITY): Payer: Self-pay | Admitting: Occupational Therapy

## 2014-12-29 ENCOUNTER — Inpatient Hospital Stay (HOSPITAL_COMMUNITY): Payer: 59 | Admitting: Physical Therapy

## 2014-12-29 DIAGNOSIS — N178 Other acute kidney failure: Secondary | ICD-10-CM

## 2014-12-29 LAB — RENAL FUNCTION PANEL
Albumin: 2.6 g/dL — ABNORMAL LOW (ref 3.5–5.0)
Anion gap: 11 (ref 5–15)
BUN: 29 mg/dL — AB (ref 6–20)
CHLORIDE: 99 mmol/L — AB (ref 101–111)
CO2: 27 mmol/L (ref 22–32)
CREATININE: 3.44 mg/dL — AB (ref 0.61–1.24)
Calcium: 9.2 mg/dL (ref 8.9–10.3)
GFR calc Af Amer: 27 mL/min — ABNORMAL LOW (ref >60)
GFR calc non Af Amer: 24 mL/min — ABNORMAL LOW (ref >60)
GLUCOSE: 95 mg/dL (ref 65–99)
POTASSIUM: 3.3 mmol/L — AB (ref 3.5–5.1)
Phosphorus: 5 mg/dL — ABNORMAL HIGH (ref 2.5–4.6)
Sodium: 137 mmol/L (ref 135–145)

## 2014-12-29 LAB — CBC
HEMATOCRIT: 26.5 % — AB (ref 39.0–52.0)
Hemoglobin: 8.4 g/dL — ABNORMAL LOW (ref 13.0–17.0)
MCH: 27.7 pg (ref 26.0–34.0)
MCHC: 31.7 g/dL (ref 30.0–36.0)
MCV: 87.5 fL (ref 78.0–100.0)
PLATELETS: 497 10*3/uL — AB (ref 150–400)
RBC: 3.03 MIL/uL — ABNORMAL LOW (ref 4.22–5.81)
RDW: 14.1 % (ref 11.5–15.5)
WBC: 9.2 10*3/uL (ref 4.0–10.5)

## 2014-12-29 LAB — TYPE AND SCREEN
ABO/RH(D): A POS
ANTIBODY SCREEN: NEGATIVE
UNIT DIVISION: 0
UNIT DIVISION: 0

## 2014-12-29 MED ORDER — HEPARIN SODIUM (PORCINE) 5000 UNIT/ML IJ SOLN
5000.0000 [IU] | Freq: Three times a day (TID) | INTRAMUSCULAR | Status: DC
  Filled 2014-12-29 (×4): qty 1

## 2014-12-29 MED ORDER — POTASSIUM CHLORIDE CRYS ER 20 MEQ PO TBCR
40.0000 meq | EXTENDED_RELEASE_TABLET | Freq: Once | ORAL | Status: AC
  Administered 2014-12-29: 40 meq via ORAL
  Filled 2014-12-29: qty 2

## 2014-12-29 MED ORDER — LIDOCAINE HCL (PF) 2 % IJ SOLN
0.0000 mL | Freq: Once | INTRAMUSCULAR | Status: DC | PRN
  Filled 2014-12-29: qty 20
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
## 2014-12-29 NOTE — Progress Notes (Signed)
Physical Therapy Session Note  Patient Details  Name: Caleb [name] MRN: [id number] Date of Birth: Jan 07, 1991  Today's Date: 12/29/2014 PT Individual Time: 1330-1425 PT Individual Time Calculation (min): 55 min   Short Term Goals: Week 1:  PT Short Term Goal 1 (Week 1): STGs=LTGs due to short ELOS  Skilled Therapeutic Interventions/Progress Updates:   Pt received in supine in bed and agreeable to therapy, though reporting some fatigue.  Session focused on gait training with axillary crutches and LE strengthening.  Pt amb to/from therapy gym x200' with axillary crutches and no rest breaks.  Initial steady assist for amb, progressing to close supervision.  PT instructed patient in therex 2x15 reps for seated hip flexion bilat and RLE LAQ (pt requiring multiple rest breaks to complete a set of 15 on RLE). PT instructed patient in 3x5 supine bridging and x15 reps bilaterally for side lying hip flexion, abd, and extension.  Pt positioned supine in bed at end of session and PT provided education on wrapping and edema.  Pt left in bed with call bell in reach and needs met.   Therapy Documentation Precautions:  Precautions Precautions: Fall Precaution Comments: new L AKA Restrictions Weight Bearing Restrictions: Yes LLE Weight Bearing: Non weight bearing Pain: Pain Assessment Pain Assessment: 0-10 Pain Score: 0-No pain   See Function Navigator for Current Functional Status.   Therapy/Group: Individual Therapy  Earnest Conroy Penven-Crew 12/29/2014, 4:38 PM
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
## 2014-12-29 NOTE — Progress Notes (Signed)
  [facility]  CC:  Catheter removal   HPI:  This is a 24 y.o. male here for diatek catheter removal.  His kidney function is improving and no longer needs dialysis.    No past medical history on file.  FH:  Non-Contributory  Social History   Social History  . Marital Status: Single    Spouse Name: N/A  . Number of Children: N/A  . Years of Education: N/A   Occupational History  . Not on file.   Social History Main Topics  . Smoking status: Never Smoker   . Smokeless tobacco: Not on file  . Alcohol Use: 0.0 oz/week    0 Standard drinks or equivalent per week  . Drug Use: No  . Sexual Activity: Not on file   Other Topics Concern  . Not on file   Social History Narrative   ** Merged History Encounter **       ** Merged History Encounter **        Allergies  Allergen Reactions  . Other Itching and Rash    Tape or gauze or plastic wound dressings.      Current Facility-Administered Medications  Medication Dose Route Frequency Provider Last Rate Last Dose  . acetaminophen (TYLENOL) solution 650 mg  650 mg Oral Q6H PRN Lavon Paganini Angiulli, PA-C      . ciprofloxacin (CIPRO) tablet 500 mg  500 mg Oral Q breakfast Lavon Paganini Angiulli, PA-C   500 mg at 12/29/14 0748  . feeding supplement (NEPRO CARB STEADY) liquid 237 mL  237 mL Oral BID BM Daniel J Angiulli, PA-C   237 mL at 12/28/14 1000  . heparin injection 5,000 Units  5,000 Units Subcutaneous 3 times per day Gabriel Earing, PA-C      . LORazepam (ATIVAN) tablet 1 mg  1 mg Oral Q4H PRN Lavon Paganini Angiulli, PA-C       Or  . LORazepam (ATIVAN) injection 1 mg  1 mg Intramuscular Q4H PRN Lavon Paganini Angiulli, PA-C      . ondansetron (ZOFRAN) tablet 4 mg  4 mg Oral Q6H PRN Lavon Paganini Angiulli, PA-C       Or  . ondansetron (ZOFRAN) injection 4 mg  4 mg Intravenous Q6H PRN Lavon Paganini Angiulli, PA-C      . oxyCODONE (Oxy IR/ROXICODONE) immediate release tablet 5-15 mg  5-15 mg Oral Q4H PRN Lavon Paganini Angiulli,  PA-C   10 mg at 12/29/14 0748  . sorbitol 70 % solution 30 mL  30 mL Oral Daily PRN Daniel J Angiulli, PA-C        ROS:  See HPI  PHYSICAL EXAM  Filed Vitals:   12/29/14 0440  BP: 123/81  Pulse: 80  Temp: 98.6 F (37 C)  Resp: 16    Gen:  Well developed well nourished HEENT:  normocephalic Neck:  Left IJ TDC Lungs:  Non-labored Extremities:  Left AKA Skin:  No obvious rashes Neuro:  No focal deficits  Impression: This is a 24 y.o. male here for diatek catheter removal  Plan:  Removal of left diatek catheter  Virgina Jock, PA-C [facility] [phone] 12/29/2014 1:27 PM
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
## 2014-12-29 NOTE — Progress Notes (Signed)
Physical Therapy Session Note  Patient Details  Name: Caleb [name] MRN: [id number] Date of Birth: 05-24-1990  Today's Date: 12/29/2014 PT Individual Time: 1605-1700 PT Individual Time Calculation (min): 55 min   Short Term Goals: Week 1:  PT Short Term Goal 1 (Week 1): STGs=LTGs due to short ELOS  Skilled Therapeutic Interventions/Progress Updates:   Session focused on functional mobility training, standing balance, and standing tolerance. Patient ambulated to therapy gym using crutches x 200 ft with close supervision and 2 standing rest breaks. In stairwell, patient attempted using R rail and L crutch x 1 step while therapist still demonstrating technique, requiring max A to prevent fall with LOB forward. After seated rest break, patient negotiated up/down 3 steps with min A overall and max A on last step due to RLE fatigue, ascending forwards and descending backwards. Patient participated in game of Wii baseball and Wii tennis in standing with use of RW up to 5 min at a time before requiring seated rest break, supervision. Patient propelled wheelchair back to room and left semi reclined in bed with all needs within reach.     Therapy Documentation Precautions:  Precautions Precautions: Fall Precaution Comments: new L AKA Restrictions Weight Bearing Restrictions: Yes LLE Weight Bearing: Non weight bearing Pain: Pain Assessment Pain Assessment: No/denies pain Pain Score: 0-No pain   See Function Navigator for Current Functional Status.   Therapy/Group: Individual Therapy  Laretta Alstrom 12/29/2014, 5:15 PM

## 2014-12-29 NOTE — Progress Notes (Signed)
Occupational Therapy Session Note  Patient Details  Name: Caleb [name] MRN: [id number] Date of Birth: 1990-11-19  Today's Date: 12/29/2014 OT Individual Time: 1430-1500 OT Individual Time Calculation (min): 30 min    Short Term Goals: Week 1:  OT Short Term Goal 1 (Week 1): STGs=LTGs secondary to estimated short LOS  Skilled Therapeutic Interventions/Progress Updates:    Pt seen for OT session focusing on IADL re-training. Pt in supine upon arrival, agreeable to tx session. He ambulated throughout unit on crutches with supervision, requiring seated and standing rest breaks due to fatigue. He completed simulated laundry task in standing, replacing/removing items from washer and dryer- completed with overall supervision with VCs provided for problem solving. He ambulated back to room at end of session, left in supine with all needs in reach.  Education provided throughout session regarding phantom limb pain, continuum of care, and d/c planning.   Therapy Documentation Precautions:  Precautions Precautions: Fall Precaution Comments: new L AKA Restrictions Weight Bearing Restrictions: Yes LLE Weight Bearing: Non weight bearing Pain:   Did not formally assess pain, however, voiced some phantom pain in LLE  See Function Navigator for Current Functional Status.   Therapy/Group: Individual Therapy  Lewis, Shrita Thien C 12/29/2014, 7:17 AM

## 2014-12-29 NOTE — Care Management Note (Signed)
Inpatient [facility] Individual Statement of Services  Patient Name:  Caleb [name]  Date:  12/29/2014  Welcome to the [facility].  Our goal is to provide you with an individualized program based on your diagnosis and situation, designed to meet your specific needs.  With this comprehensive rehabilitation program, you will be expected to participate in at least 3 hours of rehabilitation therapies Monday-Friday, with modified therapy programming on the weekends.  Your rehabilitation program will include the following services:  Physical Therapy (PT), Occupational Therapy (OT), 24 hour per day rehabilitation nursing, Therapeutic Recreaction (TR), Neuropsychology, Case Management (Social Worker), Rehabilitation Medicine, Nutrition Services and Pharmacy Services  Weekly team conferences will be held on Tuesdays to discuss your progress.  Your Social Worker will talk with you frequently to get your input and to update you on team discussions.  Team conferences with you and your family in attendance may also be held.  Expected length of stay: 7-9 days       Overall anticipated outcome:  Modified independent  Depending on your progress and recovery, your program may change. Your Social Worker will coordinate services and will keep you informed of any changes. Your Social Worker's name and contact numbers are listed  below.  The following services may also be recommended but are not provided by the [facility] will be made to provide these services after discharge if needed.  Arrangements include referral to agencies that provide these services.  Your insurance has been verified to be:  [employer] Your primary doctor is:  Dr. Alyson Ingles  Pertinent information will be shared with your doctor and your  insurance company.  Social Worker:  [place], [place] or ([phone]   Information discussed with and copy given to patient by: Lennart Pall, 12/29/2014, 8:50 AM

## 2014-12-29 NOTE — Progress Notes (Signed)
  [facility] Catheter Removal Procedure Note  Diagnosis: ESRD  Plan:  Remove left diatek catheter  Consent signed:  Yes.   Time out completed:  Yes.   Coumadin:  No.   Procedure: 1.  Sterile prepping and draping over catheter area 2. 0 ml 2% lidocaine plain instilled at removal site. 3.  left catheter removed in its entirety with cuff in tact. 4.  Complications:  none 5. Tip of catheter sent for culture:  No.   Patient tolerated procedure well:  Yes.   Pressure held, no bleeding noted, dressing applied Instructions given to the pt regarding wound care and bleeding.    Virgina Jock, PA-C 12/29/2014 1:28 PM

## 2014-12-29 NOTE — Progress Notes (Signed)
Occupational Therapy Session Note  Patient Details  Name: Caleb [name] MRN: [id number] Date of Birth: 08/29/1990  Today's Date: 12/29/2014 OT Individual Time: 1100-1159 OT Individual Time Calculation (min): 59 min    Short Term Goals: Week 1:  OT Short Term Goal 1 (Week 1): STGs=LTGs secondary to estimated short LOS  Skilled Therapeutic Interventions/Progress Updates:  Upon entering the room, pt seated on EOB with no c/o pain this session. Pt dressed UB in seated position with set up A and verbal cues for lateral leans to don pull over pants over B hips. Pt required steady assist for safety when reaching down to don R shoe. Stand pivot transfer with RW and steady assist into wheelchair. Pt propelling wheelchair 300'+ onto elevator and outside with supervision. Pt ambulating 150' outside on variety of uneven surfaces as well as up and down incline with RW and steady assist. Pt fatigued and requiring rest break after ambulation. Pt also ambulating into gift shop with RW through aisles and tight spaces with education on turning RW sideways to get through smaller spaces. Pt required steady assist for task. Pt utilizing L rail and R support from therapist to hop up 5 steps and back down with min A for balance. Pt propelling wheelchair back to room in same manner as stated above. Pt performing scoot pivot wheelchair>bed with steady assist. Call bell and all needed items within reach upon exiting the room.   Therapy Documentation Precautions:  Precautions Precautions: Fall Precaution Comments: new L AKA Restrictions Weight Bearing Restrictions: Yes LLE Weight Bearing: Non weight bearing  See Function Navigator for Current Functional Status.   Therapy/Group: Individual Therapy  Phineas Semen 12/29/2014, 12:33 PM

## 2014-12-30 ENCOUNTER — Inpatient Hospital Stay (HOSPITAL_COMMUNITY): Payer: 59 | Admitting: Occupational Therapy

## 2014-12-30 ENCOUNTER — Inpatient Hospital Stay (HOSPITAL_COMMUNITY): Payer: 59 | Admitting: Physical Therapy

## 2014-12-30 ENCOUNTER — Inpatient Hospital Stay (HOSPITAL_COMMUNITY): Payer: 59

## 2014-12-30 LAB — RENAL FUNCTION PANEL
ALBUMIN: 2.6 g/dL — AB (ref 3.5–5.0)
ANION GAP: 10 (ref 5–15)
BUN: 22 mg/dL — AB (ref 6–20)
CHLORIDE: 98 mmol/L — AB (ref 101–111)
CO2: 28 mmol/L (ref 22–32)
Calcium: 9.1 mg/dL (ref 8.9–10.3)
Creatinine, Ser: 2.62 mg/dL — ABNORMAL HIGH (ref 0.61–1.24)
GFR calc Af Amer: 38 mL/min — ABNORMAL LOW (ref >60)
GFR, EST NON AFRICAN AMERICAN: 33 mL/min — AB (ref >60)
Glucose, Bld: 93 mg/dL (ref 65–99)
PHOSPHORUS: 4.3 mg/dL (ref 2.5–4.6)
POTASSIUM: 3.1 mmol/L — AB (ref 3.5–5.1)
Sodium: 136 mmol/L (ref 135–145)

## 2014-12-30 MED ORDER — POTASSIUM CHLORIDE CRYS ER 20 MEQ PO TBCR
40.0000 meq | EXTENDED_RELEASE_TABLET | Freq: Two times a day (BID) | ORAL | Status: AC
  Administered 2014-12-30 (×2): 40 meq via ORAL
  Filled 2014-12-30 (×3): qty 2
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
## 2014-12-30 NOTE — Progress Notes (Signed)
Occupational Therapy Note  Patient Details  Name: Caleb [name] MRN: [id number] Date of Birth: 05-07-1990  Today's Date: 12/30/2014 OT Individual Time: 1330-1430 OT Individual Time Calculation (min): 60 min   Pt denied pain Individual Therapy  Pt seen for skilled OT services with focus on functional amb with RW, dynamic standing balance with and without RW, simulated tub bench transfers, and w/c mobility.  Pt engaged in functional amb with RW to day room to engaged in Wii game of bowling X 2.  Pt stood approx 90% of time with short periods (3) of standing without BUE support while participating in Wii bowling.  Pt amb with RW to ADL apartment and practiced tub bench transfers.  Pt did not require any physical assistance throughout session.  Pt stood X 1 before locking brakes on w/c  Educated pt w/c safety and importance of locking brakes when not moving.  Pt verbalized understanding.   Leotis Shames [facility] 12/30/2014, 2:50 PM
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
## 2014-12-30 NOTE — Progress Notes (Addendum)
Physical Therapy Session Note  Patient Details  Name: Caleb [name] MRN: [id number] Date of Birth: Jul 27, 1990  Today's Date: 12/30/2014 PT Individual Time: 0800-0900 PT Individual Time Calculation (min): 60 min   Short Term Goals: Week 1:  PT Short Term Goal 1 (Week 1): STGs=LTGs due to short ELOS  Skilled Therapeutic Interventions/Progress Updates:   Patient and girlfriend asleep in room, required increased time and max verbal cues for arousal with patient stating he didn't get any sleep last night. Patient eventually sat edge of bed to don shirt and shorts using RW with sit <> stand and supervision. Patient elected to use RW to ambulate approx 40 ft with supervision before requesting to propel wheelchair rest of way, mod I. Supine/sidelying/prone BLE therex for strengthening and ROM: glute sets x 20, hip flexion/extension/abduction 2 x 10, attempted prone hip extension but unable to clear LLE from mat table, single leg bridging 2 x 10. Provided handout for HEP. Seated RLE therex with 3# wt: LAQ, hip flexion 2 x 10 each. Added 20 x 16 wheelchair cushion to wheelchair for improved sitting tolerance. Stair training to simulate home entry with initial demonstration, up/down 8 (6") stairs x 2 using R rail ascending with min A overall and seated rest break between. Patient ambulated back to room using RW with supervision and one seated rest break. Patient left sitting edge of bed with breakfast tray setup and all needs within reach.     Therapy Documentation Precautions:  Precautions Precautions: Fall Precaution Comments: new L AKA Restrictions Weight Bearing Restrictions: Yes LLE Weight Bearing: Non weight bearing Pain: Pain Assessment Pain Assessment: No/denies pain    See Function Navigator for Current Functional Status.   Therapy/Group: Individual Therapy  Laretta Alstrom 12/30/2014, 8:48 AM

## 2014-12-30 NOTE — Progress Notes (Signed)
Occupational Therapy Session Note  Patient Details  Name: Caleb [name] MRN: [id number] Date of Birth: 1990/10/24  Today's Date: 12/30/2014 OT Individual Time:  -   0900-1000  (60 min)  1st session                                           1630-1700  (30 min)   2nd session      Short Term Goals: Week 1:  OT Short Term Goal 1 (Week 1): STGs=LTGs secondary to estimated short LOS Week 2:    Week 3:     Skilled Therapeutic Interventions/Progress Updates:    1st session:  Skilled OT intervention with treatment focus on the following:    pt seated on EOB with no c/o pain this session. Pt agreed to ambulate to shower and perform shower at seated level. Performed bathing with close supervision.   Pt dressed UB in seated in wc position with set up A and verbal cues for lateral leans to don pull over pants over B hips.   . 2nd session:  Stand pivot transfer with RW and steady assist into wheelchair.  Pt. Propelled to gym.  Used sci fit in standing for 5 minutes at 5 wkload.  Used Push up blocks on EOB for 10x for 2 sets.  Instructed and performed 5 and 6 # weights on BUE for biceps, deltoids, triceps. Pt used theraband with instructional cues for UE.  [name] Mom, grandmother, cousin present.  Pt. Propelled wc to room and left with all needs in reach.     Therapy Documentation Precautions:  Precautions Precautions: Fall Precaution Comments: new L AKA Restrictions Weight Bearing Restrictions: Yes LLE Weight Bearing: Non weight bearing      Pain:  4/10  LLE          See Function Navigator for Current Functional Status.   Therapy/Group: Individual Therapy  Lisa Roca 12/30/2014, 7:44 AM

## 2014-12-31 ENCOUNTER — Inpatient Hospital Stay (HOSPITAL_COMMUNITY): Payer: 59 | Admitting: Physical Therapy

## 2014-12-31 ENCOUNTER — Inpatient Hospital Stay (HOSPITAL_COMMUNITY): Payer: Self-pay | Admitting: Occupational Therapy

## 2014-12-31 DIAGNOSIS — S31109D Unspecified open wound of abdominal wall, unspecified quadrant without penetration into peritoneal cavity, subsequent encounter: Secondary | ICD-10-CM

## 2014-12-31 DIAGNOSIS — W3400XD Accidental discharge from unspecified firearms or gun, subsequent encounter: Secondary | ICD-10-CM

## 2014-12-31 NOTE — Progress Notes (Signed)
Occupational Therapy Session Note  Patient Details  Name: Caleb [name] MRN: [id number] Date of Birth: [birth year]  Today's Date: 12/31/2014 OT Individual Time: 1450-1535 OT Individual Time Calculation (min): 45 min    Short Term Goals: Week 1:  OT Short Term Goal 1 (Week 1): STGs=LTGs secondary to estimated short LOS  Skilled Therapeutic Interventions/Progress Updates: For afternoon session, patient seen for w/c propulsion and Upper body strengthening and stabilization.   Nurse informed this clincian of medical problems with left residual limb and advised patient not participate in therapy that will put pressure or decreased integrity on patient's left residual limb    Therapy Documentation Precautions:  Precautions Precautions: Fall Precaution Comments: new L AKA Restrictions Weight Bearing Restrictions: Yes LLE Weight Bearing: Non weight bearing Pain: Pain Assessment Pain Assessment: No/denies pain  See Function Navigator for Current Functional Status.   Therapy/Group: Individual Therapy  Alfredia Ferguson [facility] 12/31/2014, 6:53 PM
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
## 2014-12-31 NOTE — Progress Notes (Signed)
Patient ID: Caleb [name], male   DOB: 1990-11-20, 24 y.o.   MRN: [id number] Has had separation of central portion of L AKA wd. Will start dressing changes.  Will need debridement in or on Monday.  Discussed with patient

## 2014-12-31 NOTE — Progress Notes (Signed)
Occupational Therapy Session Note  Patient Details  Name: Caleb [name] MRN: [id number] Date of Birth: 01-Nov-1990  Today's Date: 12/31/2014 OT Individual Time:  - 409-811 total for a total of 60 minutes      Short Term Goals: Week 1:  OT Short Term Goal 1 (Week 1): STGs=LTGs secondary to estimated short LOS  Skilled Therapeutic Interventions/Progress Updates: Patient seen for in room shower via shower chair with back with focus on dynamic balance sitting and safety  With transfers.    Patient elected to sit on shower chair for shower as he stated that would safer for him and that he  Can lean laterally for washing and rinsing periarea.  Overall patient required close S for transfers and washing in the wet shower area.  He was able to complete self dressing sitting edge of bed and completing lateral leans when needed.     Therapy Documentation Precautions:  Precautions Precautions: Fall Precaution Comments: new L AKA Restrictions Weight Bearing Restrictions: Yes LLE Weight Bearing: Non weight bearing   Pain: Pain Assessment Pain Assessment: No/denies pain  See Function Navigator for Current Functional Status.   Therapy/Group: Individual Therapy  Alfredia Ferguson [facility] 12/31/2014, 6:45 PM

## 2014-12-31 NOTE — Progress Notes (Signed)
Physical Therapy Session Note  Patient Details  Name: Caleb [name] MRN: [id number] Date of Birth: 1990/05/30  Today's Date: 12/31/2014 PT Individual Time: 5993-5701 and 1500-1530  PT Individual Time Calculation (min): 56 min and 30 min  Short Term Goals: Week 1:  PT Short Term Goal 1 (Week 1): STGs=LTGs due to short ELOS  Skilled Therapeutic Interventions/Progress Updates:   Session 1: Pt received sleeping in bed, easily awoken to tactile stimuli, and agreeable to therapy. Pt transitioned to sitting EOB and dressed mod I.  Pt amb to therapy gym with crutches and supervision with 2 standing rest breaks.  Pt wanting to try stair negotiation without AD, using BUE support on R handrail; PT instructed patient in 10 step negotiation in stairwell with 1 step attempt using two hands on R handrail with LOB anteriorly requiring max assist to recover.  Pt negotiated remaining 9 steps with L crutch and R handrail.  Pt required initial mod assist faded to steady assist for ascent and mod assist for 1st step down faded to steady assist for remaining 9 steps.  Pt expressed feeling "amazed" at how different everything was and how much more difficult daily activities were following his amputation.  PT provided emotional support as needed and education in energy conservation techniques as well as safe building of endurance.  PT instructed patient in dynamic sitting balance and strengthening activity with 5# ball throwing/catching from trampoline.  Pt had 2 LOB to L side but able to self correct.  Pt propelled w/c back to room mod I and transferred to bed supervision.  Positioned sitting EOB with call bell in reach and needs met   Session 2: RN reporting AKA wound partial dehisc and dead muscle tissue breaking through wound; per RN, surgeon cleared patient for "non-strenuous" therapies this PM.  Session focused on community level w/c mobility.  Pt propelled w/c >300' and is able to turn around, maneuver to table, bed  or toilet, maneuver on rugs and over door sills, and can negotiate a 3% grade modified independent. Pt returned to room at end of session and positioned supine in bed with call bell in reach and needs met.     Therapy Documentation Precautions:  Precautions Precautions: Fall Precaution Comments: new L AKA Restrictions Weight Bearing Restrictions: Yes LLE Weight Bearing: Non weight bearing Pain: Pain Assessment Pain Assessment: No/denies pain   See Function Navigator for Current Functional Status.   Therapy/Group: Individual Therapy  Parisha Beaulac E Penven-Crew 12/31/2014, 11:30 AM

## 2015-01-01 ENCOUNTER — Inpatient Hospital Stay (HOSPITAL_COMMUNITY): Payer: 59 | Admitting: Occupational Therapy

## 2015-01-01 LAB — BASIC METABOLIC PANEL
ANION GAP: 13 (ref 5–15)
BUN: 11 mg/dL (ref 6–20)
CALCIUM: 9.5 mg/dL (ref 8.9–10.3)
CO2: 25 mmol/L (ref 22–32)
CREATININE: 1.49 mg/dL — AB (ref 0.61–1.24)
Chloride: 101 mmol/L (ref 101–111)
Glucose, Bld: 94 mg/dL (ref 65–99)
Potassium: 3.3 mmol/L — ABNORMAL LOW (ref 3.5–5.1)
SODIUM: 139 mmol/L (ref 135–145)

## 2015-01-01 MED ORDER — POTASSIUM CHLORIDE CRYS ER 20 MEQ PO TBCR
20.0000 meq | EXTENDED_RELEASE_TABLET | Freq: Once | ORAL | Status: AC
  Administered 2015-01-01: 20 meq via ORAL
  Filled 2015-01-01: qty 1
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
## 2015-01-01 NOTE — Progress Notes (Signed)
Occupational Therapy Session Note  Patient Details  Name: Caleb [name] MRN: [id number] Date of Birth: 1990-10-17  Today's Date: 01/01/2015 OT Individual Time:  -   1445-1540  (3 min)      Short Term Goals: Week 1:  OT Short Term Goal 1 (Week 1): STGs=LTGs secondary to estimated short LOS  Skilled Therapeutic Interventions/Progress Updates:    Pt. Sitting in bed.  Transferred from bed to wc with SBA Propelled wc to ortho gym.  Did UE strengthening exercises using cable weights.  Engaged in 3 sets of 15 reps with shoulder flexion, elbow flexion, elbow extension, and shoulder abduction.  Did internal/external rotation with 5 # weights.  Pt. Propelled wc about 300 feet back to room Transferred to bed.  Left with family, Mom in room.    Therapy Documentation Precautions:  Precautions Precautions: Fall Precaution Comments: new L AKA Restrictions Weight Bearing Restrictions: Yes LLE Weight Bearing: Non weight bearing    Vital Signs: Therapy Vitals Temp: 98.3 F (36.8 C) Temp Source: Oral Pulse Rate: 92 Resp: 18 BP: (!) 129/94 mmHg Patient Position (if appropriate): Sitting Oxygen Therapy SpO2: 100 % O2 Device: Not Delivered Pain:  4/10 left residual limb          :    See Function Navigator for Current Functional Status.   Therapy/Group: Individual Therapy  Lisa Roca 01/01/2015, 3:41 PM

## 2015-01-02 ENCOUNTER — Inpatient Hospital Stay (HOSPITAL_COMMUNITY): Payer: 59

## 2015-01-02 ENCOUNTER — Inpatient Hospital Stay (HOSPITAL_COMMUNITY): Payer: 59 | Admitting: Anesthesiology

## 2015-01-02 ENCOUNTER — Inpatient Hospital Stay (HOSPITAL_COMMUNITY): Payer: Self-pay | Admitting: Occupational Therapy

## 2015-01-02 ENCOUNTER — Encounter (HOSPITAL_COMMUNITY): Payer: Self-pay

## 2015-01-02 ENCOUNTER — Inpatient Hospital Stay (HOSPITAL_COMMUNITY): Payer: Self-pay | Admitting: Physical Therapy

## 2015-01-02 ENCOUNTER — Ambulatory Visit: Admit: 2015-01-02 | Payer: 59 | Admitting: Vascular Surgery

## 2015-01-02 ENCOUNTER — Encounter (HOSPITAL_COMMUNITY)
Admission: RE | Disposition: A | Discharge status: Home / Self Care | Payer: Self-pay | Source: Intra-hospital | Attending: Physical Medicine & Rehabilitation

## 2015-01-02 ENCOUNTER — Inpatient Hospital Stay (HOSPITAL_COMMUNITY)
Admission: RE | Admit: 2015-01-02 | Discharge: 2015-01-04 | DRG: 475 | Disposition: A | Discharge status: Rehabilitation Facility | Payer: 59 | Source: Other Acute Inpatient Hospital | Attending: Vascular Surgery | Admitting: Vascular Surgery

## 2015-01-02 DIAGNOSIS — T8781 Dehiscence of amputation stump: Secondary | ICD-10-CM | POA: Diagnosis present

## 2015-01-02 DIAGNOSIS — E876 Hypokalemia: Secondary | ICD-10-CM | POA: Diagnosis present

## 2015-01-02 DIAGNOSIS — D62 Acute posthemorrhagic anemia: Secondary | ICD-10-CM | POA: Diagnosis not present

## 2015-01-02 DIAGNOSIS — Z89612 Acquired absence of left leg above knee: Secondary | ICD-10-CM

## 2015-01-02 DIAGNOSIS — W3400XA Accidental discharge from unspecified firearms or gun, initial encounter: Secondary | ICD-10-CM

## 2015-01-02 DIAGNOSIS — N178 Other acute kidney failure: Secondary | ICD-10-CM | POA: Diagnosis not present

## 2015-01-02 HISTORY — PX: AMPUTATION: SHX166

## 2015-01-02 HISTORY — PX: I & D EXTREMITY: SHX5045

## 2015-01-02 SURGERY — IRRIGATION AND DEBRIDEMENT EXTREMITY
Anesthesia: General | Site: Leg Upper | Laterality: Left

## 2015-01-02 MED ORDER — 0.9 % SODIUM CHLORIDE (POUR BTL) OPTIME
TOPICAL | Status: DC | PRN
  Administered 2015-01-02: 1000 mL

## 2015-01-02 MED ORDER — HYDROMORPHONE HCL 1 MG/ML IJ SOLN: 0.2500 mg | INTRAMUSCULAR | Status: DC | PRN

## 2015-01-02 MED ORDER — LIDOCAINE HCL (CARDIAC) 20 MG/ML IV SOLN
INTRAVENOUS | Status: DC | PRN
  Administered 2015-01-02: 60 mg via INTRAVENOUS

## 2015-01-02 MED ORDER — MIDAZOLAM HCL 2 MG/2ML IJ SOLN
INTRAMUSCULAR | Status: AC
  Filled 2015-01-02: qty 4

## 2015-01-02 MED ORDER — LACTATED RINGERS IV SOLN
INTRAVENOUS | Status: DC | PRN
  Administered 2015-01-02 (×3): via INTRAVENOUS

## 2015-01-02 MED ORDER — METOPROLOL TARTRATE 1 MG/ML IV SOLN: 2.0000 mg | INTRAVENOUS | Status: DC | PRN

## 2015-01-02 MED ORDER — CEPHALEXIN 500 MG PO CAPS
500.0000 mg | ORAL_CAPSULE | Freq: Three times a day (TID) | ORAL | Status: DC
  Administered 2015-01-03 – 2015-01-04 (×4): 500 mg via ORAL
  Filled 2015-01-02 (×4): qty 1

## 2015-01-02 MED ORDER — HYDRALAZINE HCL 20 MG/ML IJ SOLN: 5.0000 mg | INTRAMUSCULAR | Status: DC | PRN

## 2015-01-02 MED ORDER — SODIUM CHLORIDE 0.9 % IJ SOLN
3.0000 mL | Freq: Two times a day (BID) | INTRAMUSCULAR | Status: DC
  Administered 2015-01-03 – 2015-01-04 (×4): 3 mL via INTRAVENOUS

## 2015-01-02 MED ORDER — SODIUM CHLORIDE 0.9 % IR SOLN
Status: DC | PRN
  Administered 2015-01-02: 1000 mL

## 2015-01-02 MED ORDER — PROPOFOL 10 MG/ML IV BOLUS
INTRAVENOUS | Status: AC
  Filled 2015-01-02: qty 20

## 2015-01-02 MED ORDER — ONDANSETRON HCL 4 MG/2ML IJ SOLN: 4.0000 mg | Freq: Once | INTRAMUSCULAR | Status: DC | PRN

## 2015-01-02 MED ORDER — SODIUM CHLORIDE 0.9 % IV SOLN: 250.0000 mL | INTRAVENOUS | Status: DC | PRN

## 2015-01-02 MED ORDER — HYDROMORPHONE HCL 1 MG/ML IJ SOLN
0.2500 mg | INTRAMUSCULAR | Status: DC | PRN
  Administered 2015-01-02 (×2): 0.5 mg via INTRAVENOUS

## 2015-01-02 MED ORDER — OXYCODONE HCL 5 MG/5ML PO SOLN: 5.0000 mg | Freq: Once | ORAL | Status: AC | PRN

## 2015-01-02 MED ORDER — FENTANYL CITRATE (PF) 250 MCG/5ML IJ SOLN
INTRAMUSCULAR | Status: AC
  Filled 2015-01-02: qty 5

## 2015-01-02 MED ORDER — ACETAMINOPHEN 10 MG/ML IV SOLN
1000.0000 mg | INTRAVENOUS | Status: DC
  Filled 2015-01-02: qty 100

## 2015-01-02 MED ORDER — KETAMINE HCL 100 MG/ML IJ SOLN
INTRAMUSCULAR | Status: DC | PRN
  Administered 2015-01-02 (×2): 50 mg via INTRAVENOUS

## 2015-01-02 MED ORDER — HYDROMORPHONE HCL 1 MG/ML IJ SOLN
INTRAMUSCULAR | Status: AC
Start: 2015-01-02 — End: 2015-01-02
  Filled 2015-01-02: qty 1

## 2015-01-02 MED ORDER — HYDROMORPHONE HCL 1 MG/ML IJ SOLN
INTRAMUSCULAR | Status: DC | PRN
  Administered 2015-01-02 (×2): 0.5 mg via INTRAVENOUS
  Administered 2015-01-02: .5 mg via INTRAVENOUS
  Administered 2015-01-02: 0.5 mg via INTRAVENOUS

## 2015-01-02 MED ORDER — OXYCODONE HCL 5 MG PO TABS
5.0000 mg | ORAL_TABLET | Freq: Once | ORAL | Status: AC | PRN
Start: 2015-01-02 — End: 2015-01-02
  Administered 2015-01-02: 5 mg via ORAL

## 2015-01-02 MED ORDER — HEPARIN SODIUM (PORCINE) 5000 UNIT/ML IJ SOLN
5000.0000 [IU] | Freq: Three times a day (TID) | INTRAMUSCULAR | Status: DC
  Administered 2015-01-03: 5000 [IU] via SUBCUTANEOUS
  Filled 2015-01-02: qty 1

## 2015-01-02 MED ORDER — ONDANSETRON HCL 4 MG/2ML IJ SOLN
INTRAMUSCULAR | Status: DC | PRN
  Administered 2015-01-02: 4 mg via INTRAVENOUS

## 2015-01-02 MED ORDER — POTASSIUM CHLORIDE CRYS ER 20 MEQ PO TBCR: 20.0000 meq | EXTENDED_RELEASE_TABLET | Freq: Every day | ORAL | Status: DC | PRN

## 2015-01-02 MED ORDER — ONDANSETRON HCL 4 MG/2ML IJ SOLN
INTRAMUSCULAR | Status: AC
  Filled 2015-01-02: qty 2

## 2015-01-02 MED ORDER — PROPOFOL 10 MG/ML IV BOLUS
INTRAVENOUS | Status: DC | PRN
  Administered 2015-01-02: 200 mg via INTRAVENOUS

## 2015-01-02 MED ORDER — FERROUS SULFATE 325 (65 FE) MG PO TABS
325.0000 mg | ORAL_TABLET | Freq: Every day | ORAL | Status: DC
  Administered 2015-01-03 – 2015-01-04 (×2): 325 mg via ORAL
  Filled 2015-01-02 (×2): qty 1

## 2015-01-02 MED ORDER — MORPHINE SULFATE (PF) 2 MG/ML IV SOLN: 2.0000 mg | INTRAVENOUS | Status: DC | PRN

## 2015-01-02 MED ORDER — SODIUM CHLORIDE 0.9 % IJ SOLN: 3.0000 mL | INTRAMUSCULAR | Status: DC | PRN

## 2015-01-02 MED ORDER — OXYCODONE HCL 5 MG/5ML PO SOLN: 5.0000 mg | Freq: Once | ORAL | Status: DC | PRN

## 2015-01-02 MED ORDER — OXYCODONE HCL 5 MG PO TABS: 5.0000 mg | ORAL_TABLET | Freq: Once | ORAL | Status: DC | PRN

## 2015-01-02 MED ORDER — GUAIFENESIN-DM 100-10 MG/5ML PO SYRP: 15.0000 mL | ORAL_SOLUTION | ORAL | Status: DC | PRN

## 2015-01-02 MED ORDER — DEXTROSE 5 % IV SOLN
1.5000 g | Freq: Two times a day (BID) | INTRAVENOUS | Status: AC
  Administered 2015-01-02 – 2015-01-03 (×2): 1.5 g via INTRAVENOUS
  Filled 2015-01-02 (×2): qty 1.5

## 2015-01-02 MED ORDER — HYDROMORPHONE HCL 1 MG/ML IJ SOLN
INTRAMUSCULAR | Status: AC
  Filled 2015-01-02: qty 1

## 2015-01-02 MED ORDER — MIDAZOLAM HCL 5 MG/5ML IJ SOLN
INTRAMUSCULAR | Status: DC | PRN
  Administered 2015-01-02: 2 mg via INTRAVENOUS

## 2015-01-02 MED ORDER — LACTATED RINGERS IV SOLN
INTRAVENOUS | Status: DC
  Administered 2015-01-02: 09:00:00 via INTRAVENOUS

## 2015-01-02 MED ORDER — OXYCODONE HCL 5 MG PO TABS
ORAL_TABLET | ORAL | Status: AC
  Filled 2015-01-02: qty 1

## 2015-01-02 MED ORDER — KETAMINE HCL 100 MG/ML IJ SOLN
INTRAMUSCULAR | Status: AC
  Filled 2015-01-02: qty 1

## 2015-01-02 MED ORDER — DOCUSATE SODIUM 100 MG PO CAPS
100.0000 mg | ORAL_CAPSULE | Freq: Every day | ORAL | Status: DC
  Administered 2015-01-03: 100 mg via ORAL
  Filled 2015-01-02 (×2): qty 1

## 2015-01-02 MED ORDER — BISACODYL 10 MG RE SUPP: 10.0000 mg | Freq: Every day | RECTAL | Status: DC | PRN

## 2015-01-02 MED ORDER — LIDOCAINE HCL (CARDIAC) 20 MG/ML IV SOLN
INTRAVENOUS | Status: AC
  Filled 2015-01-02: qty 5

## 2015-01-02 MED ORDER — FENTANYL CITRATE (PF) 100 MCG/2ML IJ SOLN
INTRAMUSCULAR | Status: DC | PRN
  Administered 2015-01-02: 100 ug via INTRAVENOUS
  Administered 2015-01-02 (×2): 50 ug via INTRAVENOUS
  Administered 2015-01-02 (×5): 100 ug via INTRAVENOUS
  Administered 2015-01-02: 50 ug via INTRAVENOUS

## 2015-01-02 MED ORDER — PANTOPRAZOLE SODIUM 40 MG PO TBEC
40.0000 mg | DELAYED_RELEASE_TABLET | Freq: Every day | ORAL | Status: DC
  Administered 2015-01-02 – 2015-01-04 (×3): 40 mg via ORAL
  Filled 2015-01-02 (×4): qty 1

## 2015-01-02 MED ORDER — CEFAZOLIN SODIUM-DEXTROSE 2-3 GM-% IV SOLR
INTRAVENOUS | Status: DC | PRN
  Administered 2015-01-02: 2 g via INTRAVENOUS

## 2015-01-02 MED ORDER — LABETALOL HCL 5 MG/ML IV SOLN: 10.0000 mg | INTRAVENOUS | Status: DC | PRN

## 2015-01-02 MED ORDER — ACETAMINOPHEN 10 MG/ML IV SOLN
INTRAVENOUS | Status: DC | PRN
  Administered 2015-01-02: 1000 mg via INTRAVENOUS

## 2015-01-02 MED ORDER — PHENOL 1.4 % MT LIQD: 1.0000 | OROMUCOSAL | Status: DC | PRN

## 2015-01-02 MED ORDER — CEFAZOLIN SODIUM-DEXTROSE 2-3 GM-% IV SOLR
INTRAVENOUS | Status: AC
Start: 2015-01-02 — End: 2015-01-02
  Filled 2015-01-02: qty 50

## 2015-01-02 SURGICAL SUPPLY — 51 items
BANDAGE ELASTIC 4 VELCRO ST LF (GAUZE/BANDAGES/DRESSINGS) IMPLANT
BANDAGE ELASTIC 6 VELCRO ST LF (GAUZE/BANDAGES/DRESSINGS) ×2 IMPLANT
BLADE SAW RECIP 87.9 MT (BLADE) ×2 IMPLANT
BLADE SAW SAG 73X25 THK (BLADE) ×2
BLADE SAW SGTL 73X25 THK (BLADE) IMPLANT
BNDG GAUZE ELAST 4 BULKY (GAUZE/BANDAGES/DRESSINGS) ×2 IMPLANT
CANISTER SUCTION 2500CC (MISCELLANEOUS) ×3 IMPLANT
COUNTER NEEDLE 20 DBL MAG RED (NEEDLE) ×2 IMPLANT
COVER SURGICAL LIGHT HANDLE (MISCELLANEOUS) ×6 IMPLANT
DRAPE INCISE IOBAN 66X45 STRL (DRAPES) IMPLANT
DRAPE ORTHO SPLIT 77X108 STRL (DRAPES) ×3
DRAPE PROXIMA HALF (DRAPES) ×5 IMPLANT
DRAPE SURG ORHT 6 SPLT 77X108 (DRAPES) IMPLANT
DRSG ADAPTIC 3X8 NADH LF (GAUZE/BANDAGES/DRESSINGS) ×2 IMPLANT
ELECT REM PT RETURN 9FT ADLT (ELECTROSURGICAL) ×3
ELECTRODE REM PT RTRN 9FT ADLT (ELECTROSURGICAL) ×1 IMPLANT
GAUZE SPONGE 4X4 12PLY STRL (GAUZE/BANDAGES/DRESSINGS) ×3 IMPLANT
GLOVE BIO SURGEON STRL SZ 6.5 (GLOVE) ×2 IMPLANT
GLOVE BIO SURGEON STRL SZ7 (GLOVE) ×3 IMPLANT
GLOVE BIO SURGEONS STRL SZ 6.5 (GLOVE) ×2
GLOVE BIOGEL PI IND STRL 6.5 (GLOVE) IMPLANT
GLOVE BIOGEL PI IND STRL 7.0 (GLOVE) IMPLANT
GLOVE BIOGEL PI IND STRL 7.5 (GLOVE) ×1 IMPLANT
GLOVE BIOGEL PI INDICATOR 6.5 (GLOVE) ×6
GLOVE BIOGEL PI INDICATOR 7.0 (GLOVE) ×2
GLOVE BIOGEL PI INDICATOR 7.5 (GLOVE) ×2
GLOVE ECLIPSE 6.5 STRL STRAW (GLOVE) ×6 IMPLANT
GOWN STRL REUS W/ TWL LRG LVL3 (GOWN DISPOSABLE) ×3 IMPLANT
GOWN STRL REUS W/TWL LRG LVL3 (GOWN DISPOSABLE) ×12
HANDPIECE INTERPULSE COAX TIP (DISPOSABLE) ×3
KIT BASIN OR (CUSTOM PROCEDURE TRAY) ×3 IMPLANT
KIT REMOVER STAPLE SKIN (MISCELLANEOUS) ×2 IMPLANT
KIT ROOM TURNOVER OR (KITS) ×3 IMPLANT
NS IRRIG 1000ML POUR BTL (IV SOLUTION) ×3 IMPLANT
PACK GENERAL/GYN (CUSTOM PROCEDURE TRAY) IMPLANT
PAD ARMBOARD 7.5X6 YLW CONV (MISCELLANEOUS) ×6 IMPLANT
SET HNDPC FAN SPRY TIP SCT (DISPOSABLE) IMPLANT
SPONGE GAUZE 4X4 12PLY STER LF (GAUZE/BANDAGES/DRESSINGS) ×2 IMPLANT
SPONGE LAP 18X18 X RAY DECT (DISPOSABLE) ×10 IMPLANT
STAPLER VISISTAT 35W (STAPLE) ×2 IMPLANT
SUT BONE WAX W31G (SUTURE) ×2 IMPLANT
SUT ETHILON 3 0 PS 1 (SUTURE) IMPLANT
SUT MNCRL AB 4-0 PS2 18 (SUTURE) IMPLANT
SUT SILK 2 0 (SUTURE) ×6
SUT SILK 2-0 18XBRD TIE 12 (SUTURE) IMPLANT
SUT VIC AB 2-0 CT1 18 (SUTURE) ×10 IMPLANT
SUT VIC AB 2-0 CTX 36 (SUTURE) IMPLANT
SUT VIC AB 3-0 SH 27 (SUTURE)
SUT VIC AB 3-0 SH 27X BRD (SUTURE) IMPLANT
SUT VIC AB 3-0 SH 8-18 (SUTURE) ×4 IMPLANT
WATER STERILE IRR 1000ML POUR (IV SOLUTION) ×3 IMPLANT

## 2015-01-02 NOTE — Op Note (Addendum)
OPERATIVE NOTE   PROCEDURE: 1. Evacuation of hematoma left above-knee amputation stump 2. Excision of posterior thigh muscle 3. Redo left above-knee amputation   PRE-OPERATIVE DIAGNOSIS: dehiscence of left above-knee amputation incision  POST-OPERATIVE DIAGNOSIS: same as above  SURGEON: Adele Barthel, MD  ASSISTANT(S): Leontine Locket, PAC   ANESTHESIA: general  ESTIMATED BLOOD LOSS: 200 cc  FINDING(S): 1.  ~200-300 cc of hematoma in subfascial layer 2.  Dehiscence of medial 1/3 of fascial closure 3.  Ischemic muscle posterior medial 4.  No signs of infection or pus 5.  Viable muscle otherwise 6.  Calcification of periosteal tissue extending in lateral tissue 7.  No residual foreign body on left leg x-ray  SPECIMEN(S):  none  INDICATIONS:   Caleb [name] is a 24 y.o. male who presents with dehiscence of medial 1/3 of left AKA staple line over the weekend.  He was scheduled for a debridement of the left above-knee amputation stump.  I discussed in depth the nature of the procedure with the patient, including risks, benefits, and alternatives.  The patient is aware that the risks include but are not limited to: bleeding, infection, myocardial infarction, stroke, death, failure to heal amputation wound, and possible need for more proximal amputation.  The patient is aware of the risks and agrees proceed forward with the procedure.   DESCRIPTION: After obtaining full informed written consent, the patient was brought back to the operating room and placed supine upon the operating table.  The patient received IV antibiotics prior to induction.  After obtaining adequate anesthesia, the patient was prepped and draped in the standard fashion for: left above-knee amputation.  I started by evacuation of 200-300 cc of 1-3 day old clot.  There was no signs of infection or pus.  I washed out this hematoma cavity with 1 L of normal saline with a Pulsavac.  It rapidly became evident that  there was a large cavity behind the staple line that would compromise the long-term viability of this above-knee amputation stump.  I removed all the staples to facilitate redo of the above-knee amputation.  I marked out the incision for a fish-mouth amputation ~6 cm more proximal to the prior incision.  I made an incision and dissected through the subcutaneous tissue and muscle with electrocautery.  This was a tedious process as this patient has substantial venous hypertension.  I controlled bleeders with 3-0 Vicryl sutures and 2-0 silk ties.  Eventually, I successfully transected the muscle and subcutaneous tissue distal for a new above-knee amputation.  In this process, I encountered calcification of tendons and subcutaneous tissue lateral to the femur.  I dissected proximally ~8 cm proximal around the femur, releasing attachments to the bone.  At this point, the femur was transected with a power saw.  The first blade broke at the junction to the main body of the saw.   I had to finish the transection with a different saw.  I cut off some residual fragment of the femur with a rongeur.  I buffed down the bone with a rasp.  I then spent 30 minutes tying off bleeders and suture ligating big vessel complexes.  I had to pack bone wax into the shaft of the femur to control some of the bleeding there.  I washed off the new above-knee amputation stump and no further active bleeding was present.  I debrided some the posterior muscle that appeared to be ischemic with electrocautery.  I had Dr. Oneida Alar take a look  also and he felt that resection was not necessary.  At this point, I reapproximated the anterior and posterior fascia to reapproximate the fascia with interrupted stitches of 2-0 Vicryl.  The skin was then reapproximated with staples.  The skin was cleaned, dried, and sterile bandages were applied.     A completion x-ray was completed due to the broken saw blade per protocol.  No residual foreign body was  found.   COMPLICATIONS: none  CONDITION: stable    Adele Barthel, MD [facility] Office: [phone] Pager: [phone]  01/02/2015, 12:23 PM

## 2015-01-02 NOTE — Progress Notes (Signed)
Rehab admissions - Patient known to me from recent inpatient rehab stay.  Will need PT and OT consults and insurance authorization prior to re admission to acute inpatient rehab.  I will follow along for rehab needs.  Call me for questions.  #[phone]

## 2015-01-02 NOTE — Anesthesia Preprocedure Evaluation (Signed)
Anesthesia Evaluation  Patient identified by MRN, date of birth, ID band Patient awake    Reviewed: Allergy & Precautions, NPO status , Patient's Chart, lab work & pertinent test results  Airway Mallampati: II  TM Distance: >3 FB Neck ROM: Full    Dental  (+) Teeth Intact   Pulmonary    breath sounds clear to auscultation       Cardiovascular  Rhythm:Regular Rate:Normal     Neuro/Psych    GI/Hepatic   Endo/Other    Renal/GU      Musculoskeletal   Abdominal   Peds  Hematology   Anesthesia Other Findings   Reproductive/Obstetrics                             Anesthesia Physical Anesthesia Plan  ASA: III  Anesthesia Plan: General   Post-op Pain Management:    Induction: Intravenous  Airway Management Planned: LMA  Additional Equipment:   Intra-op Plan:   Post-operative Plan:   Informed Consent: I have reviewed the patients History and Physical, chart, labs and discussed the procedure including the risks, benefits and alternatives for the proposed anesthesia with the patient or authorized representative who has indicated his/her understanding and acceptance.     Plan Discussed with: CRNA and Anesthesiologist  Anesthesia Plan Comments:         Anesthesia Quick Evaluation

## 2015-01-02 NOTE — Progress Notes (Signed)
ST 116-120, BP stable and pt states pain is tolerable. Dr Linna Caprice fully updated, OK to tx pt to 2W.

## 2015-01-02 NOTE — Plan of Care (Signed)
Problem: RH SKIN INTEGRITY Goal: RH STG ABLE TO PERFORM INCISION/WOUND CARE W/ASSISTANCE STG Able To Perform Incision/Wound Care With Mod Assistance.  Outcome: Not Progressing Patient not able to change dressing at this time.
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
## 2015-01-02 NOTE — Progress Notes (Signed)
Renee, Secretary/NT on Rehab by to get patient's cell phone and take back to his room.

## 2015-01-02 NOTE — Anesthesia Procedure Notes (Signed)
Procedure Name: LMA Insertion Date/Time: 01/02/2015 9:55 AM Performed by: Izora Gala Pre-anesthesia Checklist: Patient identified and Timeout performed Patient Re-evaluated:Patient Re-evaluated prior to inductionOxygen Delivery Method: Circle system utilized Preoxygenation: Pre-oxygenation with 100% oxygen Intubation Type: IV induction Ventilation: Mask ventilation without difficulty LMA: LMA inserted LMA Size: 5.0 Number of attempts: 1 Placement Confirmation: positive ETCO2 and breath sounds checked- equal and bilateral Tube secured with: Tape Dental Injury: Teeth and Oropharynx as per pre-operative assessment

## 2015-01-02 NOTE — Progress Notes (Addendum)
IV started in left hand and LR infusing at 50cc/hr.  Site Unremarkable. MAR tab not available earlier when LR started.  Pt scanned and LR scanned at 0920 when [facility] tab was available.
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
## 2015-01-02 NOTE — Transfer of Care (Signed)
Immediate Anesthesia Transfer of Care Note  Patient: Caleb [name]  Procedure(s) Performed: Procedure(s): IRRIGATION AND DEBRIDEMENT ABOVE KNEE AMPUTATION (Left) REVISION, LEFT ABOVE KNEE AMPUTATION (Left)  Patient Location: PACU  Anesthesia Type:General  Level of Consciousness: awake and patient cooperative  Airway & Oxygen Therapy: Patient Spontanous Breathing and Patient connected to nasal cannula oxygen  Post-op Assessment: Report given to RN, Post -op Vital signs reviewed and stable and Patient moving all extremities  Post vital signs: Reviewed and stable  Last Vitals:  Filed Vitals:   01/02/15 1252  BP:   Pulse:   Temp: 36.9 C  Resp:     Complications: No apparent anesthesia complications

## 2015-01-02 NOTE — Discharge Summary (Signed)
Discharge summary job # [id number]

## 2015-01-02 NOTE — Progress Notes (Signed)
Admitted to 2W20, Awake alert oriented x 4. Dsg to left AKA clean ,dry and intact. Pain level 6. Resting quietly, will continue to monitor.

## 2015-01-03 ENCOUNTER — Encounter (HOSPITAL_COMMUNITY): Payer: Self-pay | Admitting: Vascular Surgery

## 2015-01-03 LAB — CBC
HEMATOCRIT: 20.3 % — AB (ref 39.0–52.0)
HEMOGLOBIN: 6.6 g/dL — AB (ref 13.0–17.0)
MCH: 28.9 pg (ref 26.0–34.0)
MCHC: 32.5 g/dL (ref 30.0–36.0)
MCV: 89 fL (ref 78.0–100.0)
Platelets: 451 10*3/uL — ABNORMAL HIGH (ref 150–400)
RBC: 2.28 MIL/uL — AB (ref 4.22–5.81)
RDW: 14.4 % (ref 11.5–15.5)
WBC: 13.3 10*3/uL — AB (ref 4.0–10.5)

## 2015-01-03 LAB — BASIC METABOLIC PANEL
ANION GAP: 10 (ref 5–15)
BUN: 8 mg/dL (ref 6–20)
CHLORIDE: 102 mmol/L (ref 101–111)
CO2: 27 mmol/L (ref 22–32)
Calcium: 8.6 mg/dL — ABNORMAL LOW (ref 8.9–10.3)
Creatinine, Ser: 1.32 mg/dL — ABNORMAL HIGH (ref 0.61–1.24)
GFR calc Af Amer: >60 mL/min (ref >60)
Glucose, Bld: 101 mg/dL — ABNORMAL HIGH (ref 65–99)
POTASSIUM: 3.1 mmol/L — AB (ref 3.5–5.1)
SODIUM: 139 mmol/L (ref 135–145)

## 2015-01-03 LAB — HEMOGLOBIN AND HEMATOCRIT, BLOOD
HEMATOCRIT: 25.6 % — AB (ref 39.0–52.0)
HEMOGLOBIN: 8.4 g/dL — AB (ref 13.0–17.0)

## 2015-01-03 LAB — PREPARE RBC (CROSSMATCH)

## 2015-01-03 MED ORDER — HEPARIN SODIUM (PORCINE) 5000 UNIT/ML IJ SOLN
5000.0000 [IU] | Freq: Three times a day (TID) | INTRAMUSCULAR | Status: DC
  Administered 2015-01-04: 5000 [IU] via SUBCUTANEOUS
  Filled 2015-01-03: qty 1

## 2015-01-03 MED ORDER — SODIUM CHLORIDE 0.9 % IV SOLN: Freq: Once | INTRAVENOUS | Status: DC

## 2015-01-03 MED ORDER — FUROSEMIDE 10 MG/ML IJ SOLN
20.0000 mg | Freq: Once | INTRAMUSCULAR | Status: DC
  Filled 2015-01-03 (×2): qty 2

## 2015-01-03 NOTE — Progress Notes (Signed)
Rehab admissions - Awaiting PT/OT evals and recommendations so that I can submit to [facility] for authorization for re-admission to acute inpatient rehab.  Call me for questions.  #[phone]
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
## 2015-01-03 NOTE — Progress Notes (Addendum)
CIR SW Discharge Note  The overall goal for the admission was met for:   Discharge location: No - transferred back to acute for further surgery  Length of Stay: No - tf to acute  Discharge activity level: No  Home/community participation: No  Services provided included: MD, RD, PT, OT, RN, TR, Pharmacy and SW  Financial Services: Private Insurance: [employer]  Follow-up services arranged: NA - transferred to acute  Comments (or additional information):  At the time of pt's transfer, he was making gains with therapies, however, we had not yet set a targeted d/c date.  Therapists feel it would be beneficial for pt to return to CIR once cleared by surgery in order to meet originally targeted goals of modified independence.  Patient/Family verbalized understanding of follow-up arrangements: Yes  Individual responsible for coordination of the follow-up plan: pt  Confirmed correct DME delivered: NA    ,  

## 2015-01-03 NOTE — Anesthesia Postprocedure Evaluation (Signed)
  Anesthesia Post-op Note  Patient: Caleb [name]  Procedure(s) Performed: Procedure(s): IRRIGATION AND DEBRIDEMENT ABOVE KNEE AMPUTATION (Left) REVISION, LEFT ABOVE KNEE AMPUTATION (Left)  Patient Location: PACU  Anesthesia Type:General  Level of Consciousness: awake  Airway and Oxygen Therapy: Patient Spontanous Breathing  Post-op Pain: none  Post-op Assessment: Post-op Vital signs reviewed, Patient's Cardiovascular Status Stable, Respiratory Function Stable, Patent Airway, No signs of Nausea or vomiting and Pain level controlled LLE Motor Response: Other (Comment) (left AKA) LLE Sensation: No pain RLE Motor Response: Purposeful movement        Post-op Vital Signs: Reviewed and stable  Last Vitals:  Filed Vitals:   01/03/15 1519  BP: 140/87  Pulse: 107  Temp: 37.1 C  Resp: 20    Complications: No apparent anesthesia complications

## 2015-01-03 NOTE — Progress Notes (Addendum)
CRITICAL VALUE ALERT  Critical value received:  hgb 6.6  Date of notification:  01/03/2015   Time of notification:  0726  Critical value read back:Yes.    Nurse who received alert:  Ambrose Finland RN  MD notified (1st page):  Gerri Lins PA  Time of first page:  0740  MD notified (2nd page):  Time of second page:  Responding MD:  N/A  Time MD responded:  [phone]
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
## 2015-01-03 NOTE — Anesthesia Postprocedure Evaluation (Signed)
Anesthesia Post Note  Patient: Caleb [name]  Procedure(s) Performed: Procedure(s) (LRB): INSERTION OF DIALYSIS CATHETER (Left)  Anesthesia type: MAC  Patient location: PACU  Post pain: Pain level controlled and Adequate analgesia  Post assessment: Post-op Vital signs reviewed, Patient's Cardiovascular Status Stable and Respiratory Function Stable  Last Vitals:  Filed Vitals:   12/27/14 1420  BP: 134/79  Pulse: 91  Temp: 36.9 C  Resp: 16    Post vital signs: Reviewed and stable  Level of consciousness: awake, alert  and oriented  Complications: No apparent anesthesia complications

## 2015-01-03 NOTE — Progress Notes (Addendum)
  Progress Note    01/03/2015 7:19 AM 1 Day Post-Op  Subjective:  C/o pain  Tm 99.7 HR 100's-120's ST (>140's this am) 856'D-149'F systolic 026% RA  Filed Vitals:   01/03/15 0507  BP: 133/83  Pulse: 108  Temp: 99.7 F (37.6 C)  Resp: 18    Physical Exam: Incisions:  Bandage is clean and dry   CBC    Component Value Date/Time   WBC 9.2 12/29/2014 0656   RBC 3.03* 12/29/2014 0656   HGB 8.4* 12/29/2014 0656   HCT 26.5* 12/29/2014 0656   PLT 497* 12/29/2014 0656   MCV 87.5 12/29/2014 0656   MCH 27.7 12/29/2014 0656   MCHC 31.7 12/29/2014 0656   RDW 14.1 12/29/2014 0656   LYMPHSABS 1.1 12/28/2014 0606   MONOABS 0.7 12/28/2014 0606   EOSABS 0.4 12/28/2014 0606   BASOSABS 0.1 12/28/2014 0606    BMET    Component Value Date/Time   NA 139 01/03/2015 0450   K 3.1* 01/03/2015 0450   CL 102 01/03/2015 0450   CO2 27 01/03/2015 0450   GLUCOSE 101* 01/03/2015 0450   BUN 8 01/03/2015 0450   CREATININE 1.32* 01/03/2015 0450   CALCIUM 8.6* 01/03/2015 0450   GFRNONAA >60 01/03/2015 0450   GFRAA >60 01/03/2015 0450    INR    Component Value Date/Time   INR 1.49 11/26/2014 1110     Intake/Output Summary (Last 24 hours) at 01/03/15 0719 Last data filed at 01/03/15 0710  Gross per 24 hour  Intake   3460 ml  Output   1975 ml  Net   1485 ml     Assessment/Plan:  24 y.o. male is s/p   1. Evacuation of hematoma left above-knee amputation stump 2. Excision of posterior thigh muscle 3. Redo left above-knee amputation 1 Day Post-Op  -pt doing well this morning -pain is controlled, but wears off before next dose.  Pt has Oxycodone 5-15mg  q4h prn ordered.  Will change Morphine 2-3mg  q2h to q1h prn. -PT/OT eval and treat today-hopefully, he will be re-approved to go back to CIR. -pt tachycardic this am, but sinus-hopefully with better pain control, this will improve. -CBC pending   Leontine Locket, PA-C [facility] [phone] 01/03/2015 7:19 AM   Addendum:  [name] hgb 6.6 this am and with his tachycardia, will transfuse 2 units of PRBC's this morning.  Leontine Locket 01/03/2015 7:45 AM   Addendum  I have independently interviewed and examined the patient, and I agree with the physician assistant's findings.   In retrospect, I suspect some of the ischemic muscle was likely from the compartment syndrome in his thigh.  There was significant intraoperative bleeding as evident by the drop in H/H, so perfusion to the residual tissue would not be the cause of a stump failure.   As a precaution, will keep patient on 2 weeks of antibiotics though none of the ischemic muscle appeared infected.  Dressing off tomorrow.  Probably back to rehab tomorrow if stump ok.   Adele Barthel, MD [facility] Office: [phone] Pager: [phone]  01/03/2015, 10:41 AM

## 2015-01-04 ENCOUNTER — Inpatient Hospital Stay (HOSPITAL_COMMUNITY)
Admission: RE | Admit: 2015-01-04 | Discharge: 2015-01-09 | DRG: 949 | Disposition: A | Discharge status: Home Health | Payer: 59 | Source: Intra-hospital | Attending: Physical Medicine & Rehabilitation | Admitting: Physical Medicine & Rehabilitation

## 2015-01-04 DIAGNOSIS — F419 Anxiety disorder, unspecified: Secondary | ICD-10-CM | POA: Diagnosis present

## 2015-01-04 DIAGNOSIS — N179 Acute kidney failure, unspecified: Secondary | ICD-10-CM | POA: Diagnosis present

## 2015-01-04 DIAGNOSIS — E876 Hypokalemia: Secondary | ICD-10-CM | POA: Diagnosis present

## 2015-01-04 DIAGNOSIS — R000 Tachycardia, unspecified: Secondary | ICD-10-CM | POA: Diagnosis present

## 2015-01-04 DIAGNOSIS — K5900 Constipation, unspecified: Secondary | ICD-10-CM | POA: Diagnosis present

## 2015-01-04 DIAGNOSIS — Z91048 Other nonmedicinal substance allergy status: Secondary | ICD-10-CM

## 2015-01-04 DIAGNOSIS — D62 Acute posthemorrhagic anemia: Secondary | ICD-10-CM | POA: Diagnosis present

## 2015-01-04 DIAGNOSIS — Z89612 Acquired absence of left leg above knee: Secondary | ICD-10-CM | POA: Diagnosis not present

## 2015-01-04 DIAGNOSIS — Z89619 Acquired absence of unspecified leg above knee: Secondary | ICD-10-CM

## 2015-01-04 DIAGNOSIS — T8130XD Disruption of wound, unspecified, subsequent encounter: Principal | ICD-10-CM

## 2015-01-04 DIAGNOSIS — D473 Essential (hemorrhagic) thrombocythemia: Secondary | ICD-10-CM | POA: Diagnosis present

## 2015-01-04 LAB — CBC
HEMATOCRIT: 24.2 % — AB (ref 39.0–52.0)
HEMOGLOBIN: 8.2 g/dL — AB (ref 13.0–17.0)
MCH: 28.8 pg (ref 26.0–34.0)
MCHC: 33.9 g/dL (ref 30.0–36.0)
MCV: 84.9 fL (ref 78.0–100.0)
Platelets: 431 10*3/uL — ABNORMAL HIGH (ref 150–400)
RBC: 2.85 MIL/uL — ABNORMAL LOW (ref 4.22–5.81)
RDW: 14.7 % (ref 11.5–15.5)
WBC: 13 10*3/uL — AB (ref 4.0–10.5)

## 2015-01-04 LAB — TYPE AND SCREEN
ABO/RH(D): A POS
Antibody Screen: NEGATIVE
UNIT DIVISION: 0
Unit division: 0

## 2015-01-04 LAB — BASIC METABOLIC PANEL
ANION GAP: 12 (ref 5–15)
BUN: 6 mg/dL (ref 6–20)
CHLORIDE: 98 mmol/L — AB (ref 101–111)
CO2: 25 mmol/L (ref 22–32)
Calcium: 8.6 mg/dL — ABNORMAL LOW (ref 8.9–10.3)
Creatinine, Ser: 1.09 mg/dL (ref 0.61–1.24)
GFR calc Af Amer: >60 mL/min (ref >60)
GLUCOSE: 105 mg/dL — AB (ref 65–99)
POTASSIUM: 2.9 mmol/L — AB (ref 3.5–5.1)
Sodium: 135 mmol/L (ref 135–145)

## 2015-01-04 MED ORDER — SORBITOL 70 % SOLN: 30.0000 mL | Freq: Every day | Status: DC | PRN

## 2015-01-04 MED ORDER — OXYCODONE HCL 5 MG PO TABS
10.0000 mg | ORAL_TABLET | ORAL | Status: DC | PRN
  Administered 2015-01-04 – 2015-01-09 (×7): 10 mg via ORAL
  Filled 2015-01-04 (×8): qty 2

## 2015-01-04 MED ORDER — DOCUSATE SODIUM 100 MG PO CAPS: 100.0000 mg | ORAL_CAPSULE | Freq: Every day | ORAL | Status: DC

## 2015-01-04 MED ORDER — POTASSIUM CHLORIDE CRYS ER 20 MEQ PO TBCR
40.0000 meq | EXTENDED_RELEASE_TABLET | Freq: Two times a day (BID) | ORAL | Status: DC
  Administered 2015-01-04: 40 meq via ORAL
  Filled 2015-01-04: qty 2

## 2015-01-04 MED ORDER — HEPARIN SODIUM (PORCINE) 5000 UNIT/ML IJ SOLN
5000.0000 [IU] | Freq: Three times a day (TID) | INTRAMUSCULAR | Status: DC
  Administered 2015-01-04 – 2015-01-09 (×14): 5000 [IU] via SUBCUTANEOUS
  Filled 2015-01-04 (×14): qty 1

## 2015-01-04 MED ORDER — ONDANSETRON HCL 4 MG/2ML IJ SOLN: 4.0000 mg | Freq: Four times a day (QID) | INTRAMUSCULAR | Status: DC | PRN

## 2015-01-04 MED ORDER — ACETAMINOPHEN 160 MG/5ML PO SOLN: 650.0000 mg | Freq: Four times a day (QID) | ORAL | Status: DC | PRN

## 2015-01-04 MED ORDER — PANTOPRAZOLE SODIUM 40 MG PO TBEC
40.0000 mg | DELAYED_RELEASE_TABLET | Freq: Every day | ORAL | Status: DC
  Administered 2015-01-05 – 2015-01-09 (×5): 40 mg via ORAL
  Filled 2015-01-04 (×5): qty 1

## 2015-01-04 MED ORDER — LORAZEPAM 2 MG/ML IJ SOLN: 1.0000 mg | INTRAMUSCULAR | Status: DC | PRN

## 2015-01-04 MED ORDER — PANTOPRAZOLE SODIUM 40 MG PO TBEC: 40.0000 mg | DELAYED_RELEASE_TABLET | Freq: Every day | ORAL | Status: DC

## 2015-01-04 MED ORDER — ONDANSETRON HCL 4 MG PO TABS: 4.0000 mg | ORAL_TABLET | Freq: Four times a day (QID) | ORAL | Status: DC | PRN

## 2015-01-04 MED ORDER — DOCUSATE SODIUM 100 MG PO CAPS
100.0000 mg | ORAL_CAPSULE | Freq: Every day | ORAL | Status: DC
  Administered 2015-01-05 – 2015-01-08 (×4): 100 mg via ORAL
  Filled 2015-01-04 (×6): qty 1

## 2015-01-04 MED ORDER — HEPARIN SODIUM (PORCINE) 5000 UNIT/ML IJ SOLN: 5000.0000 [IU] | Freq: Three times a day (TID) | INTRAMUSCULAR | Status: DC

## 2015-01-04 MED ORDER — LORAZEPAM 1 MG PO TABS: 1.0000 mg | ORAL_TABLET | ORAL | Status: DC | PRN

## 2015-01-04 MED ORDER — BISACODYL 10 MG RE SUPP: 10.0000 mg | Freq: Every day | RECTAL | Status: DC | PRN

## 2015-01-04 MED ORDER — CEPHALEXIN 500 MG PO CAPS: 500.0000 mg | ORAL_CAPSULE | Freq: Three times a day (TID) | ORAL | Status: DC

## 2015-01-04 MED ORDER — CEPHALEXIN 250 MG PO CAPS
500.0000 mg | ORAL_CAPSULE | Freq: Three times a day (TID) | ORAL | Status: DC
  Administered 2015-01-04 – 2015-01-09 (×14): 500 mg via ORAL
  Filled 2015-01-04 (×14): qty 2

## 2015-01-04 MED ORDER — OXYCODONE HCL 5 MG PO TABS: 5.0000 mg | ORAL_TABLET | ORAL | Status: DC | PRN

## 2015-01-04 MED ORDER — FERROUS SULFATE 325 (65 FE) MG PO TABS
325.0000 mg | ORAL_TABLET | Freq: Every day | ORAL | Status: DC
  Administered 2015-01-05 – 2015-01-09 (×5): 325 mg via ORAL
  Filled 2015-01-04 (×5): qty 1

## 2015-01-04 MED ORDER — LORAZEPAM 0.5 MG PO TABS: 1.0000 mg | ORAL_TABLET | ORAL | Status: DC | PRN

## 2015-01-04 MED ORDER — FERROUS SULFATE 325 (65 FE) MG PO TABS: 325.0000 mg | ORAL_TABLET | Freq: Every day | ORAL | Status: DC
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
## 2015-01-04 NOTE — Progress Notes (Addendum)
  Progress Note    01/04/2015 7:56 AM 2 Days Post-Op  Subjective:  No complaints  Tm 100.2 now afebrile HR 093'A-355'D systolic 322% RA   Filed Vitals:   01/04/15 0459  BP: 128/78  Pulse: 104  Temp: 98.7 F (37.1 C)  Resp: 16    Physical Exam: Incisions:  Staple line is clean and dry with staples in tact.   CBC    Component Value Date/Time   WBC 13.0* 01/04/2015 0455   RBC 2.85* 01/04/2015 0455   HGB 8.2* 01/04/2015 0455   HCT 24.2* 01/04/2015 0455   PLT 431* 01/04/2015 0455   MCV 84.9 01/04/2015 0455   MCH 28.8 01/04/2015 0455   MCHC 33.9 01/04/2015 0455   RDW 14.7 01/04/2015 0455   LYMPHSABS 1.1 12/28/2014 0606   MONOABS 0.7 12/28/2014 0606   EOSABS 0.4 12/28/2014 0606   BASOSABS 0.1 12/28/2014 0606    BMET    Component Value Date/Time   NA 135 01/04/2015 0455   K 2.9* 01/04/2015 0455   CL 98* 01/04/2015 0455   CO2 25 01/04/2015 0455   GLUCOSE 105* 01/04/2015 0455   BUN 6 01/04/2015 0455   CREATININE 1.09 01/04/2015 0455   CALCIUM 8.6* 01/04/2015 0455   GFRNONAA >60 01/04/2015 0455   GFRAA >60 01/04/2015 0455    INR    Component Value Date/Time   INR 1.49 11/26/2014 1110     Intake/Output Summary (Last 24 hours) at 01/04/15 0756 Last data filed at 01/04/15 0448  Gross per 24 hour  Intake    335 ml  Output   1175 ml  Net   -840 ml     Assessment/Plan:  24 y.o. male is s/p left above knee amputation  2 Days Post-Op  -pt's stump is viable  -hypokalemia-will supplement -acute surgical blood loss anemia-improved with transfusion -check labs in am -renal function continues to improve -hopefully back to CIR today -will order a retention sock   Leontine Locket, PA-C [facility] [phone] 01/04/2015 7:56 AM   Addendum  Will need to recheck stump in 2 weeks. Initial appearance of stump may hide any problems due to the residual ischemic muscle.  Reset clock for staple removal in 4 weeks. Ok to go back to [facility]  when ok with inpt rehab.  Adele Barthel, MD [facility] Office: [phone] Pager: [phone]  01/04/2015, 8:25 AM

## 2015-01-04 NOTE — Care Management Note (Signed)
Case Management Note  Patient Details  Name: FRANTZ QUATTRONE MRN: [id number] Date of Birth: 07/24/1990  Subjective/Objective:    Hospital Course:  The patient was admitted to the hospital and taken to the operating room on 01/02/2015 and underwent: 1. Evacuation of hematoma left above-knee amputation stump 2. Excision of posterior thigh muscle 3. Redo left above-knee amputation           Action/Plan:   Expected Discharge Date:  01/03/15               Expected Discharge Plan:  [facility] (Pt discharged to [facility] today 01/04/15)  In-House Referral:     Discharge planning Services  CM Consult  Post Acute Care Choice:    Choice offered to:     DME Arranged:    DME Agency:     HH Arranged:    [place] Agency:     Status of Service:  Completed, signed off  Medicare Important Message Given:    Date Medicare IM Given:    Medicare IM give by:    Date Additional Medicare IM Given:    Additional Medicare Important Message give by:     If discussed at [place] of Stay Meetings, dates discussed:    Additional Comments:  Maryclare Labrador, RN 01/04/2015, 4:32 PM

## 2015-01-04 NOTE — Progress Notes (Signed)
Rehab admissions - I have authorization for acute inpatient rehab admission for today.  I spoke with patient and he is agreeable.  Bed available and will admit to acute inpatient rehab today.  Call me for questions.  #[phone]
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
## 2015-01-04 NOTE — Progress Notes (Signed)
[employer] rep here with retention sock. Lateral edge of left stump incision with serosanguenous drainage. Gauze 4X4s and Kerlex dressing applied to contain drainage prior to application of retention sock. Stump is very tender, pt medicated, will apply sock once pain med takes effect.
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
## 2015-01-04 NOTE — Progress Notes (Signed)
UR Completed. Lera Gaines, RN, BSN.  [phone] 

## 2015-01-04 NOTE — Progress Notes (Signed)
Rehab admissions - I have called and faxed updates to [facility] requesting acute inpatient rehab re admission.  I will await response from insurance case manager and should her back later today.  We do have beds on inpatient rehab available today.  Call me for questions.  #[phone]
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
## 2015-01-04 NOTE — Progress Notes (Signed)
Orthopedic Tech Progress Note Patient Details:  Caleb [name] 11-10-1990 [id number]  Patient ID: Caleb [name], male   DOB: [birth year], 24 y.o.   MRN: [id number] Called in bio-tech brace order; spoke with Dolores Lory, Angellee Cohill 01/04/2015, 9:27 AM

## 2015-01-04 NOTE — Progress Notes (Signed)
Pt admitted to inpatient rehab Room MW09 escorted by staff accompanied by family. Pt and family verbalized understanding of rehab process. Fall prevention safety plan signed. Vital signs stable with no signs of distress. Will continue to monitor. Kennieth Francois, RN

## 2015-01-05 ENCOUNTER — Inpatient Hospital Stay (HOSPITAL_COMMUNITY): Payer: 59 | Admitting: Physical Therapy

## 2015-01-05 ENCOUNTER — Inpatient Hospital Stay (HOSPITAL_COMMUNITY): Payer: 59 | Admitting: Occupational Therapy

## 2015-01-05 ENCOUNTER — Inpatient Hospital Stay (HOSPITAL_COMMUNITY): Payer: Self-pay

## 2015-01-05 DIAGNOSIS — E876 Hypokalemia: Secondary | ICD-10-CM

## 2015-01-05 DIAGNOSIS — Z89612 Acquired absence of left leg above knee: Secondary | ICD-10-CM

## 2015-01-05 DIAGNOSIS — D62 Acute posthemorrhagic anemia: Secondary | ICD-10-CM

## 2015-01-05 LAB — CBC WITH DIFFERENTIAL/PLATELET
BASOS ABS: 0 10*3/uL (ref 0.0–0.1)
BASOS PCT: 0 %
EOS ABS: 0.7 10*3/uL (ref 0.0–0.7)
EOS PCT: 7 %
HCT: 24.2 % — ABNORMAL LOW (ref 39.0–52.0)
HEMOGLOBIN: 7.9 g/dL — AB (ref 13.0–17.0)
Lymphocytes Relative: 11 %
Lymphs Abs: 1.1 10*3/uL (ref 0.7–4.0)
MCH: 27.7 pg (ref 26.0–34.0)
MCHC: 32.6 g/dL (ref 30.0–36.0)
MCV: 84.9 fL (ref 78.0–100.0)
Monocytes Absolute: 0.7 10*3/uL (ref 0.1–1.0)
Monocytes Relative: 7 %
NEUTROS PCT: 75 %
Neutro Abs: 8 10*3/uL — ABNORMAL HIGH (ref 1.7–7.7)
PLATELETS: 422 10*3/uL — AB (ref 150–400)
RBC: 2.85 MIL/uL — AB (ref 4.22–5.81)
RDW: 14.7 % (ref 11.5–15.5)
WBC: 10.5 10*3/uL (ref 4.0–10.5)

## 2015-01-05 LAB — COMPREHENSIVE METABOLIC PANEL
ALBUMIN: 2.1 g/dL — AB (ref 3.5–5.0)
ALK PHOS: 65 U/L (ref 38–126)
ALT: 25 U/L (ref 17–63)
AST: 28 U/L (ref 15–41)
Anion gap: 12 (ref 5–15)
CO2: 27 mmol/L (ref 22–32)
CREATININE: 0.98 mg/dL (ref 0.61–1.24)
Calcium: 8.7 mg/dL — ABNORMAL LOW (ref 8.9–10.3)
Chloride: 97 mmol/L — ABNORMAL LOW (ref 101–111)
GFR calc non Af Amer: >60 mL/min (ref >60)
GLUCOSE: 122 mg/dL — AB (ref 65–99)
Potassium: 2.7 mmol/L — CL (ref 3.5–5.1)
SODIUM: 136 mmol/L (ref 135–145)
Total Bilirubin: 0.4 mg/dL (ref 0.3–1.2)
Total Protein: 6 g/dL — ABNORMAL LOW (ref 6.5–8.1)

## 2015-01-05 MED ORDER — POTASSIUM CHLORIDE CRYS ER 20 MEQ PO TBCR: 20.0000 meq | EXTENDED_RELEASE_TABLET | Freq: Three times a day (TID) | ORAL | Status: DC

## 2015-01-05 MED ORDER — POTASSIUM CHLORIDE CRYS ER 20 MEQ PO TBCR
40.0000 meq | EXTENDED_RELEASE_TABLET | Freq: Three times a day (TID) | ORAL | Status: DC
  Administered 2015-01-05 – 2015-01-07 (×7): 40 meq via ORAL
  Filled 2015-01-05 (×7): qty 2

## 2015-01-05 NOTE — Progress Notes (Signed)
Occupational Therapy Session Note  Patient Details  Name: Caleb [name] MRN: [id number] Date of Birth: October 10, 1990  Today's Date: 01/05/2015 OT Individual Time: 1105-1200 OT Individual Time Calculation (min): 55 min    Short Term Goals: Week 1:  OT Short Term Goal 1 (Week 1): STGs=LTGs secondary to estimated short LOS  Skilled Therapeutic Interventions/Progress Updates:  Pt with recent admission and procedure off floor. Pt returning for continued therapy today with OT reassessment in preparation for discharge home. Pt with 7/10 c/o pain in L LE this session described as aching and throbbing and premedicated prior to therapist entering the room. Pt declined shower this session. Pt seated on EOB for bathing and dressing with min verbal cues for techniques for lateral leans in order to manage clothing. Pt stood from standard height bed with mod A sit <>stand with therapist vs AD. Pt became ill during session and began vomiting. RN notified and performed dressing change as well. Pt performed squat pivot transfer into wheelchair from bed with steady assist. Call bell and all needed items within reach upon exiting the room. Pt requesting goal level to be from wheelchair level.   Therapy Documentation Precautions:  Restrictions Weight Bearing Restrictions: Yes LLE Weight Bearing: Non weight bearing   Pain: Pain Assessment Pain Assessment: 0-10 Pain Score: 6  Pain Type: Surgical pain Pain Location: Leg Pain Orientation: Left Pain Descriptors / Indicators: Aching Pain Onset: On-going Pain Intervention(s): RN made aware;Repositioned  See Function Navigator for Current Functional Status.   Therapy/Group: Individual Therapy  Phineas Semen 01/05/2015, 12:24 PM

## 2015-01-05 NOTE — Progress Notes (Signed)
Occupational Therapy Session Note  Patient Details  Name: Caleb [name] MRN: [id number] Date of Birth: 1990-06-14  Today's Date: 01/05/2015 OT Individual Time: 1105-1200 OT Individual Time Calculation (min): 55 min    Short Term Goals: Week 1:  OT Short Term Goal 1 (Week 1): STGs=LTGs secondary to estimated short LOS  Skilled Therapeutic Interventions/Progress Updates:  Focus on functional transfers, tub transfer bench transfers, w/c mobility, and discharge planning.  Pt practiced squat/scoot transfers, including tub transfer bench, at supervision level.  Pt expressed desire to discharge home as early as possible and stated that if his goal was to be able to "do what I need to" at w/c level so he could go home.  Pt noted with increased pain in LLE with activity.  Therapy Documentation Precautions:  Restrictions Weight Bearing Restrictions: Yes LLE Weight Bearing: Non weight bearing  Pain: Pain Assessment Pain Assessment: 0-10 Pain Score: 6  Pain Type: Surgical pain Pain Location: Leg Pain Orientation: Left Pain Descriptors / Indicators: Aching Pain Onset: On-going Pain Intervention(s): RN made aware;Repositioned  See Function Navigator for Current Functional Status.   Therapy/Group: Individual Therapy  Leroy Libman 01/05/2015, 12:13 PM
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
## 2015-01-05 NOTE — Progress Notes (Signed)
CRITICAL VALUE ALERT  Critical value received:  K+ : 2.7  Date of notification:  01/05/15  Time of notification:  0711  Critical value read back:Yes.    Nurse who received alert:  Genene Churn, RN  MD notified (1st page):  Lauraine Rinne , PA   Time of first page:  [phone]  MD notified (2nd page):  Time of second page:  Responding MD: Lauraine Rinne, [place]             Time MD responded:  [phone]
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
## 2015-01-06 ENCOUNTER — Inpatient Hospital Stay (HOSPITAL_COMMUNITY): Payer: 59 | Admitting: Physical Therapy

## 2015-01-06 ENCOUNTER — Inpatient Hospital Stay (HOSPITAL_COMMUNITY): Payer: 59 | Admitting: Occupational Therapy

## 2015-01-06 ENCOUNTER — Telehealth: Payer: Self-pay | Admitting: Vascular Surgery

## 2015-01-06 LAB — CBC
HCT: 23.9 % — ABNORMAL LOW (ref 39.0–52.0)
HEMOGLOBIN: 8 g/dL — AB (ref 13.0–17.0)
MCH: 28.7 pg (ref 26.0–34.0)
MCHC: 33.5 g/dL (ref 30.0–36.0)
MCV: 85.7 fL (ref 78.0–100.0)
PLATELETS: 452 10*3/uL — AB (ref 150–400)
RBC: 2.79 MIL/uL — AB (ref 4.22–5.81)
RDW: 14.6 % (ref 11.5–15.5)
WBC: 9 10*3/uL (ref 4.0–10.5)

## 2015-01-06 LAB — BASIC METABOLIC PANEL
ANION GAP: 9 (ref 5–15)
BUN: <5 mg/dL — ABNORMAL LOW (ref 6–20)
CALCIUM: 8.4 mg/dL — AB (ref 8.9–10.3)
CHLORIDE: 98 mmol/L — AB (ref 101–111)
CO2: 27 mmol/L (ref 22–32)
CREATININE: 0.9 mg/dL (ref 0.61–1.24)
GFR calc non Af Amer: >60 mL/min (ref >60)
Glucose, Bld: 97 mg/dL (ref 65–99)
Potassium: 2.8 mmol/L — ABNORMAL LOW (ref 3.5–5.1)
SODIUM: 134 mmol/L — AB (ref 135–145)
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
## 2015-01-06 NOTE — Progress Notes (Signed)
Pt refused RN to assess R AKA stump. Kerlix in place CDI.

## 2015-01-06 NOTE — IPOC Note (Signed)
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[place])     Functional Problem List: Nursing Endurance, Medication Management, Skin Integrity, Safety  PT Balance, Motor, Pain, Sensory, Skin Integrity  OT Balance, Endurance, Motor, Safety, Edema  SLP    TR         Basic ADL's: OT Grooming, Bathing, Dressing, Toileting     Advanced  ADL's: OT Other (comment) (n/a)     Transfers: PT Bed Mobility, Bed to Chair, Car, Manufacturing systems engineer, Metallurgist: PT Ambulation, Emergency planning/management officer, Stairs     Additional Impairments: OT None  SLP        TR      Anticipated Outcomes Item Anticipated Outcome  Self Feeding n/a  Swallowing      Basic self-care  Mod I   Toileting  Mod I    Bathroom Transfers Mod I   Bowel/Bladder  mod I   Transfers  mod I  Locomotion  mod I  Communication     Cognition     Pain  <3  Safety/Judgment  Supervision    Therapy Plan: PT Intensity: Minimum of 1-2 x/day ,45 to 90 minutes PT Frequency: 5 out of 7 days PT Duration Estimated Length of Stay: 5 days OT Intensity: Minimum of 1-2 x/day, 45 to 90 minutes OT Frequency: 5 out of 7 days OT Duration/Estimated Length of Stay: 5-7 days         Team Interventions: Nursing Interventions Patient/Family Education, Pain Management, Skin Care/Wound Management, Discharge Planning  PT interventions Ambulation/gait training, Balance/vestibular training, DME/adaptive equipment instruction, Functional mobility training, Neuromuscular re-education, Patient/family education, Stair training, Therapeutic Activities, Therapeutic Exercise, UE/LE Strength taining/ROM, UE/LE Coordination activities, Wheelchair propulsion/positioning  OT Interventions Training and development officer, Academic librarian, Neuromuscular re-education, Barrister's clerk  education, Self Care/advanced ADL retraining, Therapeutic Exercise, UE/LE Coordination activities, Wheelchair propulsion/positioning, UE/LE Strength taining/ROM, Therapeutic Activities, Skin care/wound managment, Psychosocial support, Pain management, Functional mobility training, DME/adaptive equipment instruction, Discharge planning  SLP Interventions    TR Interventions    SW/CM Interventions      Team Discharge Planning: Destination: PT-Home ,OT- Home , SLP-  Projected Follow-up: PT-Home health PT, OT-  Home health OT, SLP-  Projected Equipment Needs: PT-Wheelchair (measurements), Wheelchair cushion (measurements), Rolling walker with 5" wheels, OT- Tub/shower bench, SLP-  Equipment Details: PT- , OT-  Patient/family involved in discharge planning: PT- Patient,  OT-Patient, SLP-   MD ELOS: 5-6 days Medical Rehab Prognosis:  Excellent Assessment: The patient has been admitted for CIR therapies with the diagnosis of left AKA. The team will be addressing functional mobility, strength, stamina, balance, safety, adaptive techniques and equipment, self-care, bowel and bladder mgt, patient and caregiver education, pre-prosthetic education, pain control, ego support, wound care. Goals have been set at mod I with mobility and self-care/ADL's.    [name], MD, FAAPMR      See Team Conference Notes for weekly updates to the plan of care

## 2015-01-06 NOTE — Progress Notes (Signed)
Patient information reviewed and entered into eRehab system by Daiva Nakayama, RN, CRRN, PPS Coordinator as a CIR interrupted stay as patient was gone less than 3 days. Please also reference [phone] when reviewing this record. Information including medical coding and functional independence measure will be reviewed and updated through discharge.

## 2015-01-06 NOTE — Telephone Encounter (Signed)
-----   Message from Gabriel Earing, [place] sent at 01/04/2015  4:21 PM EDT ----- This pt should be scheduled to come back in 4 weeks for staple removal, but will need a f/u appt in 2 weeks to check his stump with Dr. Bridgett Larsson.    Thanks!

## 2015-01-06 NOTE — Progress Notes (Signed)
Occupational Therapy Session Note  Patient Details  Name: Caleb [name] MRN: [id number] Date of Birth: Apr 13, 1990  Today's Date: 01/06/2015 OT Individual Time:  - 0626-9485  (90 min)      Short Term Goals: Week 1:  OT Short Term Goal 1 (Week 1): STGs=LTGs secondary to estimated short LOS :     Skilled Therapeutic Interventions/Progress Updates:  Pt lying in bed>  GF on mattress on floor.  Provided suit case for pt to get clothes out for dressing.  Refused to shower in ADL apt, but agreed for to shower in walk in shower  Pain- LE 3/10.  Pt was SBA with supine>sit>lateral scoots to foot of bed; min assist with sit to stand and ambulation to shower bench (8 feet).  Pt applied plastic wrap to Left residual limb.  Performed bathing in seated position with SBA.  Did lateral leans for peri care - SBA.  Pt transferred to [facility] to dry and dress.   Ambulated to  Bed with 2WW and min assist.  . Call bell and all needed items within reach upon exiting the room.  Therapy Documentation Precautions:  Restrictions Weight Bearing Restrictions: Yes LLE Weight Bearing: Non weight bearing      Pain:  3/10 left residual limb             See Function Navigator for Current Functional Status.   Therapy/Group: Individual Therapy  Lisa Roca 01/06/2015, 7:46 AM

## 2015-01-06 NOTE — Progress Notes (Signed)
Physical Therapy Session Note  Patient Details  Name: Caleb [name] MRN: [id number] Date of Birth: Apr 25, 1990  Today's Date: 01/06/2015 PT Individual Time: 1002-1100 and 1510-1610 PT Individual Time Calculation (min): 58 min and 60 min   Short Term Goals: Week 1:  PT Short Term Goal 1 (Week 1): STGs=LTGs due to short ELOS  Skilled Therapeutic Interventions/Progress Updates:    Session 1: Pt received supine in bed reporting episode of emesis, but states he's feeling better and agreeable to therapy.  Session focused on gait training, HEP, and patient education.  Pt amb 45' with RW and supervision with intermittent steady assist for anterior LOB.  Pt propelled w/c remaining distance to therapy gym and transferred to therapy mat with supervision.  PT instructed patient in RLE therex with 4# ankle weight for heel/toe raises, LAQ, hip abd/add with level 1 theraband and supine exercises with no added resistance for SLR and heel slides.  All exercises performed 10-15 reps.  Pt reports pain in L residual limb rose to 6/10 with exercise.  PT provided patient education on pacing and rest breaks, especially during therex to manage pain at tolerable levels.  Pt education to continue throughout stay.  Pt returned to room in w/c at end of session and positioned sitting in w/c with call bell in reach and needs met.    Session 2: Pt received supine in bed and agreeable to therapy session.  Session focused on functional transfers, w/c mobility, and dynamic sitting/standing balance.  Pt performed squat/pivot transfers with mod I and propelled w/c to/from therapy gym mod I.  PT instructed patient in dynamic sitting (no UE/LE support) and standing (no UE support) activity playing different Wii games with his brother.  Pt able to tolerate ~5 minutes of standing initially, progressing to repeated sit>stands to conserve energy.  Pt demonstrates good postural corrections and able to self correct any LOB.  Pt made mod I in  w/c while in room, hope to progress to mod I with RW in room by Monday.  Pt returned to room in w/c with family.    Therapy Documentation Precautions:  Restrictions Weight Bearing Restrictions: Yes LLE Weight Bearing: Non weight bearing Pain: Pain Assessment Pain Assessment: 0-10 Pain Score: 4  Pain Location: Leg Pain Orientation: Left Pain Intervention(s): Emotional support   See Function Navigator for Current Functional Status.   Therapy/Group: Individual Therapy  Saphyra Hutt E Penven-Crew 01/06/2015, 12:10 PM

## 2015-01-07 ENCOUNTER — Inpatient Hospital Stay (HOSPITAL_COMMUNITY): Payer: 59

## 2015-01-07 LAB — BASIC METABOLIC PANEL
ANION GAP: 10 (ref 5–15)
BUN: <5 mg/dL — ABNORMAL LOW (ref 6–20)
CHLORIDE: 101 mmol/L (ref 101–111)
CO2: 27 mmol/L (ref 22–32)
Calcium: 8.7 mg/dL — ABNORMAL LOW (ref 8.9–10.3)
Creatinine, Ser: 0.92 mg/dL (ref 0.61–1.24)
GFR calc Af Amer: >60 mL/min (ref >60)
GFR calc non Af Amer: >60 mL/min (ref >60)
GLUCOSE: 99 mg/dL (ref 65–99)
POTASSIUM: 3.1 mmol/L — AB (ref 3.5–5.1)
Sodium: 138 mmol/L (ref 135–145)

## 2015-01-07 MED ORDER — POTASSIUM CHLORIDE CRYS ER 20 MEQ PO TBCR
40.0000 meq | EXTENDED_RELEASE_TABLET | Freq: Three times a day (TID) | ORAL | Status: DC
  Administered 2015-01-07 – 2015-01-09 (×6): 40 meq via ORAL
  Filled 2015-01-07 (×6): qty 2

## 2015-01-07 MED ORDER — POTASSIUM CHLORIDE 20 MEQ/15ML (10%) PO SOLN
40.0000 meq | Freq: Three times a day (TID) | ORAL | Status: DC
  Filled 2015-01-07: qty 30
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
## 2015-01-07 NOTE — Progress Notes (Signed)
Occupational Therapy Session Note  Patient Details  Name: Caleb [name] MRN: [id number] Date of Birth: [birth year]  Today's Date: 01/07/2015 OT Individual Time: 0900-1000 OT Individual Time Calculation (min): 60 min    Short Term Goals: Week 1:  OT Short Term Goal 1 (Week 1): STGs=LTGs secondary to estimated short LOS  Skilled Therapeutic Interventions/Progress Updates:    Pt seen for 1:1 OT session with focus on ADL retraining with focus on functional mobility, safety awareness, and functional transfers. Pt received supine in bed agreeable to bathing and dressing this AM. Therapist assisted with covering residual limb for protection then pt ambulated bed>bathroom at supervision level using RW. Pt completed bathing and dressing with Mod I and use of RW to assist with balance. Ambulated back to bed and left sitting EOB for dressing change. Discussed DME and home setup with brother present. Pt and family report no questions at this time in regard to discharge plans.   Therapy Documentation Precautions:  Restrictions Weight Bearing Restrictions: Yes LLE Weight Bearing: Non weight bearing General:   Vital Signs:   Pain: Pain Assessment Pain Assessment: 0-10 Pain Score: Asleep Pain Type: Surgical pain;Neuropathic pain Pain Location: Leg Pain Orientation: Left Pain Descriptors / Indicators: Aching Pain Frequency: Intermittent Pain Onset: On-going Pain Intervention(s): Medication (See eMAR) Multiple Pain Sites: No  See Function Navigator for Current Functional Status.   Therapy/Group: Individual Therapy  Duayne Cal 01/07/2015, 9:40 AM

## 2015-01-08 ENCOUNTER — Inpatient Hospital Stay (HOSPITAL_COMMUNITY): Payer: 59 | Admitting: Physical Therapy
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
## 2015-01-08 NOTE — Progress Notes (Signed)
Physical Therapy Session Note  Patient Details  Name: Caleb [name] MRN: [id number] Date of Birth: 19-May-1990  Today's Date: 01/08/2015 PT Individual Time: 1100-1153 PT Individual Time Calculation (min): 53 min   Short Term Goals: Week 1:  PT Short Term Goal 1 (Week 1): STGs=LTGs due to short ELOS  Skilled Therapeutic Interventions/Progress Updates:   Patient in bed, donned shorts and shirt with mod I from bed level with sit <> stand. Patient mod I for squat pivot and supervision with stand pivot using RW for transfers. Patient propelled wheelchair community distances throughout hospital and outdoors on uneven surfaces with mod I. Patient ambulated in outdoor environment using RW 2 x 150 ft on uneven surfaces, inclines/declines, up/down curb using RW, and up/down 5 brick steps using L rail with supervision overall. Patient required 2 seated rest breaks. Front caster of wheelchair noted to be loose with increased difficulty for patient to propel, switched out wheelchair for 16 x 18 wheelchair with R leg rest. Performed NuStep using RLE and BUE at level 5-7 x 4 min, patient noted that L residual limb dressing had come loose. Transferred to new wheelchair and patient requested to return to room to fix dressing. Patient left in wheelchair to return to room with visitor.   Therapy Documentation Precautions:  Restrictions Weight Bearing Restrictions: Yes LLE Weight Bearing: Non weight bearing Pain: Pain Assessment Pain Assessment: No/denies pain   See Function Navigator for Current Functional Status.   Therapy/Group: Individual Therapy  Laretta Alstrom 01/08/2015, 12:12 PM

## 2015-01-09 ENCOUNTER — Inpatient Hospital Stay (HOSPITAL_COMMUNITY): Payer: Self-pay | Admitting: Occupational Therapy

## 2015-01-09 ENCOUNTER — Inpatient Hospital Stay (HOSPITAL_COMMUNITY): Payer: Self-pay | Admitting: Physical Therapy

## 2015-01-09 LAB — BASIC METABOLIC PANEL
ANION GAP: 10 (ref 5–15)
BUN: <5 mg/dL — ABNORMAL LOW (ref 6–20)
CHLORIDE: 100 mmol/L — AB (ref 101–111)
CO2: 26 mmol/L (ref 22–32)
Calcium: 8.7 mg/dL — ABNORMAL LOW (ref 8.9–10.3)
Creatinine, Ser: 0.93 mg/dL (ref 0.61–1.24)
GFR calc non Af Amer: >60 mL/min (ref >60)
GLUCOSE: 97 mg/dL (ref 65–99)
POTASSIUM: 3.8 mmol/L (ref 3.5–5.1)
Sodium: 136 mmol/L (ref 135–145)

## 2015-01-09 MED ORDER — CEPHALEXIN 500 MG PO CAPS: 500.0000 mg | ORAL_CAPSULE | Freq: Three times a day (TID) | ORAL | Status: AC

## 2015-01-09 MED ORDER — POTASSIUM CHLORIDE CRYS ER 20 MEQ PO TBCR: 20.0000 meq | EXTENDED_RELEASE_TABLET | Freq: Every day | ORAL | Status: DC

## 2015-01-09 MED ORDER — LORAZEPAM 1 MG PO TABS: 1.0000 mg | ORAL_TABLET | ORAL | Status: DC | PRN

## 2015-01-09 MED ORDER — PANTOPRAZOLE SODIUM 40 MG PO TBEC: 40.0000 mg | DELAYED_RELEASE_TABLET | Freq: Every day | ORAL | Status: DC

## 2015-01-09 MED ORDER — OXYCODONE HCL 10 MG PO TABS: 10.0000 mg | ORAL_TABLET | ORAL | Status: DC | PRN

## 2015-01-09 MED ORDER — FERROUS SULFATE 325 (65 FE) MG PO TABS: 325.0000 mg | ORAL_TABLET | Freq: Every day | ORAL | Status: DC
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
## 2015-01-09 NOTE — Progress Notes (Signed)
Occupational Therapy Discharge Summary and OT Intervention  Patient Details  Name: Caleb [name] MRN: [id number] Date of Birth: March 20, 1991  Today's Date: 01/09/2015 OT Individual Time: 0900-1000 OT Individual Time Calculation (min): 60 min    Patient has met 8 of 8 long term goals due to improved activity tolerance, improved balance and ability to compensate for deficits.  Patient to discharge at overall Modified Independent level.   Reasons goals not met: all goals met  Recommendation:  Patient will benefit from ongoing skilled OT services in home health setting to continue to advance functional skills in the area of BADL and iADL.  Equipment: Tub transfer bench  Reasons for discharge: treatment goals met  Patient/family agrees with progress made and goals achieved: Yes   OT Intervention: Upon entering the room, pt supine in bed with no c/o pain this session. Pt obtaining all needed items for self care task independently this session. Pt ambulating with RW into bathroom for bathing at shower level while seated on TTB for safety. Pt donned clothing after bathing while seated on EOB with Mod I. OT educated pt , significant other, and sister present about OT discharge recommendations regarding TTB, safety treads, and HHOT expectations. Pt verbalizing understanding and having no further questions this session. Pt seated EOB with call bell and all needed items within reach. Pt is Mod I in room.  OT Discharge Precautions/Restrictions  Precautions Precautions: Fall Precaution Comments: new L AKA Restrictions Weight Bearing Restrictions: Yes LLE Weight Bearing: Non weight bearing  Pain Pain Assessment Pain Assessment: No/denies pain Vision/Perception  Vision- History Baseline Vision/History: Wears glasses Wears Glasses:  (doesn't always wear them) Patient Visual Report: No change from baseline Vision- Assessment Vision Assessment?: No apparent visual deficits   Cognition Overall Cognitive Status: Within Functional Limits for tasks assessed Arousal/Alertness: Awake/alert Orientation Level: Oriented X4 Attention: Alternating Sustained Attention: Appears intact Alternating Attention: Appears intact Memory: Appears intact Awareness: Appears intact Problem Solving: Appears intact Safety/Judgment: Appears intact Sensation Sensation Light Touch: Appears Intact Stereognosis: Not tested Hot/Cold: Appears Intact Coordination Gross Motor Movements are Fluid and Coordinated: Yes Fine Motor Movements are Fluid and Coordinated: Yes Motor  Motor Motor: Within Functional Limits Mobility  Bed Mobility Bed Mobility: Supine to Sit;Sit to Supine Rolling Right: 7: Independent Rolling Left: 7: Independent Supine to Sit: 7: Independent Sit to Supine: 7: Independent Transfers Transfers: Sit to Stand;Stand to Sit Sit to Stand: 6: Modified independent (Device/Increase time) Stand to Sit: 6: Modified independent (Device/Increase time)  Trunk/Postural Assessment  Cervical Assessment Cervical Assessment: Within Functional Limits Thoracic Assessment Thoracic Assessment: Within Functional Limits Lumbar Assessment Lumbar Assessment: Within Functional Limits Postural Control Postural Control: Within Functional Limits  Balance Balance Balance Assessed: Yes Dynamic Sitting Balance Dynamic Sitting - Balance Support: No upper extremity supported;Feet supported Dynamic Sitting - Level of Assistance: 7: Independent Static Standing Balance Static Standing - Balance Support: No upper extremity supported Static Standing - Level of Assistance: 6: Modified independent (Device/Increase time) Dynamic Standing Balance Dynamic Standing - Balance Support: No upper extremity supported Dynamic Standing - Level of Assistance: 6: Modified independent (Device/Increase time) Dynamic Standing - Balance Activities: Lateral lean/weight shifting;Forward lean/weight  shifting;Reaching for objects Extremity/Trunk Assessment RUE Assessment RUE Assessment: Within Functional Limits LUE Assessment LUE Assessment: Within Functional Limits   See Function Navigator for Current Functional Status.  Phineas Semen 01/09/2015, 2:41 PM
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
## 2015-01-09 NOTE — Progress Notes (Signed)
Physical Therapy Discharge Summary  Patient Details  Name: Caleb [name] MRN: [id number] Date of Birth: 09-24-1990  Today's Date: 01/09/2015 PT Individual Time: 1000-1039 PT Individual Time Calculation (min): 39 min    Patient has met 10 of 10 long term goals due to improved activity tolerance, improved balance, improved postural control, increased strength, decreased pain and ability to compensate for deficits.  Patient to discharge at w/c level for community, ambulatory level for household level Modified Independent.   Patient's care partner is independent to provide the necessary supervision assistance at discharge.   Recommendation:  Patient will benefit from ongoing skilled PT services in home health setting to continue to advance safe functional mobility, address ongoing impairments in endurance, strength, pain, balance, and safety awareness, and minimize fall risk.  Equipment: w/c and RW  Reasons for discharge: treatment goals met  Patient/family agrees with progress made and goals achieved: Yes   Skilled Therapeutic Intervention:  Pt received sitting EOB and agreeable to therapy session.  Session focused on reassessment of ROM, strength, sensation, balance, bed mobility, transfers, gait, and w/c mobility.  Pt demonstrates all mobility with mod I, static/dynamic standing balance with mod I and able to self correct LOB, and improving safety awareness.  Pt continues to demonstrate decreased muscle endurance in RLE and has a tendency to move quickly.  Provided patient education on taking his time to move safely throughout the home while he recovers.  Pt verbalized understanding.  Pt positioned in bed with call bell in reach and needs met.   PT Discharge Precautions/Restrictions Precautions Precaution Comments: new L AKA Restrictions Weight Bearing Restrictions: Yes LLE Weight Bearing: Non weight bearing Pain Pain Assessment Pain Assessment: No/denies  pain  Cognition Overall Cognitive Status: Within Functional Limits for tasks assessed Arousal/Alertness: Awake/alert Orientation Level: Oriented X4 Attention: Alternating Sustained Attention: Appears intact Alternating Attention: Appears intact Memory: Appears intact Awareness: Appears intact Problem Solving: Appears intact Safety/Judgment: Appears intact Sensation Sensation Light Touch: Appears Intact Motor  Motor Motor: Within Functional Limits  Mobility Bed Mobility Bed Mobility: Supine to Sit;Sit to Supine Rolling Right: 7: Independent Rolling Left: 7: Independent Supine to Sit: 7: Independent Sit to Supine: 7: Independent Transfers Transfers: Yes Sit to Stand: 6: Modified independent (Device/Increase time) Stand to Sit: 6: Modified independent (Device/Increase time) Stand Pivot Transfers: 6: Modified independent (Device/Increase time) Lateral/Scoot Transfers: 6: Modified independent (Device/Increase time) Artist / Additional Locomotion Stairs: Yes Stairs Assistance: 6: Modified independent (Device/Increase time) Stair Management Technique: Two rails Number of Stairs: 12 Wheelchair Mobility Wheelchair Mobility: Yes Wheelchair Assistance: 6: Modified independent (Device/Increase time) Environmental health practitioner: Both upper extremities Wheelchair Parts Management: Independent Distance: 150  Trunk/Postural Assessment  Cervical Assessment Cervical Assessment: Within Functional Limits Thoracic Assessment Thoracic Assessment: Within Functional Limits Lumbar Assessment Lumbar Assessment: Within Functional Limits Postural Control Postural Control: Within Functional Limits  Balance Balance Balance Assessed: Yes Dynamic Sitting Balance Dynamic Sitting - Balance Support: No upper extremity supported;Feet supported Dynamic Sitting - Level of Assistance: 7: Independent Dynamic Sitting - Balance Activities: Tourist information centre manager Standing -  Balance Support: No upper extremity supported Static Standing - Level of Assistance: 6: Modified independent (Device/Increase time) Dynamic Standing Balance Dynamic Standing - Balance Support: No upper extremity supported Dynamic Standing - Level of Assistance: 6: Modified independent (Device/Increase time) Dynamic Standing - Balance Activities: Ball toss Extremity Assessment      RLE Assessment RLE Assessment: Within Functional Limits RLE Strength RLE Overall Strength Comments: continues to demonstrate 5/5  muscle power, but decreased muscle endurance with continued hold against resistance LLE Assessment LLE Assessment: Exceptions to [facility] LLE Strength LLE Overall Strength Comments: hip flexion at least 3/5, unble to test resistance 2/2 tenderness   See Function Navigator for Current Functional Status.  Keshaun Dubey E Penven-Crew 01/09/2015, 10:56 AM
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
## 2015-01-09 NOTE — Discharge Summary (Signed)
Discharge summary job [id number]
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
## 2015-01-09 NOTE — Progress Notes (Signed)
Social Work  Discharge Note  The overall goal for the admission was met for:   Discharge location: Yes - home with grandmother and other family members providing assist  Length of Stay: Yes - 11 days (with interrupted stay)  Discharge activity level: Yes - modified independent  Home/community participation: Yes  Services provided included: MD, RD, PT, OT, RN, TR, Pharmacy and Manor: Private Insurance: [facility]  Follow-up services arranged: Home Health: RN, PT, OT via [place], DME: [phone] lightweight w/c with left amputee support pad, cushion, rolling walker and tub bench all via [place] and Patient/Family has no preference for HH/DME agencies  Comments (or additional information):  Patient/Family verbalized understanding of follow-up arrangements: Yes  Individual responsible for coordination of the follow-up plan: pt  Confirmed correct DME delivered: Alys Dulak 01/09/2015    Daylyn Christine

## 2015-01-09 NOTE — Discharge Instructions (Signed)
Inpatient Rehab Discharge Instructions  [facility] Discharge date and time: No discharge date for patient encounter.   Activities/Precautions/ Functional Status: Activity: activity as tolerated Diet: regular diet Wound Care: keep wound clean and dry Functional status:  ___ No restrictions     ___ Walk up steps independently ___ 24/7 supervision/assistance   ___ Walk up steps with assistance ___ Intermittent supervision/assistance  ___ Bathe/dress independently ___ Walk with walker     ___ Bathe/dress with assistance ___ Walk Independently    ___ Shower independently ___ Walk with assistance    ___ Shower with assistance ___ No alcohol     ___ Return to work/school ________    COMMUNITY REFERRALS UPON DISCHARGE:    Home Health:   PT    OT     RN                    Agency:  [facility] Phone: [phone]   Medical Equipment/Items Ordered:  Wheelchair, walker, tub bench                                                      Agency/Supplier:  [facility] @ [phone]   GENERAL COMMUNITY RESOURCES FOR PATIENT/FAMILY:  Support Groups:  Amputee Support Group       Special Instructions: Follow-up potassium level with Primary M.D.   My questions have been answered and I understand these instructions. I will adhere to these goals and the provided educational materials after my discharge from the hospital.  Patient/Caregiver Signature _______________________________ Date __________  Clinician Signature _______________________________________ Date __________  Please bring this form and your medication list with you to all your follow-up doctor's appointments.

## 2015-01-09 NOTE — Progress Notes (Signed)
Pt discharged to home with significant other. Pt given discharge instructions and all belongings are at bedside. Pt educated on stump care and given supplies to care for incision

## 2015-01-10 ENCOUNTER — Encounter: Payer: Self-pay | Admitting: Vascular Surgery

## 2015-01-13 ENCOUNTER — Encounter: Payer: Self-pay | Admitting: Vascular Surgery

## 2015-01-13 ENCOUNTER — Ambulatory Visit (INDEPENDENT_AMBULATORY_CARE_PROVIDER_SITE_OTHER): Payer: 59 | Admitting: Vascular Surgery

## 2015-01-13 VITALS — BP 135/93 | HR 100 | Temp 98.6°F | Resp 18 | Ht 72.0 in | Wt 177.0 lb

## 2015-01-13 DIAGNOSIS — Z89612 Acquired absence of left leg above knee: Secondary | ICD-10-CM

## 2015-01-13 DIAGNOSIS — T148 Other injury of unspecified body region: Secondary | ICD-10-CM

## 2015-01-13 DIAGNOSIS — S75102D Unspecified injury of femoral vein at hip and thigh level, left leg, subsequent encounter: Secondary | ICD-10-CM

## 2015-01-13 DIAGNOSIS — T79A22D Traumatic compartment syndrome of left lower extremity, subsequent encounter: Secondary | ICD-10-CM

## 2015-01-13 DIAGNOSIS — S75002D Unspecified injury of femoral artery, left leg, subsequent encounter: Secondary | ICD-10-CM

## 2015-01-13 DIAGNOSIS — W3400XA Accidental discharge from unspecified firearms or gun, initial encounter: Secondary | ICD-10-CM

## 2015-01-13 NOTE — Progress Notes (Signed)
    Postoperative Visit   History of Present Illness  Caleb [name] is a 24 y.o. male who presents for wound check for redo L AKA (01/02/15).  Formal follow-up for the rest of his procedures and L AKA is scheduled for 3 weeks from now.  For VQI Use Only  PRE-ADM LIVING: Home  AMB STATUS: Wheelchair  Physical Examination  Filed Vitals:   01/13/15 1150  BP: 135/93  Pulse: 100  Temp:   Resp:    LLE: Incision is healing.  No ballotable fluid collection, Staples are intact.  Medical Decision Making  Caleb [name] is a 24 y.o. male who presents s/p L above-the-knee ampuation.   The patient's stump is healing appropriately   Follow up in 3 weeks for staple removal and follow up on rest of procedure completed on this patient.  Adele Barthel, MD [facility] Office: [phone] Pager: [phone]  01/13/2015, 12:11 PM

## 2015-01-18 ENCOUNTER — Encounter: Payer: Self-pay | Admitting: Physical Medicine & Rehabilitation

## 2015-01-20 ENCOUNTER — Encounter: Payer: Self-pay | Admitting: Vascular Surgery

## 2015-01-31 ENCOUNTER — Encounter: Payer: Self-pay | Admitting: Vascular Surgery

## 2015-01-31 DIAGNOSIS — Z48812 Encounter for surgical aftercare following surgery on the circulatory system: Secondary | ICD-10-CM | POA: Diagnosis not present

## 2015-01-31 DIAGNOSIS — Z4781 Encounter for orthopedic aftercare following surgical amputation: Secondary | ICD-10-CM | POA: Diagnosis not present

## 2015-01-31 DIAGNOSIS — Z89612 Acquired absence of left leg above knee: Secondary | ICD-10-CM | POA: Diagnosis not present

## 2015-01-31 DIAGNOSIS — I82402 Acute embolism and thrombosis of unspecified deep veins of left lower extremity: Secondary | ICD-10-CM | POA: Diagnosis not present

## 2015-02-02 ENCOUNTER — Ambulatory Visit: Payer: Self-pay | Admitting: Family

## 2015-02-03 ENCOUNTER — Ambulatory Visit (INDEPENDENT_AMBULATORY_CARE_PROVIDER_SITE_OTHER): Payer: 59 | Admitting: Vascular Surgery

## 2015-02-03 ENCOUNTER — Encounter: Payer: Self-pay | Admitting: Vascular Surgery

## 2015-02-03 VITALS — BP 138/91 | HR 86 | Temp 97.9°F | Resp 16 | Ht 73.0 in | Wt 156.0 lb

## 2015-02-03 DIAGNOSIS — Z89612 Acquired absence of left leg above knee: Secondary | ICD-10-CM

## 2015-02-03 NOTE — Progress Notes (Signed)
    Postoperative Visit   History of Present Illness  Caleb [name] is a 24 y.o. male who presents for postoperative follow-up for: L above-the-knee amputation (Date: 01/02/15).  The patient's wounds are healed.  The patient notes pain is well controlled.  The patient's current symptoms are: phantom pain.  For VQI Use Only  PRE-ADM LIVING: Home  AMB STATUS: Ambulatory  Physical Examination  Filed Vitals:   02/03/15 1033  BP: 138/91  Pulse: 86  Temp:   Resp:    LLE: Incision is healed.  Staples are intact.  Medical Decision Making  Caleb [name] is a 24 y.o. male who presents s/p left above-the-knee ampuation.  The patient's stump is healing appropriately with resolution of pre-operative symptoms. I discussed in depth with the patient the nature of atherosclerosis, and emphasized the importance of maximal medical management including strict control of blood pressure, blood glucose, and lipid levels, obtaining regular exercise, and cessation of smoking.  The patient is aware that without maximal medical management the underlying atherosclerotic disease process will progress, possibly leading to a more proximal amputation. The patient agrees to participate in their maximal medical care.  Thank you for allowing [place] to participate in this patient's care.  The patient has been referred for prosthetic fitting.  The patient will follow up in 3 months for evaluation of his L CFV-FV bypass and L SFA-SFA bypass with a L arterial duplex   Adele Barthel, MD [facility] Office: [phone] Pager: [phone]  02/03/2015, 12:30 PM

## 2015-02-09 DIAGNOSIS — Z0279 Encounter for issue of other medical certificate: Secondary | ICD-10-CM | POA: Diagnosis not present

## 2015-02-21 ENCOUNTER — Encounter: Payer: 59 | Admitting: Physical Medicine & Rehabilitation

## 2015-02-22 ENCOUNTER — Encounter: Payer: Self-pay | Admitting: Physical Medicine & Rehabilitation

## 2015-02-22 ENCOUNTER — Encounter: Payer: 59 | Attending: Physical Medicine & Rehabilitation | Admitting: Physical Medicine & Rehabilitation

## 2015-02-22 VITALS — BP 136/82 | HR 75

## 2015-02-22 DIAGNOSIS — Y929 Unspecified place or not applicable: Secondary | ICD-10-CM | POA: Insufficient documentation

## 2015-02-22 DIAGNOSIS — W3409XA Accidental discharge from other specified firearms, initial encounter: Secondary | ICD-10-CM | POA: Diagnosis not present

## 2015-02-22 DIAGNOSIS — Z89612 Acquired absence of left leg above knee: Secondary | ICD-10-CM | POA: Diagnosis present

## 2015-02-22 DIAGNOSIS — Y999 Unspecified external cause status: Secondary | ICD-10-CM | POA: Diagnosis not present

## 2015-02-22 DIAGNOSIS — T8130XA Disruption of wound, unspecified, initial encounter: Secondary | ICD-10-CM | POA: Diagnosis not present

## 2015-02-22 DIAGNOSIS — G546 Phantom limb syndrome with pain: Secondary | ICD-10-CM | POA: Diagnosis not present

## 2015-02-22 MED ORDER — GABAPENTIN 100 MG PO CAPS: 100.0000 mg | ORAL_CAPSULE | Freq: Every day | ORAL | Status: DC

## 2015-02-22 NOTE — Patient Instructions (Signed)
GABAPENTIN:  TAKE ONE CAP AT BEDTIME---TRY FOR 2-3 DAYS. IF NO RESULTS INCREASE TO 2 CAPS AT BED TIME FOR 2-3 DAYS. IF NO BENEFIT INCREASE TO 3 CAPS.  IF STILL NO BENEFIT CALL ME
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
## 2015-04-05 ENCOUNTER — Encounter: Payer: Self-pay | Admitting: Physical Medicine & Rehabilitation

## 2015-04-05 ENCOUNTER — Encounter: Payer: 59 | Attending: Physical Medicine & Rehabilitation | Admitting: Physical Medicine & Rehabilitation

## 2015-04-05 VITALS — BP 134/72 | HR 68 | Resp 14

## 2015-04-05 DIAGNOSIS — T8130XA Disruption of wound, unspecified, initial encounter: Secondary | ICD-10-CM | POA: Insufficient documentation

## 2015-04-05 DIAGNOSIS — Z89612 Acquired absence of left leg above knee: Secondary | ICD-10-CM

## 2015-04-05 DIAGNOSIS — G546 Phantom limb syndrome with pain: Secondary | ICD-10-CM | POA: Diagnosis not present
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
## 2015-04-05 NOTE — Patient Instructions (Signed)
CONTACT ME AND I'LL MAKE A REFERRAL TO PHYSICAL THERAPY FOR PROSTHETIC TRAINING   PLEASE CALL ME WITH ANY PROBLEMS OR QUESTIONS [phone]).

## 2015-05-04 ENCOUNTER — Encounter: Payer: Self-pay | Admitting: Vascular Surgery

## 2015-05-08 ENCOUNTER — Other Ambulatory Visit: Payer: Self-pay | Admitting: *Deleted

## 2015-05-08 DIAGNOSIS — Z48812 Encounter for surgical aftercare following surgery on the circulatory system: Secondary | ICD-10-CM

## 2015-05-08 DIAGNOSIS — I739 Peripheral vascular disease, unspecified: Secondary | ICD-10-CM

## 2015-05-12 ENCOUNTER — Ambulatory Visit (HOSPITAL_COMMUNITY)
Admission: RE | Admit: 2015-05-12 | Discharge: 2015-05-12 | Disposition: A | Discharge status: Home / Self Care | Payer: 59 | Source: Ambulatory Visit | Attending: Vascular Surgery | Admitting: Vascular Surgery

## 2015-05-12 ENCOUNTER — Encounter: Payer: Self-pay | Admitting: Vascular Surgery

## 2015-05-12 ENCOUNTER — Ambulatory Visit (INDEPENDENT_AMBULATORY_CARE_PROVIDER_SITE_OTHER): Payer: 59 | Admitting: Vascular Surgery

## 2015-05-12 VITALS — BP 128/84 | HR 72 | Temp 98.2°F | Resp 16 | Ht 73.0 in | Wt 180.0 lb

## 2015-05-12 DIAGNOSIS — Z48812 Encounter for surgical aftercare following surgery on the circulatory system: Secondary | ICD-10-CM

## 2015-05-12 DIAGNOSIS — T82868A Thrombosis of vascular prosthetic devices, implants and grafts, initial encounter: Secondary | ICD-10-CM | POA: Diagnosis not present

## 2015-05-12 DIAGNOSIS — I739 Peripheral vascular disease, unspecified: Secondary | ICD-10-CM

## 2015-05-12 DIAGNOSIS — Y832 Surgical operation with anastomosis, bypass or graft as the cause of abnormal reaction of the patient, or of later complication, without mention of misadventure at the time of the procedure: Secondary | ICD-10-CM | POA: Insufficient documentation

## 2015-05-12 DIAGNOSIS — Z89612 Acquired absence of left leg above knee: Secondary | ICD-10-CM
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
## 2015-06-06 ENCOUNTER — Ambulatory Visit: Payer: 59 | Attending: Vascular Surgery | Admitting: Physical Therapy

## 2015-06-06 ENCOUNTER — Encounter: Payer: Self-pay | Admitting: Physical Therapy

## 2015-06-06 DIAGNOSIS — Z89612 Acquired absence of left leg above knee: Secondary | ICD-10-CM | POA: Diagnosis present

## 2015-06-06 DIAGNOSIS — R2689 Other abnormalities of gait and mobility: Secondary | ICD-10-CM

## 2015-06-06 DIAGNOSIS — R269 Unspecified abnormalities of gait and mobility: Secondary | ICD-10-CM | POA: Diagnosis not present

## 2015-06-06 DIAGNOSIS — R2681 Unsteadiness on feet: Secondary | ICD-10-CM | POA: Insufficient documentation

## 2015-06-06 DIAGNOSIS — Z4789 Encounter for other orthopedic aftercare: Secondary | ICD-10-CM

## 2015-06-06 DIAGNOSIS — Z5189 Encounter for other specified aftercare: Secondary | ICD-10-CM | POA: Insufficient documentation

## 2015-06-06 DIAGNOSIS — R29898 Other symptoms and signs involving the musculoskeletal system: Secondary | ICD-10-CM | POA: Diagnosis present

## 2015-06-06 DIAGNOSIS — R29818 Other symptoms and signs involving the nervous system: Secondary | ICD-10-CM | POA: Insufficient documentation

## 2015-06-06 DIAGNOSIS — R6889 Other general symptoms and signs: Secondary | ICD-10-CM | POA: Diagnosis present
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
## 2015-06-08 ENCOUNTER — Encounter: Payer: Self-pay | Admitting: Physical Therapy

## 2015-06-08 ENCOUNTER — Ambulatory Visit: Payer: 59 | Admitting: Physical Therapy

## 2015-06-08 DIAGNOSIS — R269 Unspecified abnormalities of gait and mobility: Secondary | ICD-10-CM

## 2015-06-08 DIAGNOSIS — Z4789 Encounter for other orthopedic aftercare: Secondary | ICD-10-CM

## 2015-06-08 DIAGNOSIS — R6889 Other general symptoms and signs: Secondary | ICD-10-CM
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
## 2015-06-08 NOTE — Patient Instructions (Signed)
Do each exercise 1-2  times per day Do each exercise 10 repetitions Hold each exercise for 5 seconds to feel your location  AT SINK FIND YOUR MIDLINE POSITION AND PLACE FEET EQUAL DISTANCE FROM THE MIDLINE.  USE TAPE ON FLOOR TO MARK THE MIDLINE POSITION. You also should try to feel with your limb pressure in socket.  You are trying to feel with limb what you used to feel with the bottom of your foot.  1. Side to Side Shift: Moving your hips only (not shoulders): move weight onto your left leg, HOLD/FEEL.  Move back to equal weight on each leg, HOLD/FEEL. Move weight onto your right leg, HOLD/FEEL. Move back to equal weight on each leg, HOLD/FEEL. Repeat. 2. Front to Back Shift: Moving your hips only (not shoulders): move your weight forward onto your toes, HOLD/FEEL. Move your weight back to equal Flat Foot on both legs, HOLD/FEEL. Move your weight back onto your heels, HOLD/FEEL. Move your weight back to equal on both legs, HOLD/FEEL. Repeat. 3. Moving Cones / Cups: With equal weight on each leg: Hold on with one hand the first time, then progress to no hand supports. Move cups from one side of sink to the other. Place cups ~2" out of your reach, progress to 10" beyond reach. 4. Overhead/Upward Reaching: alternated reaching up to top cabinets or ceiling if no cabinets present. Keep equal weight on each leg. Start with one hand support on counter while other hand reaches and progress to no hand support with reaching. 5.   Looking Over Shoulders: With equal weight on each leg: alternate turning to look          over your shoulders with one hand support on counter as needed. Shift weight to             side looking, pull hip then shoulder then head/eyes around to look behind you. Start       with one hand support & progress to no hand support. 

## 2015-06-12 ENCOUNTER — Ambulatory Visit: Payer: 59 | Admitting: Physical Therapy

## 2015-06-12 ENCOUNTER — Encounter: Payer: Self-pay | Admitting: Physical Therapy

## 2015-06-12 DIAGNOSIS — R2681 Unsteadiness on feet: Secondary | ICD-10-CM

## 2015-06-12 DIAGNOSIS — R269 Unspecified abnormalities of gait and mobility: Secondary | ICD-10-CM

## 2015-06-12 DIAGNOSIS — Z4789 Encounter for other orthopedic aftercare: Secondary | ICD-10-CM
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
## 2015-06-16 ENCOUNTER — Encounter: Payer: Self-pay | Admitting: Physical Therapy

## 2015-06-16 ENCOUNTER — Ambulatory Visit: Payer: 59 | Admitting: Physical Therapy

## 2015-06-16 DIAGNOSIS — Z89612 Acquired absence of left leg above knee: Secondary | ICD-10-CM

## 2015-06-16 DIAGNOSIS — R269 Unspecified abnormalities of gait and mobility: Secondary | ICD-10-CM | POA: Diagnosis not present

## 2015-06-16 DIAGNOSIS — R2681 Unsteadiness on feet: Secondary | ICD-10-CM

## 2015-06-16 DIAGNOSIS — Z4789 Encounter for other orthopedic aftercare: Secondary | ICD-10-CM

## 2015-06-16 DIAGNOSIS — R2689 Other abnormalities of gait and mobility: Secondary | ICD-10-CM

## 2015-06-16 DIAGNOSIS — R6889 Other general symptoms and signs: Secondary | ICD-10-CM

## 2015-06-19 ENCOUNTER — Encounter: Payer: Self-pay | Admitting: Physical Therapy

## 2015-06-19 ENCOUNTER — Ambulatory Visit: Payer: 59 | Admitting: Physical Therapy

## 2015-06-19 DIAGNOSIS — Z89612 Acquired absence of left leg above knee: Secondary | ICD-10-CM

## 2015-06-19 DIAGNOSIS — R2681 Unsteadiness on feet: Secondary | ICD-10-CM

## 2015-06-19 DIAGNOSIS — R6889 Other general symptoms and signs: Secondary | ICD-10-CM

## 2015-06-19 DIAGNOSIS — R269 Unspecified abnormalities of gait and mobility: Secondary | ICD-10-CM | POA: Diagnosis not present

## 2015-06-19 DIAGNOSIS — Z4789 Encounter for other orthopedic aftercare: Secondary | ICD-10-CM

## 2015-06-19 DIAGNOSIS — R2689 Other abnormalities of gait and mobility: Secondary | ICD-10-CM
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
## 2015-06-22 ENCOUNTER — Ambulatory Visit: Payer: 59 | Admitting: Physical Therapy

## 2015-06-22 ENCOUNTER — Encounter: Payer: Self-pay | Admitting: Physical Therapy

## 2015-06-22 DIAGNOSIS — R2689 Other abnormalities of gait and mobility: Secondary | ICD-10-CM

## 2015-06-22 DIAGNOSIS — Z89612 Acquired absence of left leg above knee: Secondary | ICD-10-CM

## 2015-06-22 DIAGNOSIS — R29898 Other symptoms and signs involving the musculoskeletal system: Secondary | ICD-10-CM

## 2015-06-22 DIAGNOSIS — R269 Unspecified abnormalities of gait and mobility: Secondary | ICD-10-CM

## 2015-06-22 DIAGNOSIS — Z4789 Encounter for other orthopedic aftercare: Secondary | ICD-10-CM

## 2015-06-22 DIAGNOSIS — R6889 Other general symptoms and signs: Secondary | ICD-10-CM

## 2015-06-22 DIAGNOSIS — R2681 Unsteadiness on feet: Secondary | ICD-10-CM
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
## 2015-06-27 ENCOUNTER — Encounter: Payer: Self-pay | Admitting: Physical Therapy

## 2015-06-27 ENCOUNTER — Ambulatory Visit: Payer: 59 | Attending: Vascular Surgery | Admitting: Physical Therapy

## 2015-06-27 DIAGNOSIS — R2689 Other abnormalities of gait and mobility: Secondary | ICD-10-CM | POA: Insufficient documentation

## 2015-06-27 DIAGNOSIS — R2681 Unsteadiness on feet: Secondary | ICD-10-CM | POA: Diagnosis present

## 2015-06-27 DIAGNOSIS — R29898 Other symptoms and signs involving the musculoskeletal system: Secondary | ICD-10-CM

## 2015-06-30 ENCOUNTER — Ambulatory Visit: Payer: 59 | Admitting: Physical Therapy

## 2015-06-30 ENCOUNTER — Encounter: Payer: Self-pay | Admitting: Physical Therapy

## 2015-06-30 DIAGNOSIS — R29898 Other symptoms and signs involving the musculoskeletal system: Secondary | ICD-10-CM

## 2015-06-30 DIAGNOSIS — R2681 Unsteadiness on feet: Secondary | ICD-10-CM

## 2015-06-30 DIAGNOSIS — R2689 Other abnormalities of gait and mobility: Secondary | ICD-10-CM
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
## 2015-06-30 NOTE — Patient Instructions (Signed)
Perform all of these in the corner with a chair in front of you for safety:   Feet Together (Compliant Surface) Varied Arm Positions - Eyes Closed    Stand on compliant surface: _pillows/cushion_ with feet together and arms at your sides. Close eyes and visualize upright position. Hold__30__ seconds. Repeat __3__ times per session. Do _1-2___ sessions per day.  Copyright  VHI. All rights reserved.  Feet Together (Compliant Surface) Head Motion - Eyes Closed    Stand on compliant surface: __pillows/cushion__ with feet together. Close eyes and move head slowly: 1. up and down 2. Left and right 3. Diagonals both ways. Repeat _10__ times each way. Do _1-2_ sessions per day.  Copyright  VHI. All rights reserved.
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
## 2015-07-03 ENCOUNTER — Encounter: Payer: Self-pay | Admitting: Physical Therapy

## 2015-07-03 ENCOUNTER — Ambulatory Visit: Payer: 59 | Admitting: Physical Therapy

## 2015-07-03 DIAGNOSIS — R29898 Other symptoms and signs involving the musculoskeletal system: Secondary | ICD-10-CM

## 2015-07-03 DIAGNOSIS — R2689 Other abnormalities of gait and mobility: Secondary | ICD-10-CM | POA: Diagnosis not present

## 2015-07-03 DIAGNOSIS — R2681 Unsteadiness on feet: Secondary | ICD-10-CM
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
## 2015-07-05 ENCOUNTER — Encounter: Payer: Self-pay | Admitting: Physical Therapy

## 2015-07-05 ENCOUNTER — Ambulatory Visit: Payer: 59 | Admitting: Physical Therapy

## 2015-07-05 DIAGNOSIS — R29898 Other symptoms and signs involving the musculoskeletal system: Secondary | ICD-10-CM

## 2015-07-05 DIAGNOSIS — R2689 Other abnormalities of gait and mobility: Secondary | ICD-10-CM | POA: Diagnosis not present

## 2015-07-05 DIAGNOSIS — R2681 Unsteadiness on feet: Secondary | ICD-10-CM
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
## 2015-07-10 ENCOUNTER — Encounter: Payer: Self-pay | Admitting: Physical Therapy

## 2015-07-10 ENCOUNTER — Ambulatory Visit: Payer: 59 | Admitting: Physical Therapy

## 2015-07-10 DIAGNOSIS — R2681 Unsteadiness on feet: Secondary | ICD-10-CM

## 2015-07-10 DIAGNOSIS — R29898 Other symptoms and signs involving the musculoskeletal system: Secondary | ICD-10-CM

## 2015-07-10 DIAGNOSIS — R2689 Other abnormalities of gait and mobility: Secondary | ICD-10-CM | POA: Diagnosis not present
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
## 2015-07-13 ENCOUNTER — Ambulatory Visit: Payer: 59 | Admitting: Physical Therapy

## 2015-07-13 DIAGNOSIS — R29898 Other symptoms and signs involving the musculoskeletal system: Secondary | ICD-10-CM

## 2015-07-13 DIAGNOSIS — R2681 Unsteadiness on feet: Secondary | ICD-10-CM

## 2015-07-13 DIAGNOSIS — R2689 Other abnormalities of gait and mobility: Secondary | ICD-10-CM | POA: Diagnosis not present
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
## 2015-07-14 NOTE — Addendum Note (Signed)
Addended by: Isaias Cowman on: 07/14/2015 11:54 AM   Modules accepted: Orders

## 2015-07-18 ENCOUNTER — Ambulatory Visit: Payer: 59 | Admitting: Physical Therapy

## 2015-07-18 DIAGNOSIS — R2681 Unsteadiness on feet: Secondary | ICD-10-CM

## 2015-07-18 DIAGNOSIS — R2689 Other abnormalities of gait and mobility: Secondary | ICD-10-CM

## 2015-07-18 DIAGNOSIS — R29898 Other symptoms and signs involving the musculoskeletal system: Secondary | ICD-10-CM
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
## 2015-07-20 ENCOUNTER — Encounter: Payer: Self-pay | Admitting: Physical Therapy

## 2015-07-20 ENCOUNTER — Ambulatory Visit: Payer: 59 | Admitting: Physical Therapy

## 2015-07-20 DIAGNOSIS — R29898 Other symptoms and signs involving the musculoskeletal system: Secondary | ICD-10-CM

## 2015-07-20 DIAGNOSIS — R2689 Other abnormalities of gait and mobility: Secondary | ICD-10-CM

## 2015-07-20 DIAGNOSIS — R2681 Unsteadiness on feet: Secondary | ICD-10-CM
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
## 2015-07-25 ENCOUNTER — Encounter: Payer: Self-pay | Admitting: Physical Therapy

## 2015-07-25 ENCOUNTER — Ambulatory Visit: Payer: 59 | Attending: Vascular Surgery | Admitting: Physical Therapy

## 2015-07-25 DIAGNOSIS — R29898 Other symptoms and signs involving the musculoskeletal system: Secondary | ICD-10-CM

## 2015-07-25 DIAGNOSIS — R2689 Other abnormalities of gait and mobility: Secondary | ICD-10-CM | POA: Diagnosis present

## 2015-07-25 DIAGNOSIS — R2681 Unsteadiness on feet: Secondary | ICD-10-CM | POA: Diagnosis present
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
## 2015-07-27 ENCOUNTER — Ambulatory Visit: Payer: 59 | Admitting: Physical Therapy

## 2015-07-28 DIAGNOSIS — Z0279 Encounter for issue of other medical certificate: Secondary | ICD-10-CM | POA: Diagnosis not present

## 2015-08-01 ENCOUNTER — Ambulatory Visit: Payer: 59 | Admitting: Physical Therapy

## 2015-08-01 DIAGNOSIS — R2689 Other abnormalities of gait and mobility: Secondary | ICD-10-CM

## 2015-08-01 DIAGNOSIS — R2681 Unsteadiness on feet: Secondary | ICD-10-CM

## 2015-08-01 DIAGNOSIS — R29898 Other symptoms and signs involving the musculoskeletal system: Secondary | ICD-10-CM
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
## 2015-08-03 ENCOUNTER — Ambulatory Visit: Payer: 59 | Admitting: Physical Therapy

## 2015-08-03 DIAGNOSIS — R29898 Other symptoms and signs involving the musculoskeletal system: Secondary | ICD-10-CM

## 2015-08-03 DIAGNOSIS — R2689 Other abnormalities of gait and mobility: Secondary | ICD-10-CM | POA: Diagnosis not present

## 2015-08-03 DIAGNOSIS — R2681 Unsteadiness on feet: Secondary | ICD-10-CM

## 2015-08-04 ENCOUNTER — Encounter: Payer: Self-pay | Admitting: Physical Therapy
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
## 2015-08-07 ENCOUNTER — Encounter: Payer: 59 | Admitting: Physical Therapy

## 2015-08-08 ENCOUNTER — Ambulatory Visit: Payer: 59 | Admitting: Physical Therapy

## 2015-08-08 DIAGNOSIS — R2689 Other abnormalities of gait and mobility: Secondary | ICD-10-CM | POA: Diagnosis not present

## 2015-08-08 DIAGNOSIS — R2681 Unsteadiness on feet: Secondary | ICD-10-CM

## 2015-08-08 DIAGNOSIS — R29898 Other symptoms and signs involving the musculoskeletal system: Secondary | ICD-10-CM
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
## 2015-08-10 ENCOUNTER — Encounter: Payer: Self-pay | Admitting: Physical Therapy

## 2016-02-08 ENCOUNTER — Encounter (INDEPENDENT_AMBULATORY_CARE_PROVIDER_SITE_OTHER): Payer: PRIVATE HEALTH INSURANCE

## 2016-02-08 DIAGNOSIS — Z0279 Encounter for issue of other medical certificate: Secondary | ICD-10-CM

## 2016-06-27 ENCOUNTER — Encounter: Payer: Self-pay | Admitting: Vascular Surgery
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
## 2016-07-09 ENCOUNTER — Ambulatory Visit (INDEPENDENT_AMBULATORY_CARE_PROVIDER_SITE_OTHER): Payer: 59 | Admitting: Vascular Surgery

## 2016-07-09 ENCOUNTER — Encounter: Payer: Self-pay | Admitting: Vascular Surgery

## 2016-07-09 VITALS — BP 115/73 | HR 69 | Temp 98.4°F | Resp 16 | Ht 73.0 in | Wt 190.0 lb

## 2016-07-09 DIAGNOSIS — S75002D Unspecified injury of femoral artery, left leg, subsequent encounter: Secondary | ICD-10-CM

## 2016-07-09 DIAGNOSIS — Z89612 Acquired absence of left leg above knee: Secondary | ICD-10-CM

## 2016-07-09 DIAGNOSIS — S75102D Unspecified injury of femoral vein at hip and thigh level, left leg, subsequent encounter: Secondary | ICD-10-CM | POA: Diagnosis not present
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
## 2016-10-23 IMAGING — CR DG FOREARM 2V*R*
2 series · 2 of 2 positions shown · non-contrast
Comparison: No priors.

CLINICAL DATA: 24-year-old male with history of fall three days ago
complaining of right forearm pain.

EXAM:
RIGHT FOREARM - 2 VIEW

[AP]
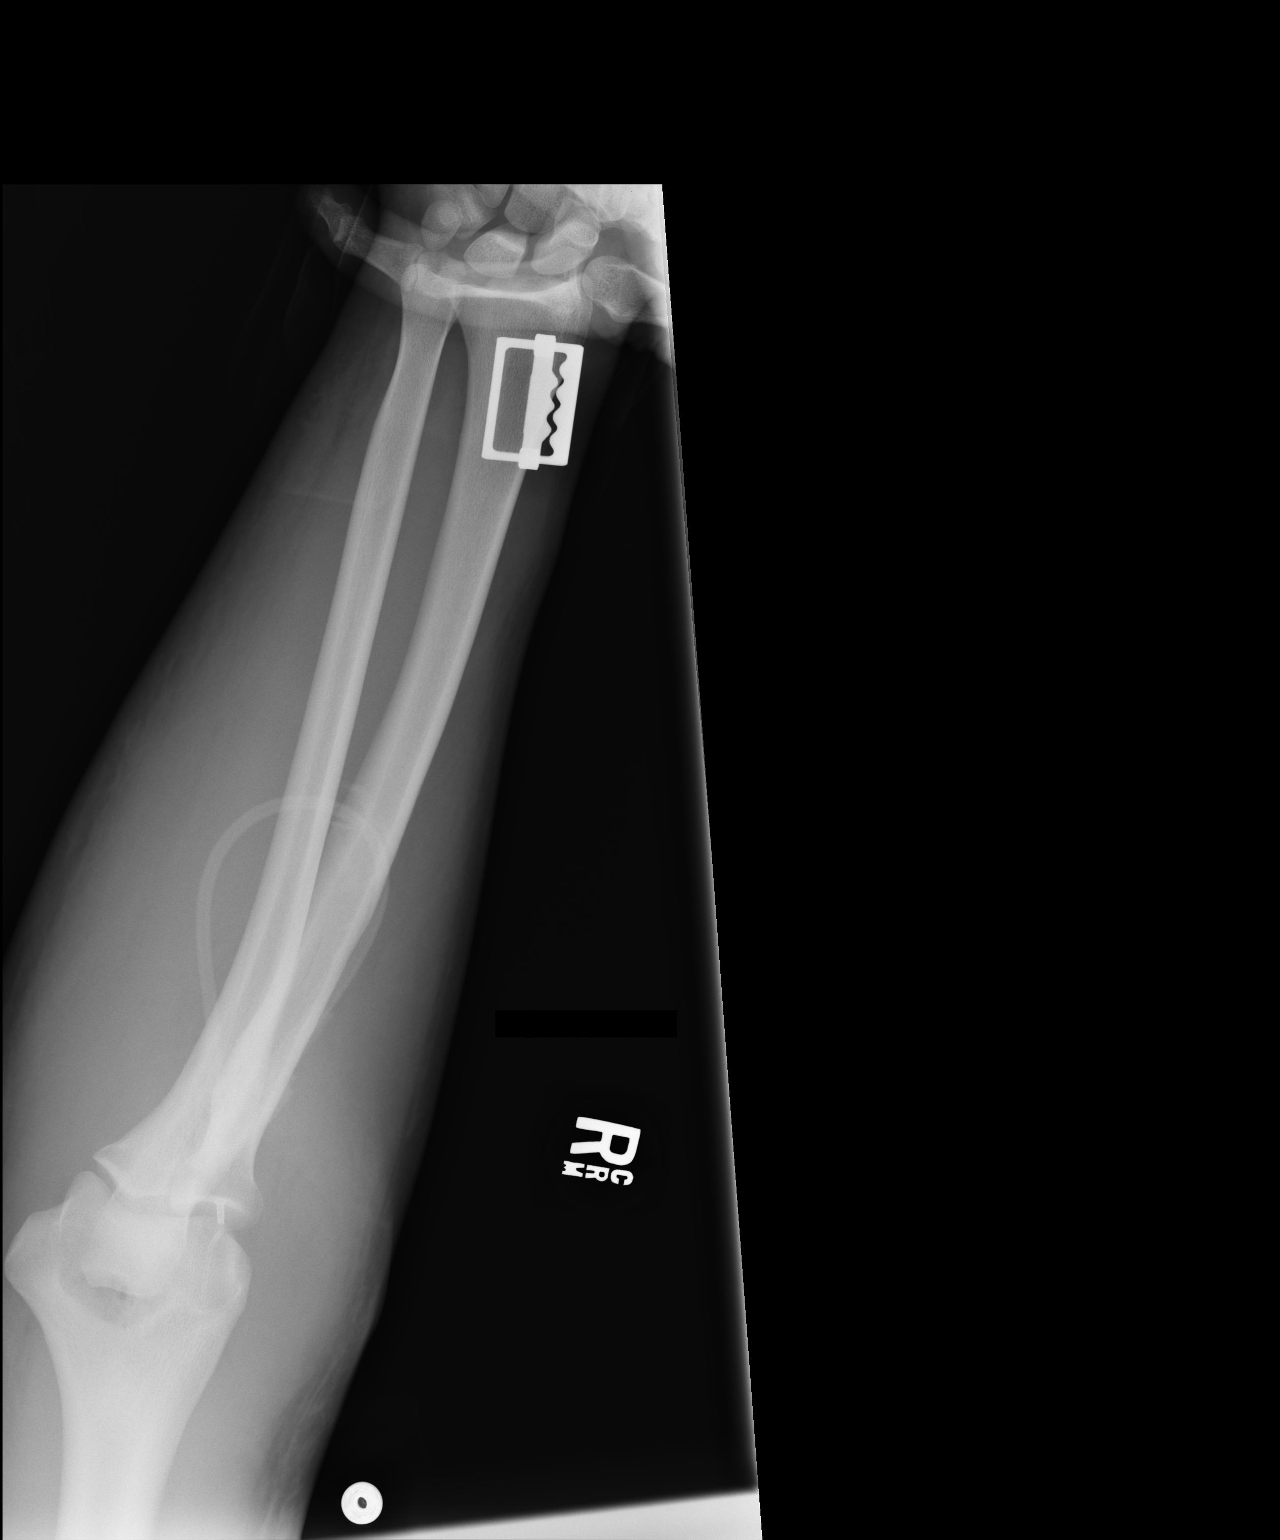

[lateral]
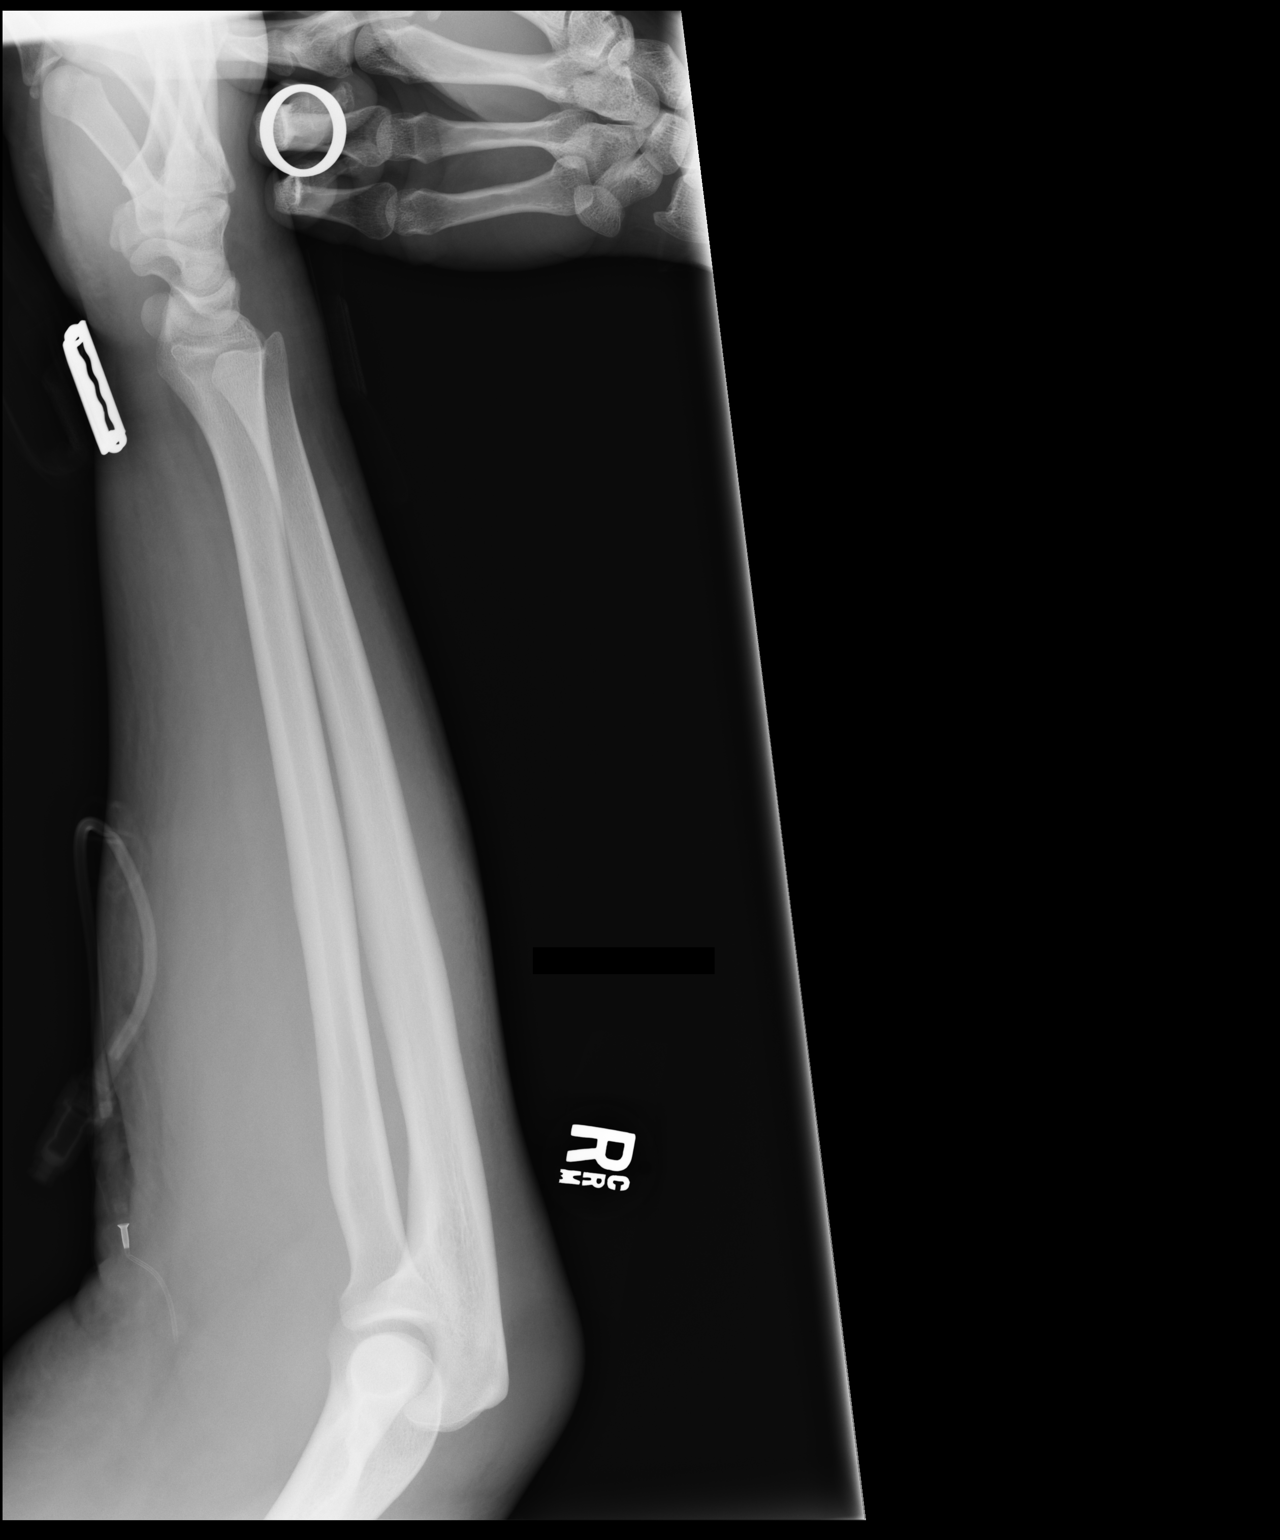

[2 of 2 positions shown; findings below may reference images not displayed]

FINDINGS: Two views of the right forearm demonstrate no acute displaced
fracture of either radius or ulna. Soft tissue swelling overlying
the olecranon.
IMPRESSION: 1. No acute radiographic abnormality of the radius or ulna.

## 2016-10-25 IMAGING — CR DG CHEST 1V PORT
1 series · 1 of 1 positions shown · non-contrast
Comparison: November 27, 2014

CLINICAL DATA: Hypoxia

EXAM:
PORTABLE CHEST - 1 VIEW

[AP]
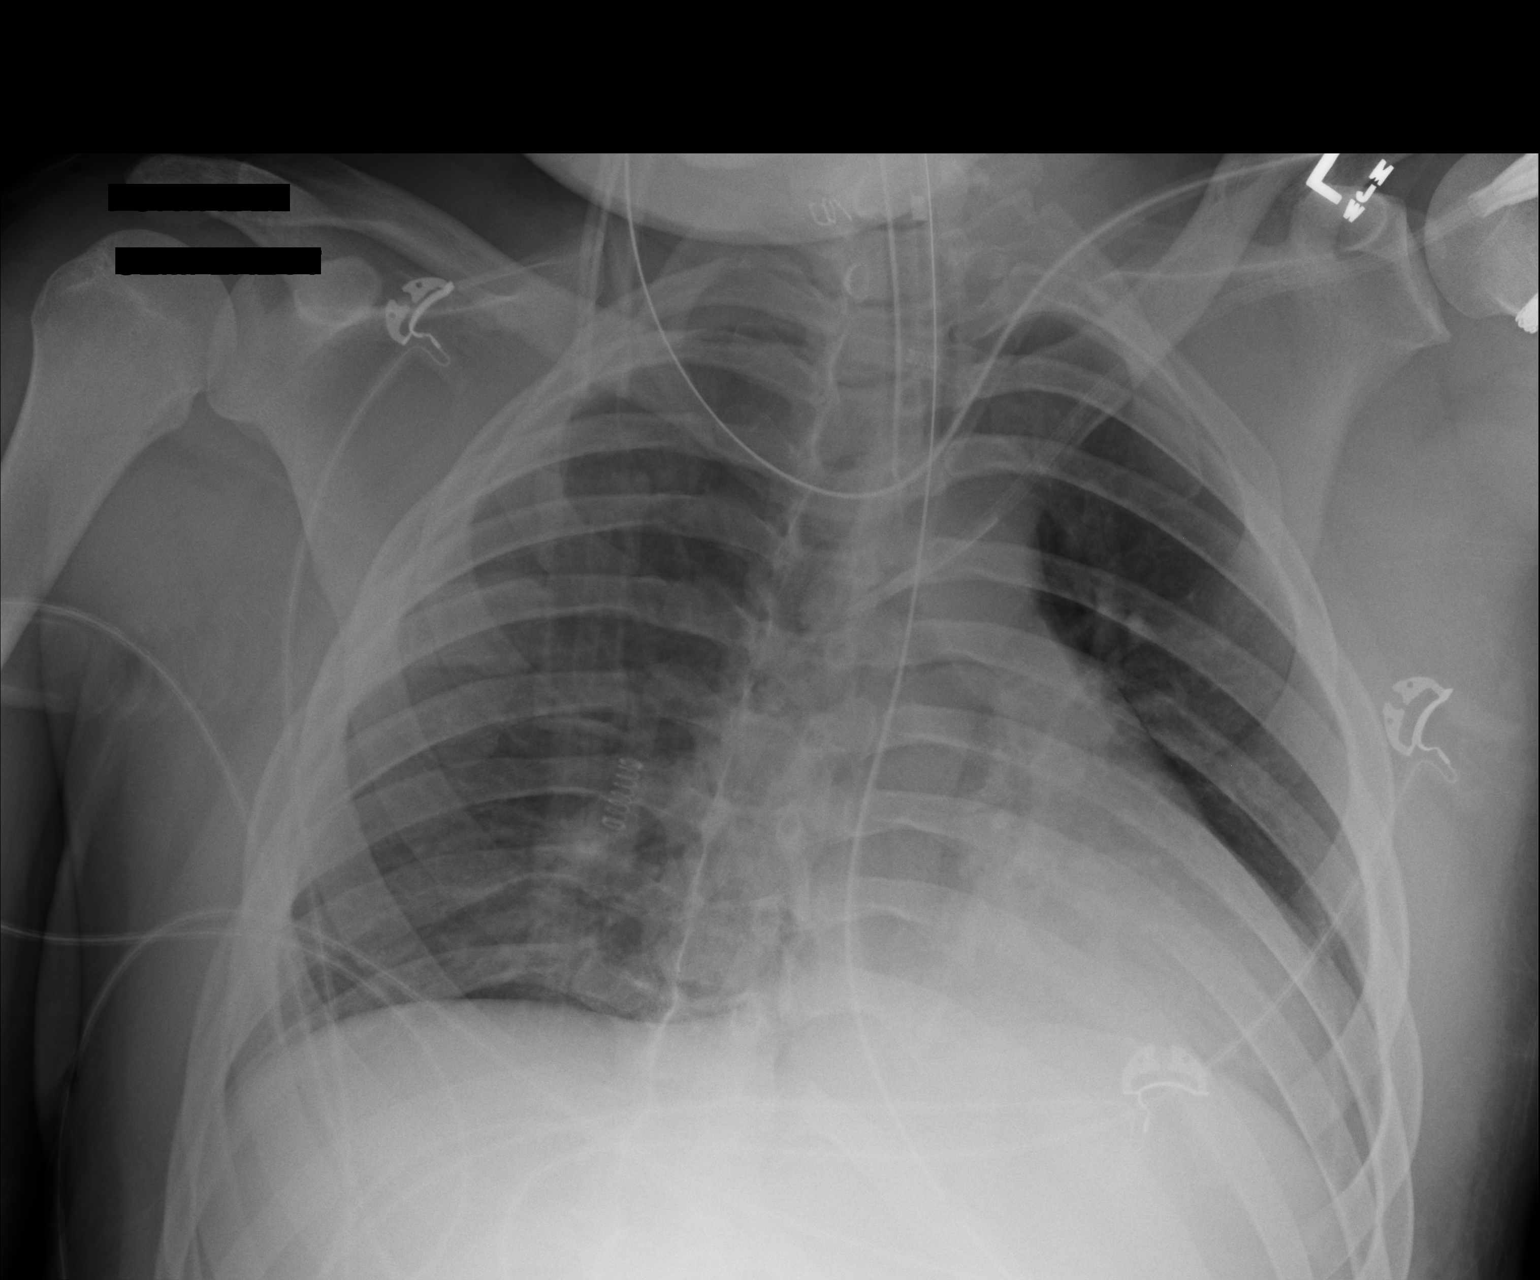

[1 of 1 positions shown; findings below may reference images not displayed]

FINDINGS: Endotracheal tube tip is 3.8 cm above the carina. Central catheter
tip is in the left innominate vein. Nasogastric tube tip and side
port are below the diaphragm. No pneumothorax. There is atelectasis
in the left base. Lungs elsewhere clear. Heart size and pulmonary
vascularity are normal. No adenopathy.
IMPRESSION: Tube and catheter positions as described without pneumothorax.
Atelectasis left base. Lungs elsewhere clear. Cardiac silhouette
within normal limits.

## 2016-12-16 ENCOUNTER — Encounter: Payer: Self-pay | Admitting: Physical Therapy

## 2016-12-16 ENCOUNTER — Ambulatory Visit: Payer: 59 | Attending: Vascular Surgery | Admitting: Physical Therapy

## 2016-12-16 DIAGNOSIS — R29898 Other symptoms and signs involving the musculoskeletal system: Secondary | ICD-10-CM | POA: Insufficient documentation

## 2016-12-16 DIAGNOSIS — R2689 Other abnormalities of gait and mobility: Secondary | ICD-10-CM | POA: Diagnosis not present

## 2016-12-16 DIAGNOSIS — R2681 Unsteadiness on feet: Secondary | ICD-10-CM | POA: Diagnosis present
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
## 2016-12-23 ENCOUNTER — Encounter: Payer: Self-pay | Admitting: Physical Therapy

## 2016-12-23 ENCOUNTER — Ambulatory Visit: Payer: 59 | Attending: Vascular Surgery | Admitting: Physical Therapy

## 2016-12-23 DIAGNOSIS — R29898 Other symptoms and signs involving the musculoskeletal system: Secondary | ICD-10-CM | POA: Insufficient documentation

## 2016-12-23 DIAGNOSIS — R269 Unspecified abnormalities of gait and mobility: Secondary | ICD-10-CM | POA: Diagnosis present

## 2016-12-23 DIAGNOSIS — R2681 Unsteadiness on feet: Secondary | ICD-10-CM | POA: Diagnosis present

## 2016-12-23 DIAGNOSIS — R2689 Other abnormalities of gait and mobility: Secondary | ICD-10-CM | POA: Diagnosis present
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
## 2017-01-01 ENCOUNTER — Ambulatory Visit: Payer: 59 | Admitting: Physical Therapy

## 2017-01-07 ENCOUNTER — Encounter: Payer: Self-pay | Admitting: Physical Therapy

## 2017-01-07 ENCOUNTER — Ambulatory Visit: Payer: 59 | Admitting: Physical Therapy

## 2017-01-07 DIAGNOSIS — R2689 Other abnormalities of gait and mobility: Secondary | ICD-10-CM | POA: Diagnosis not present

## 2017-01-07 DIAGNOSIS — R29898 Other symptoms and signs involving the musculoskeletal system: Secondary | ICD-10-CM

## 2017-01-07 DIAGNOSIS — R2681 Unsteadiness on feet: Secondary | ICD-10-CM

## 2017-01-07 DIAGNOSIS — R269 Unspecified abnormalities of gait and mobility: Secondary | ICD-10-CM
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
## 2017-01-15 ENCOUNTER — Ambulatory Visit: Payer: 59 | Admitting: Physical Therapy

## 2017-01-22 ENCOUNTER — Ambulatory Visit: Payer: 59 | Admitting: Physical Therapy

## 2017-01-29 ENCOUNTER — Ambulatory Visit: Payer: 59 | Attending: Vascular Surgery | Admitting: Physical Therapy

## 2017-01-29 ENCOUNTER — Encounter: Payer: Self-pay | Admitting: Physical Therapy

## 2017-01-29 DIAGNOSIS — R2689 Other abnormalities of gait and mobility: Secondary | ICD-10-CM | POA: Insufficient documentation

## 2017-01-29 DIAGNOSIS — R2681 Unsteadiness on feet: Secondary | ICD-10-CM | POA: Insufficient documentation
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
## 2017-02-05 ENCOUNTER — Encounter: Payer: Self-pay | Admitting: Physical Therapy

## 2017-02-12 ENCOUNTER — Encounter: Payer: Self-pay | Admitting: Physical Therapy

## 2017-02-19 ENCOUNTER — Encounter: Payer: Self-pay | Admitting: Physical Therapy

## 2017-02-26 ENCOUNTER — Encounter: Payer: Self-pay | Admitting: Physical Therapy

## 2021-09-27 ENCOUNTER — Ambulatory Visit (INDEPENDENT_AMBULATORY_CARE_PROVIDER_SITE_OTHER): Payer: Self-pay | Admitting: Orthopedic Surgery

## 2021-09-27 DIAGNOSIS — S78112A Complete traumatic amputation at level between left hip and knee, initial encounter: Secondary | ICD-10-CM

## 2021-09-27 DIAGNOSIS — Z89612 Acquired absence of left leg above knee: Secondary | ICD-10-CM

## 2021-10-16 ENCOUNTER — Encounter: Payer: Self-pay | Admitting: Orthopedic Surgery
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
## 2022-07-15 ENCOUNTER — Ambulatory Visit
Admission: EM | Admit: 2022-07-15 | Discharge: 2022-07-15 | Disposition: A | Discharge status: Home / Self Care | Payer: Worker's Compensation | Attending: Emergency Medicine | Admitting: Emergency Medicine

## 2022-07-15 ENCOUNTER — Encounter: Payer: Self-pay | Admitting: Emergency Medicine

## 2022-07-15 DIAGNOSIS — S61211A Laceration without foreign body of left index finger without damage to nail, initial encounter: Secondary | ICD-10-CM

## 2022-07-15 MED ORDER — TETANUS-DIPHTH-ACELL PERTUSSIS 5-2.5-18.5 LF-MCG/0.5 IM SUSY
0.5000 mL | PREFILLED_SYRINGE | Freq: Once | INTRAMUSCULAR | Status: AC
  Administered 2022-07-15: 0.5 mL via INTRAMUSCULAR

## 2022-07-15 NOTE — Discharge Instructions (Addendum)
Laceration is  superficial, has been cleaned here in our office and adhered by skin adhesive skin adhesive will fall off with time, do not peel and allow this to occur naturally  May cleanse area daily during normal hygiene with soap and water, pat dry and cover with a nonstick Band-Aid if at risk for becoming infected, may apply topical antibiotic ointment if you would like to do so  Please watch for any signs of infection such as increased swelling, increased pain, redness, puslike drainage, if this occurs at any point please return to urgent care for reevaluation  You have been given a tetanus booster here in office  Please complete all Worker's Compensation paperwork with the health at work department, information is on front page
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
## 2022-07-15 NOTE — ED Triage Notes (Signed)
Pt has a cut to his left pointer finger with a box cutter to at work.

## 2022-09-05 ENCOUNTER — Ambulatory Visit (INDEPENDENT_AMBULATORY_CARE_PROVIDER_SITE_OTHER): Payer: Self-pay | Admitting: Orthopedic Surgery

## 2022-09-05 ENCOUNTER — Encounter: Payer: Self-pay | Admitting: Orthopedic Surgery

## 2022-09-05 DIAGNOSIS — S78112A Complete traumatic amputation at level between left hip and knee, initial encounter: Secondary | ICD-10-CM

## 2022-09-05 DIAGNOSIS — Z89612 Acquired absence of left leg above knee: Secondary | ICD-10-CM
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
## 2022-09-20 ENCOUNTER — Telehealth: Payer: Self-pay | Admitting: Orthopedic Surgery

## 2022-09-20 NOTE — Telephone Encounter (Signed)
Patient requesting a note to be out of work today due to leg hurting being standing and walking on it for extended amounts of time, pt is working 40 hours a week.

## 2022-09-23 NOTE — Telephone Encounter (Signed)
Pt was told at the front desk when he walked into our office requesting this that he would need an appt if he is having issues with working at this time. Being out of work was not discussed at last OV. If things have changed he needs to be seen again. We cannot write him out of work for a certain amount of time w/o seeing him and having documentation.

## 2022-12-12 ENCOUNTER — Ambulatory Visit: Payer: Medicaid Other | Admitting: Orthopedic Surgery

## 2022-12-23 ENCOUNTER — Ambulatory Visit: Payer: Medicaid Other | Admitting: Orthopedic Surgery

## 2023-02-26 ENCOUNTER — Ambulatory Visit (INDEPENDENT_AMBULATORY_CARE_PROVIDER_SITE_OTHER): Payer: Self-pay | Admitting: Family

## 2023-02-26 ENCOUNTER — Encounter: Payer: Self-pay | Admitting: Family

## 2023-02-26 DIAGNOSIS — Z89612 Acquired absence of left leg above knee: Secondary | ICD-10-CM

## 2023-02-26 DIAGNOSIS — S78112A Complete traumatic amputation at level between left hip and knee, initial encounter: Secondary | ICD-10-CM
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
## 2023-02-27 ENCOUNTER — Telehealth: Payer: Self-pay | Admitting: Orthopedic Surgery

## 2023-02-27 NOTE — Telephone Encounter (Signed)
Pt called requesting a letter for light duty. Please call pt about this matter at [phone].

## 2023-02-28 ENCOUNTER — Other Ambulatory Visit: Payer: Self-pay

## 2023-02-28 NOTE — Telephone Encounter (Signed)
Ok per Denny Peon to write note ok to sit 15 min every 2 hours while standing and walking at work. LM ON VM to advise that this letter is ready for pick up at the front desk.

## 2024-01-26 ENCOUNTER — Encounter: Payer: Self-pay | Admitting: Radiology
